# Patient Record
Sex: Female | Born: 1940 | Race: White | Hispanic: No | Smoking: Never smoker
Health system: Southern US, Community
[De-identification: ages and names within clinical notes are randomized; demographics above are authoritative.]

## PROBLEM LIST (undated history)

## (undated) DIAGNOSIS — R112 Nausea with vomiting, unspecified: Secondary | ICD-10-CM

## (undated) DIAGNOSIS — J189 Pneumonia, unspecified organism: Secondary | ICD-10-CM

## (undated) DIAGNOSIS — C50919 Malignant neoplasm of unspecified site of unspecified female breast: Secondary | ICD-10-CM

## (undated) DIAGNOSIS — R32 Unspecified urinary incontinence: Secondary | ICD-10-CM

## (undated) DIAGNOSIS — E039 Hypothyroidism, unspecified: Secondary | ICD-10-CM

## (undated) DIAGNOSIS — J40 Bronchitis, not specified as acute or chronic: Secondary | ICD-10-CM

## (undated) DIAGNOSIS — R040 Epistaxis: Secondary | ICD-10-CM

## (undated) DIAGNOSIS — Z8042 Family history of malignant neoplasm of prostate: Secondary | ICD-10-CM

## (undated) DIAGNOSIS — R0602 Shortness of breath: Secondary | ICD-10-CM

## (undated) DIAGNOSIS — J302 Other seasonal allergic rhinitis: Secondary | ICD-10-CM

## (undated) DIAGNOSIS — Z8051 Family history of malignant neoplasm of kidney: Secondary | ICD-10-CM

## (undated) DIAGNOSIS — K219 Gastro-esophageal reflux disease without esophagitis: Secondary | ICD-10-CM

## (undated) DIAGNOSIS — Z803 Family history of malignant neoplasm of breast: Secondary | ICD-10-CM

## (undated) DIAGNOSIS — Z9889 Other specified postprocedural states: Secondary | ICD-10-CM

## (undated) DIAGNOSIS — E7800 Pure hypercholesterolemia, unspecified: Secondary | ICD-10-CM

## (undated) DIAGNOSIS — E079 Disorder of thyroid, unspecified: Secondary | ICD-10-CM

## (undated) DIAGNOSIS — Z923 Personal history of irradiation: Secondary | ICD-10-CM

## (undated) DIAGNOSIS — Z973 Presence of spectacles and contact lenses: Secondary | ICD-10-CM

## (undated) DIAGNOSIS — Z79810 Long term (current) use of selective estrogen receptor modulators (SERMs): Secondary | ICD-10-CM

## (undated) DIAGNOSIS — I10 Essential (primary) hypertension: Secondary | ICD-10-CM

## (undated) DIAGNOSIS — M7989 Other specified soft tissue disorders: Secondary | ICD-10-CM

## (undated) DIAGNOSIS — M1990 Unspecified osteoarthritis, unspecified site: Secondary | ICD-10-CM

## (undated) HISTORY — DX: Personal history of irradiation: Z92.3

## (undated) HISTORY — DX: Malignant neoplasm of unspecified site of unspecified female breast: C50.919

## (undated) HISTORY — DX: Family history of malignant neoplasm of kidney: Z80.51

## (undated) HISTORY — PX: COLONOSCOPY: SHX174

## (undated) HISTORY — DX: Family history of malignant neoplasm of breast: Z80.3

## (undated) HISTORY — DX: Long term (current) use of selective estrogen receptor modulators (serms): Z79.810

## (undated) HISTORY — PX: OTHER SURGICAL HISTORY: SHX169

## (undated) HISTORY — PX: UPPER GI ENDOSCOPY: SHX6162

## (undated) HISTORY — DX: Family history of malignant neoplasm of prostate: Z80.42

---

## 1975-04-15 HISTORY — PX: ABDOMINAL HYSTERECTOMY: SHX81

## 1995-04-01 HISTORY — PX: NASAL SEPTUM SURGERY: SHX37

## 1996-06-21 HISTORY — PX: CHOLECYSTECTOMY: SHX55

## 2000-09-08 ENCOUNTER — Emergency Department (HOSPITAL_COMMUNITY): Admission: EM | Admit: 2000-09-08 | Discharge: 2000-09-08 | Payer: Self-pay | Admitting: Emergency Medicine

## 2000-09-08 ENCOUNTER — Encounter: Payer: Self-pay | Admitting: Emergency Medicine

## 2001-03-05 ENCOUNTER — Ambulatory Visit (HOSPITAL_COMMUNITY): Admission: RE | Admit: 2001-03-05 | Discharge: 2001-03-05 | Payer: Self-pay | Admitting: Gastroenterology

## 2002-04-11 HISTORY — PX: KNEE ARTHROSCOPY: SUR90

## 2005-03-17 ENCOUNTER — Encounter: Admission: RE | Admit: 2005-03-17 | Discharge: 2005-03-17 | Payer: Self-pay | Admitting: Internal Medicine

## 2005-04-18 ENCOUNTER — Encounter: Admission: RE | Admit: 2005-04-18 | Discharge: 2005-04-18 | Payer: Self-pay | Admitting: Internal Medicine

## 2010-05-10 ENCOUNTER — Other Ambulatory Visit: Payer: Self-pay | Admitting: Gastroenterology

## 2011-04-10 ENCOUNTER — Encounter (INDEPENDENT_AMBULATORY_CARE_PROVIDER_SITE_OTHER): Payer: BLUE CROSS/BLUE SHIELD | Admitting: Cardiology

## 2011-04-10 ENCOUNTER — Other Ambulatory Visit: Payer: Self-pay | Admitting: Cardiology

## 2011-04-10 DIAGNOSIS — M79609 Pain in unspecified limb: Secondary | ICD-10-CM

## 2011-04-10 DIAGNOSIS — R229 Localized swelling, mass and lump, unspecified: Secondary | ICD-10-CM

## 2011-04-10 DIAGNOSIS — M7989 Other specified soft tissue disorders: Secondary | ICD-10-CM

## 2011-07-30 ENCOUNTER — Other Ambulatory Visit: Payer: Self-pay | Admitting: Internal Medicine

## 2011-07-30 DIAGNOSIS — N9489 Other specified conditions associated with female genital organs and menstrual cycle: Secondary | ICD-10-CM

## 2011-07-31 ENCOUNTER — Ambulatory Visit
Admission: RE | Admit: 2011-07-31 | Discharge: 2011-07-31 | Disposition: A | Discharge status: Home / Self Care | Payer: BLUE CROSS/BLUE SHIELD | Source: Ambulatory Visit | Attending: Internal Medicine | Admitting: Internal Medicine

## 2011-07-31 DIAGNOSIS — N9489 Other specified conditions associated with female genital organs and menstrual cycle: Secondary | ICD-10-CM

## 2012-03-18 ENCOUNTER — Encounter (HOSPITAL_COMMUNITY): Payer: Self-pay | Admitting: *Deleted

## 2012-03-18 ENCOUNTER — Emergency Department (HOSPITAL_COMMUNITY)
Admission: EM | Admit: 2012-03-18 | Discharge: 2012-03-18 | Disposition: A | Discharge status: Home / Self Care | Payer: Medicare Other | Attending: Emergency Medicine | Admitting: Emergency Medicine

## 2012-03-18 DIAGNOSIS — R040 Epistaxis: Secondary | ICD-10-CM

## 2012-03-18 DIAGNOSIS — Z79899 Other long term (current) drug therapy: Secondary | ICD-10-CM | POA: Insufficient documentation

## 2012-03-18 DIAGNOSIS — K219 Gastro-esophageal reflux disease without esophagitis: Secondary | ICD-10-CM | POA: Insufficient documentation

## 2012-03-18 DIAGNOSIS — E079 Disorder of thyroid, unspecified: Secondary | ICD-10-CM | POA: Insufficient documentation

## 2012-03-18 DIAGNOSIS — Z794 Long term (current) use of insulin: Secondary | ICD-10-CM | POA: Insufficient documentation

## 2012-03-18 DIAGNOSIS — E7800 Pure hypercholesterolemia, unspecified: Secondary | ICD-10-CM | POA: Insufficient documentation

## 2012-03-18 DIAGNOSIS — J40 Bronchitis, not specified as acute or chronic: Secondary | ICD-10-CM | POA: Insufficient documentation

## 2012-03-18 HISTORY — DX: Gastro-esophageal reflux disease without esophagitis: K21.9

## 2012-03-18 HISTORY — DX: Pure hypercholesterolemia, unspecified: E78.00

## 2012-03-18 HISTORY — DX: Bronchitis, not specified as acute or chronic: J40

## 2012-03-18 HISTORY — DX: Disorder of thyroid, unspecified: E07.9

## 2012-03-18 MED ORDER — PHENYLEPHRINE HCL 0.5 % NA SOLN
1.0000 [drp] | Freq: Once | NASAL | Status: AC
  Administered 2012-03-18: 1 [drp] via NASAL
  Filled 2012-03-18: qty 15

## 2012-03-18 NOTE — ED Notes (Signed)
Pt has nosebleed from right nare. States has been having frequent nosebleeds recently. Pt reports using aspirin 81mg  daily. Family also reports that pt has been coughing and blowing nose frequently. Pt has constant drip of blood from nose.

## 2012-03-18 NOTE — ED Provider Notes (Signed)
History     CSN: [id number]  Arrival date & time 03/18/12  1330   First MD Initiated Contact with Patient 03/18/12 1603      Chief Complaint  Patient presents with  . Epistaxis    HPI  The patient presents with concerns of epistaxis.  The past days she has had multiple events, and today he notes that she had sustained bleeding from her right nares for the past several hours.  She denies other focal complaints.  There's been no relief with pressure.  Past Medical History  Diagnosis Date  . Bronchitis   . Thyroid disease   . Acid reflux disease   . High cholesterol     Past Surgical History  Procedure Date  . Abdominal hysterectomy   . Cholecystectomy   . Upper gi endoscopy   . Colonoscopy     History reviewed. No pertinent family history.  History  Substance Use Topics  . Smoking status: Not on file  . Smokeless tobacco: Not on file  . Alcohol Use: No    OB History    Grav Para Term Preterm Abortions TAB SAB Ect Mult Living                  Review of Systems  All other systems reviewed and are negative.    Allergies  Review of patient's allergies indicates no known allergies.  Home Medications   Current Outpatient Rx  Name  Route  Sig  Dispense  Refill  . ASPIRIN 81 MG PO TABS   Oral   Take 81 mg by mouth daily.         . B COMPLEX PO TABS   Oral   Take 1 tablet by mouth daily.         . BECLOMETHASONE DIPROPIONATE 40 MCG/ACT IN AERS   Inhalation   Inhale 2 puffs into the lungs 2 (two) times daily.         Marland Kitchen DOXYCYCLINE HYCLATE 100 MG PO CAPS   Oral   Take 100 mg by mouth 2 (two) times daily.         Marland Kitchen FLUTICASONE PROPIONATE 50 MCG/ACT NA SUSP   Nasal   Place 2 sprays into the nose daily.         Marland Kitchen GABAPENTIN 100 MG PO CAPS   Oral   Take 100 mg by mouth as needed. Nerve pain         . LEVOTHYROXINE SODIUM 75 MCG PO TABS   Oral   Take 75 mcg by mouth daily.         Marland Kitchen LORATADINE 10 MG PO TABS   Oral   Take 10 mg by  mouth daily.         Marland Kitchen PANTOPRAZOLE SODIUM 40 MG PO TBEC   Oral   Take 40 mg by mouth daily.         Marland Kitchen PREDNISONE (PAK) 10 MG PO TABS   Oral   Take by mouth daily.         Marland Kitchen ROSUVASTATIN CALCIUM 10 MG PO TABS   Oral   Take 10 mg by mouth daily.         Marland Kitchen VALACYCLOVIR HCL 500 MG PO TABS   Oral   Take 500 mg by mouth 2 (two) times daily.         Marland Kitchen VITAMIN D (ERGOCALCIFEROL) 50000 UNITS PO CAPS   Oral   Take 50,000 Units by mouth every 7 (seven) days.  Pt takes on Wed           BP 171/94  Pulse 92  Temp 97.3 F (36.3 C) (Oral)  Resp 18  SpO2 100%  Physical Exam  Nursing note and vitals reviewed. Constitutional: She is oriented to person, place, and time. She appears well-developed and well-nourished. No distress.  HENT:  Head: Normocephalic and atraumatic.  Nose:    Eyes: Conjunctivae normal and EOM are normal.  Cardiovascular: Normal rate and regular rhythm.   Pulmonary/Chest: Effort normal and breath sounds normal. No stridor. No respiratory distress.  Abdominal: She exhibits no distension.  Musculoskeletal: She exhibits no edema.  Neurological: She is alert and oriented to person, place, and time. No cranial nerve deficit.  Skin: Skin is warm and dry.  Psychiatric: She has a normal mood and affect.    ED Course  EPISTAXIS MANAGEMENT Date/Time: 03/18/2012 6:10 PM Performed by: Gerhard Munch Authorized by: Gerhard Munch Consent: Verbal consent obtained. The procedure was performed in an emergent situation. Risks and benefits: risks, benefits and alternatives were discussed Consent given by: patient Patient identity confirmed: verbally with patient Time out: Immediately prior to procedure a "time out" was called to verify the correct patient, procedure, equipment, support staff and site/side marked as required. Preparation: Patient was prepped and draped in the usual sterile fashion. Patient sedated: no Treatment site: right Kiesselbach's  area Repair method: silver nitrate (pseudoephedrine) Post-procedure assessment: bleeding stopped Treatment complexity: simple Patient tolerance: Patient tolerated the procedure well with no immediate complications.   (including critical care time)  Labs Reviewed - No data to display No results found.   No diagnosis found.  5:24 PM After an initial packing w pseudoephedrine, bleeding was persistent.  Silver nitrate slowed the bleeding, but did not stop it.  Additional packing applied  6:10 PM On removal of packing there is no active bleeding.    MDM  This generally well F p/w epistaxis.  Following treatment bleeding stopped.  No additional complaints.  Return precautions and ENT followup discussed.   Gerhard Munch, MD 03/18/12 [phone]

## 2012-06-24 ENCOUNTER — Other Ambulatory Visit: Payer: Self-pay | Admitting: Radiology

## 2012-06-24 DIAGNOSIS — C50919 Malignant neoplasm of unspecified site of unspecified female breast: Secondary | ICD-10-CM

## 2012-06-24 HISTORY — DX: Malignant neoplasm of unspecified site of unspecified female breast: C50.919

## 2012-06-28 ENCOUNTER — Other Ambulatory Visit: Payer: Self-pay | Admitting: Radiology

## 2012-06-28 DIAGNOSIS — D0501 Lobular carcinoma in situ of right breast: Secondary | ICD-10-CM

## 2012-07-01 ENCOUNTER — Telehealth: Payer: Self-pay | Admitting: *Deleted

## 2012-07-01 DIAGNOSIS — C50311 Malignant neoplasm of lower-inner quadrant of right female breast: Secondary | ICD-10-CM

## 2012-07-01 NOTE — Telephone Encounter (Signed)
Confirmed [facility] for 07/07/12 at 1200 .  Instructions and contact information given.

## 2012-07-02 ENCOUNTER — Ambulatory Visit
Admission: RE | Admit: 2012-07-02 | Discharge: 2012-07-02 | Disposition: A | Discharge status: Home / Self Care | Payer: Medicare Other | Source: Ambulatory Visit | Attending: Radiology | Admitting: Radiology

## 2012-07-02 DIAGNOSIS — D0501 Lobular carcinoma in situ of right breast: Secondary | ICD-10-CM

## 2012-07-02 MED ORDER — GADOBENATE DIMEGLUMINE 529 MG/ML IV SOLN
15.0000 mL | Freq: Once | INTRAVENOUS | Status: AC | PRN
  Administered 2012-07-02: 15 mL via INTRAVENOUS

## 2012-07-07 ENCOUNTER — Encounter: Payer: Self-pay | Admitting: *Deleted

## 2012-07-07 ENCOUNTER — Ambulatory Visit
Admission: RE | Admit: 2012-07-07 | Discharge: 2012-07-07 | Disposition: A | Discharge status: Home / Self Care | Payer: Medicare Other | Source: Ambulatory Visit | Attending: Radiation Oncology | Admitting: Radiation Oncology

## 2012-07-07 ENCOUNTER — Ambulatory Visit: Payer: Medicare Other | Attending: General Surgery | Admitting: Physical Therapy

## 2012-07-07 ENCOUNTER — Ambulatory Visit (HOSPITAL_BASED_OUTPATIENT_CLINIC_OR_DEPARTMENT_OTHER): Payer: Medicare Other | Admitting: General Surgery

## 2012-07-07 ENCOUNTER — Encounter: Payer: Self-pay | Admitting: Oncology

## 2012-07-07 ENCOUNTER — Ambulatory Visit: Payer: Medicare Other

## 2012-07-07 ENCOUNTER — Ambulatory Visit (HOSPITAL_BASED_OUTPATIENT_CLINIC_OR_DEPARTMENT_OTHER): Payer: Medicare Other | Admitting: Oncology

## 2012-07-07 ENCOUNTER — Encounter: Payer: Self-pay | Admitting: Radiation Oncology

## 2012-07-07 ENCOUNTER — Other Ambulatory Visit (HOSPITAL_BASED_OUTPATIENT_CLINIC_OR_DEPARTMENT_OTHER): Payer: Medicare Other

## 2012-07-07 VITALS — BP 169/78 | HR 83 | Temp 97.9°F | Resp 20 | Ht 62.0 in | Wt 161.0 lb

## 2012-07-07 DIAGNOSIS — C50919 Malignant neoplasm of unspecified site of unspecified female breast: Secondary | ICD-10-CM | POA: Insufficient documentation

## 2012-07-07 DIAGNOSIS — C50311 Malignant neoplasm of lower-inner quadrant of right female breast: Secondary | ICD-10-CM

## 2012-07-07 DIAGNOSIS — IMO0001 Reserved for inherently not codable concepts without codable children: Secondary | ICD-10-CM | POA: Insufficient documentation

## 2012-07-07 DIAGNOSIS — Z170 Estrogen receptor positive status [ER+]: Secondary | ICD-10-CM

## 2012-07-07 DIAGNOSIS — C50319 Malignant neoplasm of lower-inner quadrant of unspecified female breast: Secondary | ICD-10-CM

## 2012-07-07 DIAGNOSIS — R293 Abnormal posture: Secondary | ICD-10-CM | POA: Insufficient documentation

## 2012-07-07 DIAGNOSIS — C50911 Malignant neoplasm of unspecified site of right female breast: Secondary | ICD-10-CM

## 2012-07-07 LAB — COMPREHENSIVE METABOLIC PANEL (CC13)
ALT: 28 U/L (ref 0–55)
AST: 33 U/L (ref 5–34)
Albumin: 3.8 g/dL (ref 3.5–5.0)
Alkaline Phosphatase: 54 U/L (ref 40–150)
Calcium: 10 mg/dL (ref 8.4–10.4)
Chloride: 105 mEq/L (ref 98–107)
Potassium: 4.3 mEq/L (ref 3.5–5.1)
Sodium: 141 mEq/L (ref 136–145)

## 2012-07-07 LAB — CBC WITH DIFFERENTIAL/PLATELET
BASO%: 1.3 % (ref 0.0–2.0)
EOS%: 2.1 % (ref 0.0–7.0)
HGB: 14.6 g/dL (ref 11.6–15.9)
MCH: 31.5 pg (ref 25.1–34.0)
MCHC: 34.2 g/dL (ref 31.5–36.0)
RBC: 4.65 10*6/uL (ref 3.70–5.45)
RDW: 13.5 % (ref 11.2–14.5)
lymph#: 3 10*3/uL (ref 0.9–3.3)

## 2012-07-07 NOTE — Progress Notes (Signed)
Shelia Obrien [id number] 09-04-1940 72 y.o. 07/07/2012 3:19 PM  CC  [name], MD [address] [place] [address] Dr. Almond Lint Dr. Antony Blackbird  REASON FOR CONSULTATION:  72 year old female with new diagnosis of stage I invasive breast cancer of the right breast in the lower inner quadrant.  STAGE:   Cancer of lower-inner quadrant of female breast   Primary site: Breast (Right)   Staging method: AJCC 7th Edition   Clinical: Stage IA (T1c, N0, cM0)   Summary: Stage IA (T1c, N0, cM0)  REFERRING PHYSICIAN: Dr. Everardo Beals  HISTORY OF PRESENT ILLNESS:  Shelia Obrien is a 72 y.o. female. who is seen as part of the multidisciplinary breast clinic. The patient presented recently with some architectural distortion noted on a screening mammogram. An ultrasound of this area was performed which revealed a 1.2 cm mass in the 4:00 position of the right breast. An MRI was performed which is documented below and also confirmed a solitary lesion which measured 1.6 cm. 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 The tumor was estrogen receptor positive at 93% and progesterone receptor positive at 7%. Ki 67 was low at 12%. There was no HER-2/neu amplification.patient's case was discussed at the multidisciplinary breast conference. Her radiology and pathology were reviewed. She is seen today in collaboration with Dr. Everardo Beals and Dr. Antony Blackbird.   Past Medical History: Past Medical History  Diagnosis Date  . Bronchitis   . Thyroid disease   . Acid reflux disease   . High cholesterol   . Breast cancer     Past Surgical History: Past Surgical History  Procedure Laterality Date  . Abdominal hysterectomy    . Cholecystectomy    . Upper gi endoscopy    . Colonoscopy      Family History: Family History  Problem Relation Age of Onset  . Prostate cancer Father   . Breast cancer Maternal Aunt   . Lung cancer  Maternal Uncle   . Lung cancer Maternal Grandmother   . Kidney cancer Maternal Grandmother     Social History History  Substance Use Topics  . Smoking status: Never Smoker   . Smokeless tobacco: Not on file  . Alcohol Use: No    Allergies: No Known Allergies  Current Medications: Current Outpatient Prescriptions  Medication Sig Dispense Refill  . b complex vitamins tablet Take 1 tablet by mouth daily.      Marland Kitchen estradiol (CLIMARA - DOSED IN MG/24 HR) 0.0375 mg/24hr Place 1 patch onto the skin once a week.      . fluticasone (FLONASE) 50 MCG/ACT nasal spray Place 2 sprays into the nose daily.      Marland Kitchen gabapentin (NEURONTIN) 100 MG capsule Take 100 mg by mouth as needed. Nerve pain      . levothyroxine (SYNTHROID, LEVOTHROID) 75 MCG tablet Take 75 mcg by mouth daily.      . pantoprazole (PROTONIX) 40 MG tablet Take 40 mg by mouth daily.      . rosuvastatin (CRESTOR) 10 MG tablet Take 10 mg by mouth daily.      . valACYclovir (VALTREX) 500 MG tablet Take 1 g by mouth as needed.       Marland Kitchen aspirin 81 MG tablet Take 81 mg by mouth daily.      . beclomethasone (QVAR) 40 MCG/ACT inhaler Inhale 2 puffs into the lungs 2 (two) times daily.      Marland Kitchen  doxycycline (VIBRAMYCIN) 100 MG capsule Take 100 mg by mouth 2 (two) times daily.      Marland Kitchen loratadine (CLARITIN) 10 MG tablet Take 10 mg by mouth daily.      . predniSONE (STERAPRED UNI-PAK) 10 MG tablet Take by mouth daily.      . Vitamin D, Ergocalciferol, (DRISDOL) 50000 UNITS CAPS Take 50,000 Units by mouth every 7 (seven) days. Pt takes on Wed       No current facility-administered medications for this visit.    OB/GYN History:menarche age 37 she is postmenopausal she had been on home on replacement therapy for 20 years on date of visit she was on them we recommended she discontinue both estradiol as well as Premarin.she's had to live births first live birth was at 89  Fertility Discussion:n/a Prior History of Cancer: none  Health  Maintenance:  Colonoscopy yes Bone Density yes  Last PAP smear unknown  ECOG PERFORMANCE STATUS: 0 - Asymptomatic  Genetic Counseling/testing: no  REVIEW OF SYSTEMS: Comprehensive review of systems is scanned separately   PHYSICAL EXAMINATION: Blood pressure 169/78, pulse 83, temperature 97.9 F (36.6 C), temperature source Oral, resp. rate 20, height 5\' 2"  (1.575 m), weight 161 lb (73.029 kg). Patient is well-developed well-nourished female in no acute distress she is accompanied by her son and daughter as well as a friend. HEENT exam EOMI PERRLA sclerae anicteric no conjunctival pallor oral mucosa is moist neck is supple lungs are clear bilaterally to auscultation and percussion cardiovascular is regular rate rhythm abdomen is soft nontender nondistended bowel sounds are present no HSM extremities no edema neuro patient's alert oriented otherwise nonfocal  Breast examination: Left breast no masses or nipple discharge right breast reveals mild bruising in the lower inner quadrant of the breast area some thick mucus noted but no discrete mass is palpated.  STUDIES/RESULTS: Mr Breast Bilateral W Wo Contrast  07/02/2012  *RADIOLOGY REPORT*  Clinical Data: Recently diagnosed invasive lobular carcinoma in the lower inner quadrant of the right breast.  Tiny masses suggested in the outer mid right breast on recent breast tomosynthesis images. No corresponding abnormality seen on ultrasound.  BUN and creatinine were obtained on site at [facility] at [address] Results:  BUN 14 mg/dL,  Creatinine 1.0 mg/dL.  BILATERAL BREAST MRI WITH AND WITHOUT CONTRAST  Technique: Multiplanar, multisequence MR images of both breasts were obtained prior to and following the intravenous administration of 15ml of MultiHance.  Three dimensional images were evaluated at the independent DynaCad workstation.  Comparison:  Recent mammogram, ultrasound and biopsy examinations at [facility].   Findings: Mild to moderate background parenchymal enhancement in both breasts.  1.6 x 1.2 x 0.8 cm rounded, irregularly marginated enhancing mass in the lower inner quadrant of the left breast with an adjacent metallic clip artifact.  This corresponds to the recently biopsied invasive lobular carcinoma.  This has plateau enhancement kinetics.  No additional masses or areas of enhancement suspicious for malignancy in either breast.  No abnormal appearing lymph nodes.  IMPRESSION:  1.  1.6 x 1.2 x 0.8 cm biopsy-proven invasive lobular carcinoma in the lower inner quadrant of the right breast. 2.  Otherwise, unremarkable examination.  RECOMMENDATION: Treatment plan.  THREE-DIMENSIONAL MR IMAGE RENDERING ON INDEPENDENT WORKSTATION:  Three-dimensional MR images were rendered by post-processing of the original MR data on an independent workstation.  The three- dimensional MR images were interpreted, and findings were reported in the accompanying complete MRI report for this study.  BI-RADS CATEGORY 6:  Known biopsy-proven malignancy - appropriate action should be taken.   Original Report Authenticated By: Beckie Salts, M.D.      LABS:    Chemistry   No results found for this basename: NA, K, CL, CO2, BUN, CREATININE, GLU   No results found for this basename: CALCIUM, ALKPHOS, AST, ALT, BILITOT      Lab Results  Component Value Date   WBC 6.3 07/07/2012   HGB 14.6 07/07/2012   HCT 42.7 07/07/2012   MCV 91.9 07/07/2012   PLT 247 07/07/2012   ASSESSMENT    72 year old female with  #1 new diagnosis of invasive lobular carcinoma of the right breast which is ER positive PR positive HER-2/neu negative clinical stage I.patient is seen in the multidisciplinary breast clinic for discussion of treatment options. She was seen by Dr. Everardo Beals who thinks that she is a good candidate for a right lumpectomy with sentinel lymph node biopsy. Patient desires breast conservation synthesis a good option for her.  #2  since patient's tumor is ER positive PR positive she certainly would be a candidate for antiestrogen therapy. However whether or not she needs chemotherapy will depend on Oncotype DX testing. I have recommended this for her. We also discussed the rationale and I gave her significant information for Oncotype testing.  #3 should patient be able to undergo breast conservation she will need radiation therapy and she was seen by the radiation oncologist today as well.     PLAN:    #1 patient will proceed with her lumpectomy and sentinel lymph node biopsy initially.  #2 we will send off Oncotype DX testing on her final pathology.  #3 I will plan on seeing her back after her surgery to      Thank you so much for allowing me to participate in the care of [name]. I will continue to follow up the patient with you and assist in her care.  All questions were answered. The patient knows to call the clinic with any problems, questions or concerns. We can certainly see the patient much sooner if necessary.  I spent 60 minutes counseling the patient face to face. The total time spent in the appointment was 60 minutes.   Drue Second, MD Medical/Oncology [name] [phone] (beeper) [phone] (Office)  07/07/2012, 3:19 PM

## 2012-07-07 NOTE — Progress Notes (Signed)
Radiation Oncology         ([phone]) [phone] ________________________________  Initial outpatient Consultation  Name: Shelia Obrien MRN: [id number]  Date: 07/07/2012  DOB: 10-21-1940  WU:[id number],YNWG A, MD  Almond Lint, MD   REFERRING PHYSICIAN: Almond Lint, MD  DIAGNOSIS: The encounter diagnosis was Cancer of lower-inner quadrant of female breast, right.  HISTORY OF PRESENT ILLNESS::Shelia Obrien is a 72 y.o. female who is seen out courtesy of Dr. Donell Obrien as part of the multidisciplinary breast clinic. The patient presented recently with some architectural distortion noted on a screening mammogram. An ultrasound of this area was performed which revealed a 1.2 cm mass in the 4:00 position of the right breast. An MRI was performed which is documented below and also confirmed a solitary lesion which measured 1.6 cm. 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 The tumor was estrogen receptor positive at 93% and progesterone receptor positive at 7%. Ki 67 was low at 12%. There was no HER-2/neu amplification. With these findings the patient is now seen in consultation.   PREVIOUS RADIATION THERAPY: No  PAST MEDICAL HISTORY:  has a past medical history of Bronchitis; Thyroid disease; Acid reflux disease; High cholesterol; and Breast cancer.    PAST SURGICAL HISTORY: Past Surgical History  Procedure Laterality Date  . Abdominal hysterectomy    . Cholecystectomy    . Upper gi endoscopy    . Colonoscopy      FAMILY HISTORY: family history includes Breast cancer in her maternal aunt; Kidney cancer in her maternal grandmother; Lung cancer in her maternal grandmother and maternal uncle; and Prostate cancer in her father.  SOCIAL HISTORY:  reports that she has never smoked. She does not have any smokeless tobacco history on file. She reports that she does not drink alcohol or use illicit drugs.  ALLERGIES: Review of patient's  allergies indicates no known allergies.  MEDICATIONS:  Current Outpatient Prescriptions  Medication Sig Dispense Refill  . aspirin 81 MG tablet Take 81 mg by mouth daily.      Marland Kitchen b complex vitamins tablet Take 1 tablet by mouth daily.      . beclomethasone (QVAR) 40 MCG/ACT inhaler Inhale 2 puffs into the lungs 2 (two) times daily.      Marland Kitchen doxycycline (VIBRAMYCIN) 100 MG capsule Take 100 mg by mouth 2 (two) times daily.      Marland Kitchen estradiol (CLIMARA - DOSED IN MG/24 HR) 0.0375 mg/24hr Place 1 patch onto the skin once a week.      . fluticasone (FLONASE) 50 MCG/ACT nasal spray Place 2 sprays into the nose daily.      Marland Kitchen gabapentin (NEURONTIN) 100 MG capsule Take 100 mg by mouth as needed. Nerve pain      . levothyroxine (SYNTHROID, LEVOTHROID) 75 MCG tablet Take 75 mcg by mouth daily.      Marland Kitchen loratadine (CLARITIN) 10 MG tablet Take 10 mg by mouth daily.      . pantoprazole (PROTONIX) 40 MG tablet Take 40 mg by mouth daily.      . predniSONE (STERAPRED UNI-PAK) 10 MG tablet Take by mouth daily.      . rosuvastatin (CRESTOR) 10 MG tablet Take 10 mg by mouth daily.      . valACYclovir (VALTREX) 500 MG tablet Take 1 g by mouth as needed.       . Vitamin D, Ergocalciferol, (DRISDOL) 50000 UNITS CAPS Take 50,000 Units by mouth every 7 (seven)  days. Pt takes on Wed       No current facility-administered medications for this encounter.    REVIEW OF SYSTEMS:  A 15 point review of systems is documented in the electronic medical record. This was obtained by the nursing staff. However, I reviewed this with the patient to discuss relevant findings and make appropriate changes.  Prior to diagnosis the patient denied any pain in the breast area nipple discharge or bleeding. She has been unable to palpate a mass in her breast area.   PHYSICAL EXAM: This is a very pleasant 72 year old female in no acute distress. She is accompanied by her son and daughter as well as a good friend on evaluation today. Examination of  the neck and supraclavicular region reveals no evidence of adenopathy. The axillary areas are free of adenopathy. Examination of the lungs reveals them to be clear. The heart has a regular rhythm and rate. The abdomen is soft and nontender with normal bowel sounds. The neurologic exam is nonfocal. Examination of the left breast reveals no mass or nipple discharge. Examination of the right breast area reveals some mild bruising in the lower inner quadrant of the breast area. There is some thickening noted in this area but no discrete mass is palpable.   LABORATORY DATA:  Lab Results  Component Value Date   WBC 6.3 07/07/2012   HGB 14.6 07/07/2012   HCT 42.7 07/07/2012   MCV 91.9 07/07/2012   PLT 247 07/07/2012   No results found for this basename: NA, K, CL, CO2   No results found for this basename: ALT, AST, GGT, ALKPHOS, BILITOT     RADIOGRAPHY: Mr Breast Bilateral W Wo Contrast  07/02/2012  *RADIOLOGY REPORT*  Clinical Data: Recently diagnosed invasive lobular carcinoma in the lower inner quadrant of the right breast.  Tiny masses suggested in the outer mid right breast on recent breast tomosynthesis images. No corresponding abnormality seen on ultrasound.  BUN and creatinine were obtained on site at [facility] at [address] Results:  BUN 14 mg/dL,  Creatinine 1.0 mg/dL.  BILATERAL BREAST MRI WITH AND WITHOUT CONTRAST  Technique: Multiplanar, multisequence MR images of both breasts were obtained prior to and following the intravenous administration of 15ml of MultiHance.  Three dimensional images were evaluated at the independent DynaCad workstation.  Comparison:  Recent mammogram, ultrasound and biopsy examinations at [facility].  Findings: Mild to moderate background parenchymal enhancement in both breasts.  1.6 x 1.2 x 0.8 cm rounded, irregularly marginated enhancing mass in the lower inner quadrant of the left breast with an adjacent metallic clip artifact.  This  corresponds to the recently biopsied invasive lobular carcinoma.  This has plateau enhancement kinetics.  No additional masses or areas of enhancement suspicious for malignancy in either breast.  No abnormal appearing lymph nodes.  IMPRESSION:  1.  1.6 x 1.2 x 0.8 cm biopsy-proven invasive lobular carcinoma in the lower inner quadrant of the right breast. 2.  Otherwise, unremarkable examination.  RECOMMENDATION: Treatment plan.  THREE-DIMENSIONAL MR IMAGE RENDERING ON INDEPENDENT WORKSTATION:  Three-dimensional MR images were rendered by post-processing of the original MR data on an independent workstation.  The three- dimensional MR images were interpreted, and findings were reported in the accompanying complete MRI report for this study.  BI-RADS CATEGORY 6:  Known biopsy-proven malignancy - appropriate action should be taken.   Original Report Authenticated By: Beckie Salts, M.D.       IMPRESSION: Clinical stage I invasive lobular  carcinoma of the right breast. Patient would be an excellent candidate for breast conserving therapy. Depending on pathologic results she will likely benefit from radiation therapy.  PLAN: The patient will proceed with needle localized lumpectomy and sentinel node procedure under the direction of Dr. Donell Obrien. The patient will be seen in the postoperative period to discuss  radiation therapy as part of her overall management.  I spent 40 minutes minutes face to face with the patient and more than 50% of that time was spent in counseling and/or coordination of care.   ------------------------------------------------  -----------------------------------  Billie Lade, PhD, MD

## 2012-07-07 NOTE — Progress Notes (Signed)
Checked in new pt with no financial concerns. °

## 2012-07-07 NOTE — Assessment & Plan Note (Signed)
Pt is a 72 yo F with cT1N0Mx right breast cancer.    She is good candidate for breast conservation.   Will schedule for right needle loc lumpectomy and sentinel lymph node biopsy.    The surgical procedure was described to the patient.  I discussed the incision type and location and that we would need radiology involved on the day of surgery with a wire marker and sentinel node.      The risks and benefits of the procedure were described to the patient and he/she wishes to proceed.    We discussed the risks bleeding, infection, damage to other structures, need for further procedures/surgeries.  We discussed the risk of seroma.  The patient was advised if the area in the breast in cancer, we may need to go back to surgery for additional tissue to obtain negative margins or for a lymph node biopsy. The patient was advised that these are the most common complications, but that others can occur as well.  They were advised against taking aspirin or other anti-inflammatory agents/blood thinners the week before surgery.    30 min spent in counseling with pt.

## 2012-07-07 NOTE — Progress Notes (Signed)
Chief complaint:  Right breast cancer  HISTORY: Pt is a 72 yo F who presents with screening detected architectural distortion.  Her diagnostic mammo and ultrasound demonstrated a 1.2 cm mass at 4 o'clock.  MR demonstrated 1.6 cm mass.  She is unable to palpate mass.  She had a maternal aunt with breast cancer.  She had menarche at age 49.  She is no longer having periods. She had hysterectomy.   She had 2 children, and used hormone contraception for 14 years.  She is up to date on screening.  She had not had breast biopsy in past.  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    Past Medical History  Diagnosis Date  . Bronchitis   . Thyroid disease   . Acid reflux disease   . High cholesterol   . Breast cancer     Past Surgical History  Procedure Laterality Date  . Abdominal hysterectomy    . Cholecystectomy    . Upper gi endoscopy    . Colonoscopy      Current Outpatient Prescriptions  Medication Sig Dispense Refill  . aspirin 81 MG tablet Take 81 mg by mouth daily.      Marland Kitchen b complex vitamins tablet Take 1 tablet by mouth daily.      . beclomethasone (QVAR) 40 MCG/ACT inhaler Inhale 2 puffs into the lungs 2 (two) times daily.      Marland Kitchen doxycycline (VIBRAMYCIN) 100 MG capsule Take 100 mg by mouth 2 (two) times daily.      Marland Kitchen estradiol (CLIMARA - DOSED IN MG/24 HR) 0.0375 mg/24hr Place 1 patch onto the skin once a week.      . fluticasone (FLONASE) 50 MCG/ACT nasal spray Place 2 sprays into the nose daily.      Marland Kitchen gabapentin (NEURONTIN) 100 MG capsule Take 100 mg by mouth as needed. Nerve pain      . levothyroxine (SYNTHROID, LEVOTHROID) 75 MCG tablet Take 75 mcg by mouth daily.      Marland Kitchen loratadine (CLARITIN) 10 MG tablet Take 10 mg by mouth daily.      . pantoprazole (PROTONIX) 40 MG tablet Take 40 mg by mouth daily.      . predniSONE (STERAPRED UNI-PAK) 10 MG tablet Take by mouth daily.      . rosuvastatin (CRESTOR) 10 MG tablet Take 10 mg by mouth daily.       . valACYclovir (VALTREX) 500 MG tablet Take 1 g by mouth as needed.       . Vitamin D, Ergocalciferol, (DRISDOL) 50000 UNITS CAPS Take 50,000 Units by mouth every 7 (seven) days. Pt takes on Wed       No current facility-administered medications for this visit.     No Known Allergies   Family History  Problem Relation Age of Onset  . Prostate cancer Father   . Breast cancer Maternal Aunt   . Lung cancer Maternal Uncle   . Lung cancer Maternal Grandmother   . Kidney cancer Maternal Grandmother      History   Social History  . Marital Status: Widowed    Spouse Name: N/A    Number of Children: N/A  . Years of Education: N/A   Social History Main Topics  . Smoking status: Never Smoker   . Smokeless tobacco: Not on file  . Alcohol Use: No  . Drug Use: No  . Sexually Active: Not on file   Other Topics Concern  . Not on file  Social History Narrative  . No narrative on file     REVIEW OF SYSTEMS - PERTINENT POSITIVES ONLY: 12 point review of systems negative other than HPI and PMH except for back pain, sinus problems, cough, incontinence, easy bruising, hot flashes.    EXAM: Wt Readings from Last 3 Encounters:  07/07/12 161 lb (73.029 kg)   Temp Readings from Last 3 Encounters:  07/07/12 97.9 F (36.6 C) Oral  03/18/12 97.3 F (36.3 C) Oral   BP Readings from Last 3 Encounters:  07/07/12 169/78  03/18/12 171/94   Pulse Readings from Last 3 Encounters:  07/07/12 83  03/18/12 92      Gen:  No acute distress.  Well nourished and well groomed.   Neurological: Alert and oriented to person, place, and time. Coordination normal.  Head: Normocephalic and atraumatic.  Eyes: Conjunctivae are normal. Pupils are equal, round, and reactive to light. No scleral icterus.  Neck: Normal range of motion. Neck supple. No tracheal deviation or thyromegaly present.  Cardiovascular: Normal rate, regular rhythm, normal heart sounds and intact distal pulses.  Exam reveals  no gallop and no friction rub.  No murmur heard. Breast:  Breasts are symmetric bilaterally.  Slightly ptotic. No palpable masses or lymphadenopathy.  No skin dimpling or nipple retraction.   Respiratory: Effort normal.  No respiratory distress. No chest wall tenderness. Breath sounds normal.  No wheezes, rales or rhonchi.  GI: Soft. Bowel sounds are normal. The abdomen is soft and nontender.  There is no rebound and no guarding.  Musculoskeletal: Normal range of motion. Extremities are nontender.  Lymphadenopathy: No cervical, preauricular, postauricular or axillary adenopathy is present Skin: Skin is warm and dry. No rash noted. No diaphoresis. No erythema. No pallor. No clubbing, cyanosis, or edema.   Psychiatric: Normal mood and affect. Behavior is normal. Judgment and thought content normal.    LABORATORY RESULTS: Available labs are reviewed  CBC normal.   RADIOLOGY RESULTS: See E-Chart or I-Site for most recent results.  Images and reports are reviewed.  MR IMPRESSION:  1. 1.6 x 1.2 x 0.8 cm biopsy-proven invasive lobular carcinoma in  the lower inner quadrant of the right breast.  2. Otherwise, unremarkable examination.     ASSESSMENT AND PLAN: Cancer of lower-inner quadrant of female breast Pt is a 73 yo F with cT1N0Mx right breast cancer.    She is good candidate for breast conservation.   Will schedule for right needle loc lumpectomy and sentinel lymph node biopsy.    The surgical procedure was described to the patient.  I discussed the incision type and location and that we would need radiology involved on the day of surgery with a wire marker and sentinel node.      The risks and benefits of the procedure were described to the patient and he/she wishes to proceed.    We discussed the risks bleeding, infection, damage to other structures, need for further procedures/surgeries.  We discussed the risk of seroma.  The patient was advised if the area in the breast in  cancer, we may need to go back to surgery for additional tissue to obtain negative margins or for a lymph node biopsy. The patient was advised that these are the most common complications, but that others can occur as well.  They were advised against taking aspirin or other anti-inflammatory agents/blood thinners the week before surgery.    45 min spent with visit, more than 50% in counseling.  Maudry Diego MD Surgical Oncology, General and Endocrine Surgery [facility]      Visit Diagnoses: 1. Breast cancer, right   2. Cancer of lower-inner quadrant of female breast, right     Primary Care Physician: [name], MD

## 2012-07-07 NOTE — Patient Instructions (Signed)
We discussed your pathology and radiology  Please come off your hormone replacement therapy  I will see you back in 1 month

## 2012-07-08 LAB — CANCER ANTIGEN 27.29: CA 27.29: 45 U/mL — ABNORMAL HIGH (ref 0–39)

## 2012-07-09 ENCOUNTER — Encounter (HOSPITAL_COMMUNITY): Payer: Self-pay | Admitting: Pharmacy Technician

## 2012-07-12 ENCOUNTER — Telehealth: Payer: Self-pay | Admitting: *Deleted

## 2012-07-12 ENCOUNTER — Encounter: Payer: Self-pay | Admitting: *Deleted

## 2012-07-12 NOTE — Telephone Encounter (Signed)
Called pt to r/s appt with Dr. Welton Flakes.  Confirmed new appt date and time for 08/13/12 at 12:30.

## 2012-07-12 NOTE — Telephone Encounter (Signed)
Spoke to pt concerning [facility] from 07/07/12.  Pt denies questions or concern regarding dx or treatment care plan.  Confirmed surgery date and f/u appt with Dr. Welton Flakes on 08/08/12 at 11:00.  Encourage pt to call with needs.  Received verbal understanding.  Contact information given.

## 2012-07-13 ENCOUNTER — Encounter: Payer: Self-pay | Admitting: *Deleted

## 2012-07-13 ENCOUNTER — Telehealth: Payer: Self-pay | Admitting: Oncology

## 2012-07-13 NOTE — Progress Notes (Signed)
[facility] Psychosocial Distress Screening Clinical Social Work  Patient completed distress screening protocol and scored a 1 on the Psychosocial Distress Thermometer which indicates mild distress. Clinical Social Worker met with pt in [facility] to assess for distress and other psychosocial needs.  Pt stated she was doing well and felt confident in her treatment plan.  CSW encouraged pt to call with any questions or concerns.    Tamala Julian, MSW, LCSW Clinical Social Worker [facility] [phone]

## 2012-07-13 NOTE — Telephone Encounter (Signed)
Per Dawn disregard 3/31 pof she has taken care of f/u. Pt scheduled for 5/2 by Dawn.

## 2012-07-16 ENCOUNTER — Encounter (HOSPITAL_COMMUNITY): Payer: Self-pay

## 2012-07-16 ENCOUNTER — Encounter (HOSPITAL_COMMUNITY)
Admission: RE | Admit: 2012-07-16 | Discharge: 2012-07-16 | Disposition: A | Discharge status: Home / Self Care | Payer: Medicare Other | Source: Ambulatory Visit | Attending: General Surgery | Admitting: General Surgery

## 2012-07-16 HISTORY — DX: Other seasonal allergic rhinitis: J30.2

## 2012-07-16 HISTORY — DX: Hypothyroidism, unspecified: E03.9

## 2012-07-16 HISTORY — DX: Epistaxis: R04.0

## 2012-07-16 HISTORY — DX: Unspecified urinary incontinence: R32

## 2012-07-16 LAB — BASIC METABOLIC PANEL
CO2: 24 mEq/L (ref 19–32)
Calcium: 9.2 mg/dL (ref 8.4–10.5)
Glucose, Bld: 87 mg/dL (ref 70–99)
Potassium: 4.8 mEq/L (ref 3.5–5.1)
Sodium: 140 mEq/L (ref 135–145)

## 2012-07-16 LAB — CBC
Hemoglobin: 14 g/dL (ref 12.0–15.0)
MCV: 89.3 fL (ref 78.0–100.0)
Platelets: 194 10*3/uL (ref 150–400)
RBC: 4.49 MIL/uL (ref 3.87–5.11)
WBC: 4.5 10*3/uL (ref 4.0–10.5)

## 2012-07-16 LAB — SURGICAL PCR SCREEN: Staphylococcus aureus: POSITIVE — AB

## 2012-07-16 NOTE — Pre-Procedure Instructions (Signed)
[name]  07/16/2012   Your procedure is scheduled on:  Thurs, April 10 @ 1:45 PM  Report to [facility] at 11:45 AM.  Call this number if you have problems the morning of surgery: [phone]   Remember:   Do not eat food or drink liquids after midnight.   Take these medicines the morning of surgery with A SIP OF WATER: QVAR(Beclomethasone),Gabapentin(Neurontin),Synthroid(Levothyroxine),and Pantoprazole(Protonix)   Do not wear jewelry, make-up or nail polish.  Do not wear lotions, powders, or perfumes. You may wear deodorant.  Do not shave 48 hours prior to surgery. .  Do not bring valuables to the hospital.  Contacts, dentures or bridgework may not be worn into surgery.  Leave suitcase in the car. After surgery it may be brought to your room.  For patients admitted to the hospital, checkout time is 11:00 AM the day of  discharge.   Patients discharged the day of surgery will not be allowed to drive  home.    Special Instructions: Shower using CHG 2 nights before surgery and the night before surgery.  If you shower the day of surgery use CHG.  Use special wash - you have one bottle of CHG for all showers.  You should use approximately 1/3 of the bottle for each shower.   Please read over the following fact sheets that you were given: Pain Booklet, Coughing and Deep Breathing, MRSA Information and Surgical Site Infection Prevention

## 2012-07-16 NOTE — Progress Notes (Signed)
Patient informed Nurse that she had a stress test at [facility] on 09/17/2000. Patient denied having a cardiac cath or sleep study. PCP is Dr. Rodrigo Ran and patient has a upcoming appointment in June 2014. Patient denied having any cardiac issues and stated she had seasonal allergies.

## 2012-07-16 NOTE — Progress Notes (Signed)
Voicemail left instructing patient to get prescription filled and use ointment in nares twice a day for five days. Direct call back number also left on patients voicemail.

## 2012-07-20 NOTE — Progress Notes (Signed)
Patient reports right nasal soreness when using the mupirocin. 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 She took valtrex this am to try and prevent an outbreak and was also concerned if she needed to continue the mupirocin. Contacted IP to make aware and get their expertise on this spoke with laura Stines and was instructed to contact the surgeon. Contacted Dr. Donell Beers who was in surgery and per Synetta Fail Dr. Donell Beers instructed me to tell the patient to stop the mupirocin. Patient made aware to stop the mupirocin.

## 2012-07-21 MED ORDER — CEFAZOLIN SODIUM-DEXTROSE 2-3 GM-% IV SOLR
2.0000 g | INTRAVENOUS | Status: AC
  Administered 2012-07-22: 2 g via INTRAVENOUS
  Filled 2012-07-21: qty 50

## 2012-07-22 ENCOUNTER — Ambulatory Visit (HOSPITAL_COMMUNITY)
Admission: RE | Admit: 2012-07-22 | Discharge: 2012-07-22 | Disposition: A | Discharge status: Home / Self Care | Payer: Medicare Other | Source: Ambulatory Visit | Attending: General Surgery | Admitting: General Surgery

## 2012-07-22 ENCOUNTER — Ambulatory Visit (HOSPITAL_COMMUNITY): Payer: Medicare Other | Admitting: Certified Registered"

## 2012-07-22 ENCOUNTER — Encounter (HOSPITAL_COMMUNITY)
Admission: RE | Disposition: A | Discharge status: Home / Self Care | Payer: Self-pay | Source: Ambulatory Visit | Attending: General Surgery

## 2012-07-22 ENCOUNTER — Encounter (HOSPITAL_COMMUNITY): Payer: Self-pay | Admitting: Certified Registered"

## 2012-07-22 ENCOUNTER — Encounter (HOSPITAL_COMMUNITY): Payer: Self-pay | Admitting: *Deleted

## 2012-07-22 ENCOUNTER — Encounter (HOSPITAL_COMMUNITY)
Admission: RE | Admit: 2012-07-22 | Discharge: 2012-07-22 | Disposition: A | Discharge status: Home / Self Care | Payer: Medicare Other | Source: Ambulatory Visit | Attending: General Surgery | Admitting: General Surgery

## 2012-07-22 DIAGNOSIS — K219 Gastro-esophageal reflux disease without esophagitis: Secondary | ICD-10-CM | POA: Insufficient documentation

## 2012-07-22 DIAGNOSIS — C50919 Malignant neoplasm of unspecified site of unspecified female breast: Secondary | ICD-10-CM | POA: Insufficient documentation

## 2012-07-22 DIAGNOSIS — Z7982 Long term (current) use of aspirin: Secondary | ICD-10-CM | POA: Insufficient documentation

## 2012-07-22 DIAGNOSIS — D0590 Unspecified type of carcinoma in situ of unspecified breast: Secondary | ICD-10-CM

## 2012-07-22 DIAGNOSIS — E7800 Pure hypercholesterolemia, unspecified: Secondary | ICD-10-CM | POA: Insufficient documentation

## 2012-07-22 DIAGNOSIS — C50911 Malignant neoplasm of unspecified site of right female breast: Secondary | ICD-10-CM

## 2012-07-22 DIAGNOSIS — C50319 Malignant neoplasm of lower-inner quadrant of unspecified female breast: Secondary | ICD-10-CM | POA: Insufficient documentation

## 2012-07-22 HISTORY — PX: BREAST LUMPECTOMY WITH NEEDLE LOCALIZATION AND AXILLARY SENTINEL LYMPH NODE BX: SHX5760

## 2012-07-22 SURGERY — BREAST LUMPECTOMY WITH NEEDLE LOCALIZATION AND AXILLARY SENTINEL LYMPH NODE BX
Anesthesia: General | Site: Breast | Laterality: Right | Wound class: Clean

## 2012-07-22 MED ORDER — TECHNETIUM TC 99M SULFUR COLLOID FILTERED
1.0000 | Freq: Once | INTRAVENOUS | Status: AC | PRN
  Administered 2012-07-22: 1 via INTRADERMAL

## 2012-07-22 MED ORDER — FENTANYL CITRATE 0.05 MG/ML IJ SOLN: 50.0000 ug | INTRAMUSCULAR | Status: DC | PRN

## 2012-07-22 MED ORDER — ACETAMINOPHEN 650 MG RE SUPP
650.0000 mg | RECTAL | Status: DC | PRN
  Filled 2012-07-22: qty 1

## 2012-07-22 MED ORDER — LIDOCAINE HCL 1 % IJ SOLN
INTRAMUSCULAR | Status: DC | PRN
  Administered 2012-07-22: 17:00:00 via INTRAMUSCULAR

## 2012-07-22 MED ORDER — 0.9 % SODIUM CHLORIDE (POUR BTL) OPTIME
TOPICAL | Status: DC | PRN
  Administered 2012-07-22: 1000 mL

## 2012-07-22 MED ORDER — METHYLENE BLUE 1 % INJ SOLN
INTRAMUSCULAR | Status: AC
  Filled 2012-07-22: qty 10

## 2012-07-22 MED ORDER — HYDROMORPHONE HCL PF 1 MG/ML IJ SOLN
0.2500 mg | INTRAMUSCULAR | Status: DC | PRN
  Administered 2012-07-22: 0.5 mg via INTRAVENOUS
  Administered 2012-07-22: 0.25 mg via INTRAVENOUS

## 2012-07-22 MED ORDER — LACTATED RINGERS IV SOLN
INTRAVENOUS | Status: DC | PRN
  Administered 2012-07-22 (×2): via INTRAVENOUS

## 2012-07-22 MED ORDER — ACETAMINOPHEN 325 MG PO TABS
650.0000 mg | ORAL_TABLET | ORAL | Status: DC | PRN
  Filled 2012-07-22: qty 2

## 2012-07-22 MED ORDER — BUPIVACAINE-EPINEPHRINE PF 0.25-1:200000 % IJ SOLN
INTRAMUSCULAR | Status: AC
  Filled 2012-07-22: qty 30

## 2012-07-22 MED ORDER — OXYCODONE HCL 5 MG PO TABS: 5.0000 mg | ORAL_TABLET | ORAL | Status: DC | PRN

## 2012-07-22 MED ORDER — FENTANYL CITRATE 0.05 MG/ML IJ SOLN
INTRAMUSCULAR | Status: AC
  Administered 2012-07-22: 100 ug
  Filled 2012-07-22: qty 2

## 2012-07-22 MED ORDER — MIDAZOLAM HCL 2 MG/2ML IJ SOLN: 1.0000 mg | INTRAMUSCULAR | Status: DC | PRN

## 2012-07-22 MED ORDER — SODIUM CHLORIDE 0.9 % IJ SOLN: 3.0000 mL | Freq: Two times a day (BID) | INTRAMUSCULAR | Status: DC

## 2012-07-22 MED ORDER — LACTATED RINGERS IV SOLN
INTRAVENOUS | Status: DC
Start: 2012-07-22 — End: 2012-07-24
  Administered 2012-07-22: 15:00:00 via INTRAVENOUS

## 2012-07-22 MED ORDER — PROMETHAZINE HCL 25 MG/ML IJ SOLN
INTRAMUSCULAR | Status: AC
  Administered 2012-07-22: 6.25 mg
  Filled 2012-07-22: qty 1

## 2012-07-22 MED ORDER — SODIUM CHLORIDE 0.9 % IJ SOLN
INTRAMUSCULAR | Status: DC | PRN
  Administered 2012-07-22: 16:00:00 via INTRAMUSCULAR

## 2012-07-22 MED ORDER — SODIUM CHLORIDE 0.9 % IJ SOLN: 3.0000 mL | INTRAMUSCULAR | Status: DC | PRN

## 2012-07-22 MED ORDER — HYDROMORPHONE HCL PF 1 MG/ML IJ SOLN
INTRAMUSCULAR | Status: AC
  Administered 2012-07-22: 0.25 mg via INTRAVENOUS
  Filled 2012-07-22: qty 1

## 2012-07-22 MED ORDER — SODIUM CHLORIDE 0.9 % IJ SOLN
INTRAMUSCULAR | Status: AC
  Filled 2012-07-22: qty 3

## 2012-07-22 MED ORDER — ONDANSETRON HCL 4 MG/2ML IJ SOLN
4.0000 mg | Freq: Four times a day (QID) | INTRAMUSCULAR | Status: DC | PRN
  Administered 2012-07-22: 4 mg via INTRAVENOUS
  Filled 2012-07-22: qty 2

## 2012-07-22 MED ORDER — ONDANSETRON HCL 4 MG/2ML IJ SOLN
INTRAMUSCULAR | Status: DC | PRN
  Administered 2012-07-22: 4 mg via INTRAVENOUS

## 2012-07-22 MED ORDER — OXYCODONE HCL 5 MG PO TABS
ORAL_TABLET | ORAL | Status: AC
  Administered 2012-07-22: 10 mg via ORAL
  Filled 2012-07-22: qty 2

## 2012-07-22 MED ORDER — MIDAZOLAM HCL 2 MG/2ML IJ SOLN
INTRAMUSCULAR | Status: AC
  Administered 2012-07-22: 2 mg
  Filled 2012-07-22: qty 2

## 2012-07-22 MED ORDER — OXYCODONE-ACETAMINOPHEN 5-325 MG PO TABS: 1.0000 | ORAL_TABLET | ORAL | Status: DC | PRN

## 2012-07-22 MED ORDER — SODIUM CHLORIDE 0.9 % IV SOLN: 250.0000 mL | INTRAVENOUS | Status: DC | PRN

## 2012-07-22 MED ORDER — DEXTROSE 5 % IV SOLN
INTRAVENOUS | Status: DC | PRN
  Administered 2012-07-22: 16:00:00 via INTRAVENOUS

## 2012-07-22 SURGICAL SUPPLY — 54 items
BINDER BREAST LRG (GAUZE/BANDAGES/DRESSINGS) IMPLANT
BINDER BREAST XLRG (GAUZE/BANDAGES/DRESSINGS) ×1 IMPLANT
BLADE SURG 10 STRL SS (BLADE) ×2 IMPLANT
BLADE SURG 15 STRL LF DISP TIS (BLADE) ×1 IMPLANT
BLADE SURG 15 STRL SS (BLADE) ×2
BNDG COHESIVE 4X5 TAN STRL (GAUZE/BANDAGES/DRESSINGS) IMPLANT
CANISTER SUCTION 2500CC (MISCELLANEOUS) ×2 IMPLANT
CHLORAPREP W/TINT 26ML (MISCELLANEOUS) ×2 IMPLANT
CLIP TI LARGE 6 (CLIP) ×2 IMPLANT
CLIP TI MEDIUM 24 (CLIP) ×2 IMPLANT
CLIP TI WIDE RED SMALL 24 (CLIP) ×2 IMPLANT
CLOTH BEACON ORANGE TIMEOUT ST (SAFETY) ×2 IMPLANT
CONT SPEC STER OR (MISCELLANEOUS) ×2 IMPLANT
COVER PROBE W GEL 5X96 (DRAPES) ×2 IMPLANT
COVER SURGICAL LIGHT HANDLE (MISCELLANEOUS) ×2 IMPLANT
DECANTER SPIKE VIAL GLASS SM (MISCELLANEOUS) ×4 IMPLANT
DEVICE DUBIN SPECIMEN MAMMOGRA (MISCELLANEOUS) ×2 IMPLANT
DRAPE UTILITY 15X26 W/TAPE STR (DRAPE) ×4 IMPLANT
DRSG PAD ABDOMINAL 8X10 ST (GAUZE/BANDAGES/DRESSINGS) ×2 IMPLANT
ELECT CAUTERY BLADE 6.4 (BLADE) ×2 IMPLANT
ELECT REM PT RETURN 9FT ADLT (ELECTROSURGICAL) ×2
ELECTRODE REM PT RTRN 9FT ADLT (ELECTROSURGICAL) ×1 IMPLANT
GLOVE BIO SURGEON STRL SZ 6 (GLOVE) ×2 IMPLANT
GLOVE BIOGEL PI IND STRL 6.5 (GLOVE) ×1 IMPLANT
GLOVE BIOGEL PI INDICATOR 6.5 (GLOVE) ×1
GOWN PREVENTION PLUS XXLARGE (GOWN DISPOSABLE) ×2 IMPLANT
GOWN STRL NON-REIN LRG LVL3 (GOWN DISPOSABLE) ×2 IMPLANT
KIT BASIN OR (CUSTOM PROCEDURE TRAY) ×2 IMPLANT
KIT MARKER MARGIN INK (KITS) ×2 IMPLANT
KIT ROOM TURNOVER OR (KITS) ×2 IMPLANT
NDL 18GX1X1/2 (RX/OR ONLY) (NEEDLE) ×1 IMPLANT
NDL HYPO 25GX1X1/2 BEV (NEEDLE) ×2 IMPLANT
NEEDLE 18GX1X1/2 (RX/OR ONLY) (NEEDLE) ×2 IMPLANT
NEEDLE HYPO 25GX1X1/2 BEV (NEEDLE) ×2 IMPLANT
NS IRRIG 1000ML POUR BTL (IV SOLUTION) ×2 IMPLANT
PACK SURGICAL SETUP 50X90 (CUSTOM PROCEDURE TRAY) ×2 IMPLANT
PACK UNIVERSAL I (CUSTOM PROCEDURE TRAY) ×2 IMPLANT
PAD ARMBOARD 7.5X6 YLW CONV (MISCELLANEOUS) ×2 IMPLANT
PENCIL BUTTON HOLSTER BLD 10FT (ELECTRODE) ×2 IMPLANT
SPONGE GAUZE 4X4 12PLY (GAUZE/BANDAGES/DRESSINGS) ×1 IMPLANT
SPONGE LAP 18X18 X RAY DECT (DISPOSABLE) ×2 IMPLANT
STOCKINETTE IMPERVIOUS 9X36 MD (GAUZE/BANDAGES/DRESSINGS) ×2 IMPLANT
STRIP CLOSURE SKIN 1/2X4 (GAUZE/BANDAGES/DRESSINGS) ×1 IMPLANT
SUT MNCRL AB 4-0 PS2 18 (SUTURE) ×3 IMPLANT
SUT SILK 2 0 SH (SUTURE) ×1 IMPLANT
SUT VIC AB 2-0 SH 27 (SUTURE) ×2
SUT VIC AB 2-0 SH 27XBRD (SUTURE) ×1 IMPLANT
SUT VIC AB 3-0 SH 27 (SUTURE) ×4
SUT VIC AB 3-0 SH 27X BRD (SUTURE) ×1 IMPLANT
SYR CONTROL 10ML LL (SYRINGE) ×4 IMPLANT
TOWEL OR 17X24 6PK STRL BLUE (TOWEL DISPOSABLE) ×2 IMPLANT
TOWEL OR 17X26 10 PK STRL BLUE (TOWEL DISPOSABLE) ×2 IMPLANT
TUBE CONNECTING 12X1/4 (SUCTIONS) ×2 IMPLANT
YANKAUER SUCT BULB TIP NO VENT (SUCTIONS) ×2 IMPLANT

## 2012-07-22 NOTE — H&P (View-Only) (Signed)
Chief complaint:  Right breast cancer  HISTORY: Pt is a 72 yo F who presents with screening detected architectural distortion.  Her diagnostic mammo and ultrasound demonstrated a 1.2 cm mass at 4 o'clock.  MR demonstrated 1.6 cm mass.  She is unable to palpate mass.  She had a maternal aunt with breast cancer.  She had menarche at age 12.  She is no longer having periods. She had hysterectomy.   She had 2 children, and used hormone contraception for 14 years.  She is up to date on screening.  She had not had breast biopsy in past.  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    Past Medical History  Diagnosis Date  . Bronchitis   . Thyroid disease   . Acid reflux disease   . High cholesterol   . Breast cancer     Past Surgical History  Procedure Laterality Date  . Abdominal hysterectomy    . Cholecystectomy    . Upper gi endoscopy    . Colonoscopy      Current Outpatient Prescriptions  Medication Sig Dispense Refill  . aspirin 81 MG tablet Take 81 mg by mouth daily.      . b complex vitamins tablet Take 1 tablet by mouth daily.      . beclomethasone (QVAR) 40 MCG/ACT inhaler Inhale 2 puffs into the lungs 2 (two) times daily.      . doxycycline (VIBRAMYCIN) 100 MG capsule Take 100 mg by mouth 2 (two) times daily.      . estradiol (CLIMARA - DOSED IN MG/24 HR) 0.0375 mg/24hr Place 1 patch onto the skin once a week.      . fluticasone (FLONASE) 50 MCG/ACT nasal spray Place 2 sprays into the nose daily.      . gabapentin (NEURONTIN) 100 MG capsule Take 100 mg by mouth as needed. Nerve pain      . levothyroxine (SYNTHROID, LEVOTHROID) 75 MCG tablet Take 75 mcg by mouth daily.      . loratadine (CLARITIN) 10 MG tablet Take 10 mg by mouth daily.      . pantoprazole (PROTONIX) 40 MG tablet Take 40 mg by mouth daily.      . predniSONE (STERAPRED UNI-PAK) 10 MG tablet Take by mouth daily.      . rosuvastatin (CRESTOR) 10 MG tablet Take 10 mg by mouth daily.       . valACYclovir (VALTREX) 500 MG tablet Take 1 g by mouth as needed.       . Vitamin D, Ergocalciferol, (DRISDOL) 50000 UNITS CAPS Take 50,000 Units by mouth every 7 (seven) days. Pt takes on Wed       No current facility-administered medications for this visit.     No Known Allergies   Family History  Problem Relation Age of Onset  . Prostate cancer Father   . Breast cancer Maternal Aunt   . Lung cancer Maternal Uncle   . Lung cancer Maternal Grandmother   . Kidney cancer Maternal Grandmother      History   Social History  . Marital Status: Widowed    Spouse Name: N/A    Number of Children: N/A  . Years of Education: N/A   Social History Main Topics  . Smoking status: Never Smoker   . Smokeless tobacco: Not on file  . Alcohol Use: No  . Drug Use: No  . Sexually Active: Not on file   Other Topics Concern  . Not on file     Social History Narrative  . No narrative on file     REVIEW OF SYSTEMS - PERTINENT POSITIVES ONLY: 12 point review of systems negative other than HPI and PMH except for back pain, sinus problems, cough, incontinence, easy bruising, hot flashes.    EXAM: Wt Readings from Last 3 Encounters:  07/07/12 161 lb (73.029 kg)   Temp Readings from Last 3 Encounters:  07/07/12 97.9 F (36.6 C) Oral  03/18/12 97.3 F (36.3 C) Oral   BP Readings from Last 3 Encounters:  07/07/12 169/78  03/18/12 171/94   Pulse Readings from Last 3 Encounters:  07/07/12 83  03/18/12 92      Gen:  No acute distress.  Well nourished and well groomed.   Neurological: Alert and oriented to person, place, and time. Coordination normal.  Head: Normocephalic and atraumatic.  Eyes: Conjunctivae are normal. Pupils are equal, round, and reactive to light. No scleral icterus.  Neck: Normal range of motion. Neck supple. No tracheal deviation or thyromegaly present.  Cardiovascular: Normal rate, regular rhythm, normal heart sounds and intact distal pulses.  Exam reveals  no gallop and no friction rub.  No murmur heard. Breast:  Breasts are symmetric bilaterally.  Slightly ptotic. No palpable masses or lymphadenopathy.  No skin dimpling or nipple retraction.   Respiratory: Effort normal.  No respiratory distress. No chest wall tenderness. Breath sounds normal.  No wheezes, rales or rhonchi.  GI: Soft. Bowel sounds are normal. The abdomen is soft and nontender.  There is no rebound and no guarding.  Musculoskeletal: Normal range of motion. Extremities are nontender.  Lymphadenopathy: No cervical, preauricular, postauricular or axillary adenopathy is present Skin: Skin is warm and dry. No rash noted. No diaphoresis. No erythema. No pallor. No clubbing, cyanosis, or edema.   Psychiatric: Normal mood and affect. Behavior is normal. Judgment and thought content normal.    LABORATORY RESULTS: Available labs are reviewed  CBC normal.   RADIOLOGY RESULTS: See E-Chart or I-Site for most recent results.  Images and reports are reviewed.  MR IMPRESSION:  1. 1.6 x 1.2 x 0.8 cm biopsy-proven invasive lobular carcinoma in  the lower inner quadrant of the right breast.  2. Otherwise, unremarkable examination.     ASSESSMENT AND PLAN: Cancer of lower-inner quadrant of female breast Pt is a 72 yo F with cT1N0Mx right breast cancer.    She is good candidate for breast conservation.   Will schedule for right needle loc lumpectomy and sentinel lymph node biopsy.    The surgical procedure was described to the patient.  I discussed the incision type and location and that we would need radiology involved on the day of surgery with a wire marker and sentinel node.      The risks and benefits of the procedure were described to the patient and he/she wishes to proceed.    We discussed the risks bleeding, infection, damage to other structures, need for further procedures/surgeries.  We discussed the risk of seroma.  The patient was advised if the area in the breast in  cancer, we may need to go back to surgery for additional tissue to obtain negative margins or for a lymph node biopsy. The patient was advised that these are the most common complications, but that others can occur as well.  They were advised against taking aspirin or other anti-inflammatory agents/blood thinners the week before surgery.    45 min spent with visit, more than 50% in counseling.           Shelia Obrien L Shelia Berland MD Surgical Oncology, General and Endocrine Surgery [facility]      Visit Diagnoses: 1. Breast cancer, right   2. Cancer of lower-inner quadrant of female breast, right     Primary Care Physician: PERINI,MARK A, MD    

## 2012-07-22 NOTE — Interval H&P Note (Signed)
History and Physical Interval Note:  07/22/2012 2:33 PM  Shelia Obrien  has presented today for surgery, with the diagnosis of right breast cancer  The various methods of treatment have been discussed with the patient and family. After consideration of risks, benefits and other options for treatment, the patient has consented to  Procedure(s): BREAST LUMPECTOMY WITH NEEDLE LOCALIZATION AND AXILLARY SENTINEL LYMPH NODE BX (Right) as a surgical intervention .  The patient's history has been reviewed, patient examined, no change in status, stable for surgery.  I have reviewed the patient's chart and labs.  Questions were answered to the patient's satisfaction.     Keeven Matty

## 2012-07-22 NOTE — Anesthesia Postprocedure Evaluation (Signed)
  Anesthesia Post-op Note  Patient: Shelia Obrien  Procedure(s) Performed: Procedure(s): BREAST LUMPECTOMY WITH NEEDLE LOCALIZATION AND AXILLARY SENTINEL LYMPH NODE BX (Right)  Patient Location: PACU  Anesthesia Type:General  Level of Consciousness: awake  Airway and Oxygen Therapy: Patient Spontanous Breathing  Post-op Pain: mild  Post-op Assessment: Post-op Vital signs reviewed  Post-op Vital Signs: Reviewed  Complications: No apparent anesthesia complications

## 2012-07-22 NOTE — Anesthesia Preprocedure Evaluation (Addendum)
Anesthesia Evaluation  Patient identified by MRN, date of birth, ID band Patient awake    Reviewed: Allergy & Precautions, H&P , NPO status , Patient's Chart, lab work & pertinent test results, reviewed documented beta blocker date and time   History of Anesthesia Complications (+) PONV  Airway Mallampati: II TM Distance: >3 FB Neck ROM: Full    Dental  (+) Caps, Teeth Intact and Dental Advisory Given   Pulmonary neg pulmonary ROS,  breath sounds clear to auscultation        Cardiovascular negative cardio ROS  Rhythm:Regular Rate:Normal     Neuro/Psych    GI/Hepatic Neg liver ROS, GERD-  Medicated and Controlled,  Endo/Other  Hypothyroidism   Renal/GU negative Renal ROS     Musculoskeletal   Abdominal   Peds  Hematology negative hematology ROS (+)   Anesthesia Other Findings   Reproductive/Obstetrics                         Anesthesia Physical Anesthesia Plan  ASA: III  Anesthesia Plan: General   Post-op Pain Management:    Induction: Intravenous  Airway Management Planned: Oral ETT  Additional Equipment:   Intra-op Plan:   Post-operative Plan: Extubation in OR  Informed Consent: I have reviewed the patients History and Physical, chart, labs and discussed the procedure including the risks, benefits and alternatives for the proposed anesthesia with the patient or authorized representative who has indicated his/her understanding and acceptance.   Dental advisory given  Plan Discussed with: CRNA, Anesthesiologist and Surgeon  Anesthesia Plan Comments:         Anesthesia Quick Evaluation

## 2012-07-22 NOTE — Transfer of Care (Signed)
Immediate Anesthesia Transfer of Care Note  Patient: Shelia Obrien  Procedure(s) Performed: Procedure(s): BREAST LUMPECTOMY WITH NEEDLE LOCALIZATION AND AXILLARY SENTINEL LYMPH NODE BX (Right)  Patient Location: PACU  Anesthesia Type:General  Level of Consciousness: awake  Airway & Oxygen Therapy: Patient Spontanous Breathing and Patient connected to nasal cannula oxygen  Post-op Assessment: Report given to PACU RN, Post -op Vital signs reviewed and stable and Patient moving all extremities  Post vital signs: Reviewed  Complications: No apparent anesthesia complications

## 2012-07-22 NOTE — Anesthesia Procedure Notes (Signed)
Procedure Name: LMA Insertion Date/Time: 07/22/2012 3:42 PM Performed by: Darcey Nora B Patient Re-evaluated:Patient Re-evaluated prior to inductionOxygen Delivery Method: Circle system utilized Preoxygenation: Pre-oxygenation with 100% oxygen Intubation Type: IV induction Ventilation: Mask ventilation without difficulty LMA: LMA inserted LMA Size: 4.0 Number of attempts: 1 Placement Confirmation: positive ETCO2 and breath sounds checked- equal and bilateral ETT to lip (cm): taped to cheeks; gauze roll b/t teeth. Tube secured with: Tape Dental Injury: Teeth and Oropharynx as per pre-operative assessment

## 2012-07-22 NOTE — Op Note (Signed)
Breast Lumpectomy with Sentinel Node Biopsy Procedure Note,   Indications: This patient presents with history of right breast cancer with clinically negative axillary lymph node exam.  Pre-operative Diagnosis: right breast cancer, cT1N0M0  Post-operative Diagnosis: right breast cancer  Surgeon: Almond Lint   Anesthesia: General endotracheal anesthesia  ASA Class: 2  Procedure Details  The patient was seen in the Holding Room. The risks, benefits, complications, treatment options, and expected outcomes were discussed with the patient. The possibilities of bleeding, infection, the need for additional procedures, failure to diagnose a condition, and creating a complication requiring transfusion or operation were discussed with the patient. The patient concurred with the proposed plan, giving informed consent.  The site of surgery properly noted/marked. The patient was taken to Operating Room # 2, identified, and the procedure verified as Breast Lumpectomy and Sentinal Node Biopsy. A Time Out was held and the above information confirmed.  The right breast was injected with 2 mL methylene blue diluted up to 5 mL with injectable saline.  This was injected in the subareolar position after alcohol prep.  The right arm, breast, and bilateral chest were prepped and draped in standard fashion.   The lumpectomy was performed by creating an transverse incision over the lower inner quadrant of the breast around the previously placed localization guidewire.  Dissection was carried down to the pectoral fascia. Curved mayo scissors were used to dissect around the wire in a spherical shape.  The pectoralis fascia was taken posteriorly.   The edges of the cavity were marked with large clips, with one each medial, lateral, inferior and superior, and two clips posteriorly.   The specimen was inked with the margin marker paint kit.    Specimen radiography confirmed inclusion of the mammographic lesion.  Hemostasis was  achieved with cautery.  The wound was irrigated and closed with 3-0 vicryl in layers and 4-0 monocryl subcuticular suture.    Using a hand-held gamma probe, R axillary sentinel nodes were identified transcutaneously.  An oblique incision was created below the axillary hairline.  Dissection was carried through the clavipectoral fascia.  4 level 2 axillary sentinel nodes were removed.  Counts per second were 2050, 550, 1200, 400. None were blue.  The background count was 15 cps.    The axillary incision was closed with a 3-0 vicryl deep dermal interrupted sutures and a 4-0 monocryl subcuticular closure.     Sterile dressings were applied. At the end of the operation, all sponge, instrument, and needle counts were correct.  Findings: grossly clear surgical margins and no adenopathy  Estimated Blood Loss:  minimal         Specimens: R breast lumpectomy and 4 axillary sentinel nodes, counts described above.            Complications:  None; patient tolerated the procedure well.         Disposition: PACU - hemodynamically stable.         Condition: stable

## 2012-07-22 NOTE — Preoperative (Signed)
Beta Blockers   Reason not to administer Beta Blockers:Not Applicable 

## 2012-07-22 NOTE — Progress Notes (Signed)
Attempted to transfer patient to SS, immediately became nauseated with scant amount of green liquid emesis, pt seen by Dr.Fitzgerald, new orders rec'd for IV Phenergan, pt rec'd 6.25mg  IV, pt tol/well, pt rested for short time, now awake and pt states she is ready to go home, pt satting 98% on RA, no other needs at this time, report given to [facility], transferred to Short stay.

## 2012-07-22 NOTE — Progress Notes (Signed)
Rec'd report from Vania Rea RN, assuming care of patient

## 2012-07-22 NOTE — Interval H&P Note (Signed)
History and Physical Interval Note:  07/22/2012 2:32 PM  Shelia Obrien  has presented today for surgery, with the diagnosis of right breast cancer  The various methods of treatment have been discussed with the patient and family. After consideration of risks, benefits and other options for treatment, the patient has consented to  Procedure(s): BREAST LUMPECTOMY WITH NEEDLE LOCALIZATION AND AXILLARY SENTINEL LYMPH NODE BX (Right) as a surgical intervention .  The patient's history has been reviewed, patient examined, no change in status, stable for surgery.  I have reviewed the patient's chart and labs.  Questions were answered to the patient's satisfaction.     Omkar Stratmann

## 2012-07-23 ENCOUNTER — Telehealth (INDEPENDENT_AMBULATORY_CARE_PROVIDER_SITE_OTHER): Payer: Self-pay | Admitting: General Surgery

## 2012-07-23 ENCOUNTER — Encounter (HOSPITAL_COMMUNITY): Payer: Self-pay | Admitting: General Surgery

## 2012-07-23 NOTE — Telephone Encounter (Signed)
Spoke with patient she is aware of appt 08/06/12

## 2012-07-27 ENCOUNTER — Telehealth (INDEPENDENT_AMBULATORY_CARE_PROVIDER_SITE_OTHER): Payer: Self-pay | Admitting: General Surgery

## 2012-07-27 NOTE — Telephone Encounter (Signed)
Discussed pathology with patient

## 2012-07-29 ENCOUNTER — Encounter: Payer: Self-pay | Admitting: *Deleted

## 2012-07-29 NOTE — Progress Notes (Signed)
Per Alvis Lemmings - ok to order Oncotype Dx test.  Ordered Oncotype Dx test w/ [facility]. Faxed request to [facility] and called Mandy to verify that she received it.  Faxed PAC to [employer].

## 2012-08-03 ENCOUNTER — Ambulatory Visit: Payer: Medicare Other | Admitting: Oncology

## 2012-08-05 ENCOUNTER — Encounter: Payer: Self-pay | Admitting: *Deleted

## 2012-08-05 NOTE — Progress Notes (Signed)
Re-faxed PAC to Angela Casper at [facility]. 

## 2012-08-06 ENCOUNTER — Encounter: Payer: Self-pay | Admitting: *Deleted

## 2012-08-06 ENCOUNTER — Encounter (INDEPENDENT_AMBULATORY_CARE_PROVIDER_SITE_OTHER): Payer: Medicare Other | Admitting: General Surgery

## 2012-08-06 NOTE — Progress Notes (Signed)
Received Oncotype Dx results of 29.  Gave copy to MD.  Rochele Pages copy to [facility] for scan.

## 2012-08-09 ENCOUNTER — Ambulatory Visit (INDEPENDENT_AMBULATORY_CARE_PROVIDER_SITE_OTHER): Payer: Medicare Other | Admitting: General Surgery

## 2012-08-09 VITALS — BP 142/70 | HR 72 | Resp 18 | Ht 63.0 in | Wt 155.8 lb

## 2012-08-09 DIAGNOSIS — C50311 Malignant neoplasm of lower-inner quadrant of right female breast: Secondary | ICD-10-CM

## 2012-08-09 DIAGNOSIS — C50319 Malignant neoplasm of lower-inner quadrant of unspecified female breast: Secondary | ICD-10-CM

## 2012-08-09 NOTE — Progress Notes (Signed)
HISTORY: Pt is 2 weeks post op from BCT for right breast cancer.  She is off narcotics.  She is having minimal soreness.  She denies difficulty with mobility.  She has an area of soreness on right upper arm.      EXAM: General:  Alert and oriented Incision:  Healing well.  No evidence of cellulitis or clinically significant seroma/hematoma.   PATHOLOGY: INVASIVE LOBULAR CARCINOMA, GRADE II/III, SPANNING 1.7 CM. - LOBULAR CARCINOMA IN SITU. - PERINEURAL INVASION IS IDENTIFIED. - THE SURGICAL RESECTION MARGINS ARE NEGATIVE FOR CARCINOMA. - SEE ONCOLOGY TABLE BELOW. 2. Lymph node, sentinel, biopsy, #1 right axilla - THERE IS NO EVIDENCE OF CARCINOMA IN 1 OF 1 LYMPH NODE (0/1). 3. Lymph node, sentinel, biopsy, #2 right axilla - THERE IS NO EVIDENCE OF CARCINOMA IN 1 OF 1 LYMPH NODE (0/1). 4. Lymph node, sentinel, biopsy, #3, right axilla - THERE IS NO EVIDENCE OF CARCINOMA IN 1 OF 1 LYMPH NODE (0/1). 5. Lymph node, sentinel, biopsy, #4, right axilla - THERE IS NO EVIDENCE OF CARCINOMA IN 1 OF 1 LYMPH NODE (0/1).   ASSESSMENT AND PLAN:   Cancer of lower-inner quadrant of female breast Pt doing well post op without evidence of surgical complications.  Follow up in 3 months.  Pt set up to see Dr. Khan.  Has 1.7 cm tumor, but recurrence score of 29.  Will see if she is to receive chemo or not.        Raif Chachere L Dylin Breeden, MD Surgical Oncology, General & Endocrine Surgery [facility]  PERINI,MARK A, MD No ref. provider found    

## 2012-08-09 NOTE — Patient Instructions (Signed)
Cleared from restrictions.  Follow up in 3 months.

## 2012-08-09 NOTE — Assessment & Plan Note (Signed)
Pt doing well post op without evidence of surgical complications.  Follow up in 3 months.  Pt set up to see Dr. Welton Flakes.  Has 1.7 cm tumor, but recurrence score of 29.  Will see if she is to receive chemo or not.

## 2012-08-10 ENCOUNTER — Encounter: Payer: Self-pay | Admitting: *Deleted

## 2012-08-10 NOTE — Progress Notes (Signed)
Mailed after appt letter to pt. 

## 2012-08-13 ENCOUNTER — Telehealth: Payer: Self-pay | Admitting: *Deleted

## 2012-08-13 ENCOUNTER — Encounter: Payer: Self-pay | Admitting: Oncology

## 2012-08-13 ENCOUNTER — Ambulatory Visit (HOSPITAL_BASED_OUTPATIENT_CLINIC_OR_DEPARTMENT_OTHER): Payer: Medicare Other | Admitting: Oncology

## 2012-08-13 VITALS — BP 168/106 | HR 88 | Temp 97.4°F | Resp 20 | Ht 63.0 in | Wt 157.8 lb

## 2012-08-13 DIAGNOSIS — Z170 Estrogen receptor positive status [ER+]: Secondary | ICD-10-CM

## 2012-08-13 DIAGNOSIS — C50319 Malignant neoplasm of lower-inner quadrant of unspecified female breast: Secondary | ICD-10-CM

## 2012-08-13 DIAGNOSIS — C50311 Malignant neoplasm of lower-inner quadrant of right female breast: Secondary | ICD-10-CM

## 2012-08-13 MED ORDER — ONDANSETRON HCL 8 MG PO TABS: 8.0000 mg | ORAL_TABLET | Freq: Two times a day (BID) | ORAL | Status: DC

## 2012-08-13 MED ORDER — LORAZEPAM 0.5 MG PO TABS: 0.5000 mg | ORAL_TABLET | Freq: Four times a day (QID) | ORAL | Status: DC | PRN

## 2012-08-13 MED ORDER — LIDOCAINE-PRILOCAINE 2.5-2.5 % EX CREA: TOPICAL_CREAM | CUTANEOUS | Status: DC | PRN

## 2012-08-13 MED ORDER — PROCHLORPERAZINE MALEATE 10 MG PO TABS: 10.0000 mg | ORAL_TABLET | Freq: Four times a day (QID) | ORAL | Status: DC | PRN

## 2012-08-13 MED ORDER — DEXAMETHASONE 4 MG PO TABS: 8.0000 mg | ORAL_TABLET | Freq: Two times a day (BID) | ORAL | Status: DC

## 2012-08-13 MED ORDER — PROCHLORPERAZINE 25 MG RE SUPP: 25.0000 mg | Freq: Two times a day (BID) | RECTAL | Status: DC | PRN

## 2012-08-13 NOTE — Patient Instructions (Addendum)
We discussed your final pathology an Oncotype DX results.  We discussed adjuvant chemotherapy consisting of Taxotere and Cytoxan to be given every 3 weeks for a total of 4 cycles.  We discussed side effects of both drugs.  We discussed Neulasta injection day after chemotherapy.  We discussed Port-A-Cath placement and chemotherapy teaching class.  Plan is to begin chemotherapy on 08/27/2012. A

## 2012-08-13 NOTE — Telephone Encounter (Signed)
appts made and printed...td 

## 2012-08-16 ENCOUNTER — Other Ambulatory Visit (INDEPENDENT_AMBULATORY_CARE_PROVIDER_SITE_OTHER): Payer: Self-pay | Admitting: General Surgery

## 2012-08-17 ENCOUNTER — Other Ambulatory Visit: Payer: Medicare Other

## 2012-08-17 ENCOUNTER — Encounter: Payer: Self-pay | Admitting: *Deleted

## 2012-08-17 NOTE — Progress Notes (Signed)
RECEIVED A FAX FROM [facility] CONCERNING A PRIOR AUTHORIZATION FOR ZOFRAN. THIS REQUEST WAS PLACED IN THE MANAGED CARE BIN. 

## 2012-08-18 ENCOUNTER — Encounter: Payer: Self-pay | Admitting: Oncology

## 2012-08-18 NOTE — Progress Notes (Signed)
[employer], [id number], approved tier exception and pa for ondansetron 8mg  30 tabs from 08/18/12-08/18/13.

## 2012-08-19 ENCOUNTER — Encounter (HOSPITAL_BASED_OUTPATIENT_CLINIC_OR_DEPARTMENT_OTHER): Payer: Self-pay | Admitting: *Deleted

## 2012-08-19 ENCOUNTER — Encounter (INDEPENDENT_AMBULATORY_CARE_PROVIDER_SITE_OTHER): Payer: Self-pay

## 2012-08-19 NOTE — Progress Notes (Signed)
No more labs needed- 

## 2012-08-22 NOTE — Progress Notes (Signed)
OFFICE PROGRESS NOTE  CC  Shelia Kayser, MD [address] [place] [address] Dr. Almond Lint  Dr. 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   STAGE:  Cancer of lower-inner quadrant of female breast  Primary site: Breast (Right)  Staging method: AJCC 7th Edition  Clinical: Stage IA (T1c, N0, cM0)  Summary: Stage IA (T1c, N0, cM0)   PRIOR THERAPY: #1 patient originally presented at the [facility] with a mammogram that showed architectural distortion. Ultrasound showed a 1.2 cm mass in the 4:00 position of the right breast. MRI confirmed a solitary lesion measuring 1.6 cm up. 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  #2 patient is status post lumpectomy with sentinel lymph node biopsy the final pathology revealed a 1.7 cm invasive lobular carcinoma. Tumor was ER positive PR positive HER-2/neu negative with a Ki-67 of 12% 4 sentinel nodes were negative for metastatic disease.  #3 patient had Oncotype DX testing performed her recurrence score was 29 giving her a 19% risk of distant recurrence with adjuvant hormonal therapy only. Patient and I and her family discussed her Oncotype DX testing this puts her in the intermediate risk category. We discussed side effects of chemotherapy types of chemotherapy. I would recommend Taxotere and Cytoxan every 3 weeks for 4 cycles. We can certainly begin this as soon as the Port-A-Cath is placed.  CURRENT THERAPY:adjuvant chemotherapy consisting of Taxotere Cytoxan q. 21 days to begin 08/30/2012  INTERVAL HISTORY: Shelia Obrien 72 y.o. female returns for followup visit today to discuss her final pathology.  Postoperatively she is doing well without any complaints except for tenderness. She denies any nausea vomiting fevers chills night sweats headaches shortness of breath chest  pains or palpitations she has no myalgias. No peripheral paresthesias. Remainder of the 10 point review of systems is negative  MEDICAL HISTORY: Past Medical History  Diagnosis Date  . Bronchitis   . Thyroid disease   . Acid reflux disease   . High cholesterol   . Breast cancer   . Seasonal allergies   . Hypothyroidism   . Leaking of urine     Dribbles if coughs. Pt wears pad  . Epistaxis     went to [facility] ER  . Shortness of breath   . PONV (postoperative nausea and vomiting)   . Wears glasses   . Arthritis     ALLERGIES:  has No Known Allergies.  MEDICATIONS:  Current Outpatient Prescriptions  Medication Sig Dispense Refill  . b complex vitamins tablet Take 1 tablet by mouth daily.      Marland Kitchen gabapentin (NEURONTIN) 100 MG capsule Take 100-300 mg by mouth daily as needed (for pain). Nerve pain      . levothyroxine (SYNTHROID, LEVOTHROID) 75 MCG tablet Take 75 mcg by mouth daily.      Marland Kitchen OVER THE COUNTER MEDICATION Take 5,000 Units by mouth once a week.      . pantoprazole (PROTONIX) 40 MG tablet Take 40 mg by mouth daily.      . rosuvastatin (CRESTOR) 10 MG tablet Take 10 mg by mouth daily.      . valACYclovir (VALTREX) 500 MG tablet Take 500 mg by mouth 3 (three) times daily as needed (for blisters).       . beclomethasone (QVAR) 40 MCG/ACT inhaler Inhale 2 puffs into the lungs  2 (two) times daily as needed (for shortness of breath).       . cetirizine (ZYRTEC) 10 MG tablet Take 10 mg by mouth daily.      Marland Kitchen dexamethasone (DECADRON) 4 MG tablet Take 2 tablets (8 mg total) by mouth 2 (two) times daily with a meal. Take two times a day the day before Taxotere. Then take two times a day starting the day after chemo for 3 days.  30 tablet  1  . fluticasone (FLONASE) 50 MCG/ACT nasal spray Place 2 sprays into the nose daily.      Marland Kitchen lidocaine-prilocaine (EMLA) cream Apply topically as needed.  30 g  7  . LORazepam (ATIVAN) 0.5 MG tablet Take 1 tablet (0.5 mg total) by mouth every 6  (six) hours as needed (Nausea or vomiting).  30 tablet  0  . ondansetron (ZOFRAN) 8 MG tablet Take 1 tablet (8 mg total) by mouth 2 (two) times daily. Take two times a day starting the day after chemo for 3 days. Then take two times a day as needed for nausea or vomiting.  30 tablet  1  . oxyCODONE-acetaminophen (ROXICET) 5-325 MG per tablet Take 1-2 tablets by mouth every 4 (four) hours as needed for pain.  30 tablet  0  . prochlorperazine (COMPAZINE) 10 MG tablet Take 1 tablet (10 mg total) by mouth every 6 (six) hours as needed (Nausea or vomiting).  30 tablet  1  . prochlorperazine (COMPAZINE) 25 MG suppository Place 1 suppository (25 mg total) rectally every 12 (twelve) hours as needed for nausea.  12 suppository  3  . Vitamin Obrien, Ergocalciferol, (DRISDOL) 50000 UNITS CAPS Take 50,000 Units by mouth every 7 (seven) days. Pt takes on Wed       No current facility-administered medications for this visit.    SURGICAL HISTORY:  Past Surgical History  Procedure Laterality Date  . Abdominal hysterectomy    . Cholecystectomy    . Upper gi endoscopy    . Colonoscopy    . Knee arthroscopy Left   . Nasal septum surgery    . Breast lumpectomy with needle localization and axillary sentinel lymph node bx Right 07/22/2012    Procedure: BREAST LUMPECTOMY WITH NEEDLE LOCALIZATION AND AXILLARY SENTINEL LYMPH NODE BX;  Surgeon: Almond Lint, MD;  Location: [facility] OR;  Service: General;  Laterality: Right;    REVIEW OF SYSTEMS:  Pertinent items are noted in HPI.   HEALTH MAINTENANCE:   PHYSICAL EXAMINATION: Blood pressure 168/106, pulse 88, temperature 97.4 F (36.3 C), temperature source Oral, resp. rate 20, height 5\' 3"  (1.6 m), weight 157 lb 12.8 oz (71.578 kg). Body mass index is 27.96 kg/(m^2). ECOG PERFORMANCE STATUS: 0 - Asymptomatic   well-developed older his female in no acute distress HEENT exam EOMI PERRLA sclerae anicteric no conjunctival pallor oral mucosa is moist neck is supple lungs are  clear to auscultation cardiovascular is regular rate rhythm abdomen is soft nontender nondistended bowel sounds are present no HSM extremities no edema neuro patient's alert oriented otherwise nonfocal right lumpectomy site looks well-healed no skin changes no erythema no nodularity.   LABORATORY DATA: Lab Results  Component Value Date   WBC 4.5 07/16/2012   HGB 14.0 07/16/2012   HCT 40.1 07/16/2012   MCV 89.3 07/16/2012   PLT 194 07/16/2012      Chemistry      Component Value Date/Time   NA 140 07/16/2012 1040   NA 141 07/07/2012 1204   K 4.8  07/16/2012 1040   K 4.3 07/07/2012 1204   CL 108 07/16/2012 1040   CL 105 07/07/2012 1204   CO2 24 07/16/2012 1040   CO2 26 07/07/2012 1204   BUN 13 07/16/2012 1040   BUN 20.7 07/07/2012 1204   CREATININE 1.05 07/16/2012 1040   CREATININE 1.0 07/07/2012 1204      Component Value Date/Time   CALCIUM 9.2 07/16/2012 1040   CALCIUM 10.0 07/07/2012 1204   ALKPHOS 54 07/07/2012 1204   AST 33 07/07/2012 1204   ALT 28 07/07/2012 1204   BILITOT 0.74 07/07/2012 1204     ADDITIONAL INFORMATION: 1. A sample (block 1A) was sent to [facility] for Oncotype testing. The patient's recurrence score is 29. Those patients who had a recurrence score of 29 had an average rate of distant recurrence of 19%. (JBK:caf 08/06/12) Pecola Leisure MD Pathologist, Electronic Signature ( Signed 08/06/2012) 1. CHROMOGENIC IN-SITU HYBRIDIZATION Results: HER-2/NEU BY CISH - NO AMPLIFICATION OF HER-2 DETECTED. RESULT RATIO OF HER2: CEP 17 SIGNALS 0.94 AVERAGE HER2 COPY NUMBER PER CELL 2.20 REFERENCE RANGE NEGATIVE HER2/Chr17 Ratio <2.0 and Average HER2 copy number <4.0 EQUIVOCAL HER2/Chr17 Ratio <2.0 and Average HER2 copy number 4.0 and <6.0 POSITIVE HER2/Chr17 Ratio >=2.0 and/or Average HER2 copy number >=6.0I Shelia Picket MD Pathologist, Electronic Signature ( Signed 07/30/2012) FINAL DIAGNOSIS Diagnosis 1. Breast, lumpectomy, right 1 of 4 FINAL for Shelia Obrien, Shelia Obrien  [id number]) Diagnosis(continued) - INVASIVE LOBULAR CARCINOMA, GRADE II/III, SPANNING 1.7 CM. - LOBULAR CARCINOMA IN SITU. - PERINEURAL INVASION IS IDENTIFIED. - THE SURGICAL RESECTION MARGINS ARE NEGATIVE FOR CARCINOMA. - SEE ONCOLOGY TABLE BELOW. 2. Lymph node, sentinel, biopsy, #1 right axilla - THERE IS NO EVIDENCE OF CARCINOMA IN 1 OF 1 LYMPH NODE (0/1). 3. Lymph node, sentinel, biopsy, #2 right axilla - THERE IS NO EVIDENCE OF CARCINOMA IN 1 OF 1 LYMPH NODE (0/1). 4. Lymph node, sentinel, biopsy, #3, right axilla - THERE IS NO EVIDENCE OF CARCINOMA IN 1 OF 1 LYMPH NODE (0/1). 5. Lymph node, sentinel, biopsy, #4, right axilla - THERE IS NO EVIDENCE OF CARCINOMA IN 1 OF 1 LYMPH NODE (0/1). Microscopic Comment 1. BREAST, INVASIVE TUMOR, WITH LYMPH NODE SAMPLING Specimen, including laterality: Right breast. Procedure: Lumpectomy. Grade: II. Tubule formation: 3. Nuclear pleomorphism: 2. Mitotic: 1. Tumor size (gross measurement): 1.7 cm. Margins: Invasive, distance to closest margin: 0.5 cm to the anterior and superior margins (gross measurements). Lymphovascular invasion: Not identified. Ductal carcinoma in situ: Not identified. Tumor focality: Unifocal. Treatment effect: N/A. Extent of tumor: Confined to breast parenchyma. Lymph nodes: # examined: 4. Lymph nodes with metastasis: 0. Breast prognostic profile: Case [id number]. Estrogen receptor: 93%, strong staining intensity. Progesterone receptor: 7%, strong staining intensity. HER-2/neu: No amplification was detected. The ratio was 1.04. HER-2/neu by CISH will be repeated on the current case and the results reported separately. Ki-67: 12%. Non-neoplastic breast: Healing biopsy site. TNM: pT1c, pN0. Comments: Immunohistochemical stains performed on parts 2-5 fail to highlight the presence of cytokeratin positive tumor cells. (JBK:eps 07/27/12) Shelia Obrien  RADIOGRAPHIC STUDIES:  No results found.  ASSESSMENT:  72 year old female with  #1 new diagnosis of invasive lobular carcinoma measuring 1.7 cm node negative ER positive PR positive HER-2/neu negative with Ki-67 low. Patient had Oncotype DX testing performed which showed an intermediate risk category recurrence score. We discussed chemotherapy adjuvantly for 4 cycles. We discussed Taxotere and Cytoxan every 21 days for a total of 4 cycles. Side effects of treatment were discussed very clearly.  #2 she will  need chemotherapy teaching class and a Port-A-Cath placed.   PLAN:   #1 proceed with Port-A-Cath placement.  #2 chemotherapy teaching class.  #3 she will return on 5/19 for cycle 1 of her treatment.   All questions were answered. The patient knows to call the clinic with any problems, questions or concerns. We can certainly see the patient much sooner if necessary.  I spent 25 minutes counseling the patient face to face. The total time spent in the appointment was 30 minutes.    Drue Second, MD Medical/Oncology [facility] [phone] (beeper) [phone] (Office)

## 2012-08-24 ENCOUNTER — Encounter (HOSPITAL_BASED_OUTPATIENT_CLINIC_OR_DEPARTMENT_OTHER): Payer: Self-pay | Admitting: *Deleted

## 2012-08-25 ENCOUNTER — Encounter (HOSPITAL_BASED_OUTPATIENT_CLINIC_OR_DEPARTMENT_OTHER)
Admission: RE | Disposition: A | Discharge status: Home / Self Care | Payer: Self-pay | Source: Ambulatory Visit | Attending: General Surgery

## 2012-08-25 ENCOUNTER — Encounter (HOSPITAL_BASED_OUTPATIENT_CLINIC_OR_DEPARTMENT_OTHER): Payer: Self-pay | Admitting: *Deleted

## 2012-08-25 ENCOUNTER — Ambulatory Visit (HOSPITAL_COMMUNITY): Payer: Medicare Other

## 2012-08-25 ENCOUNTER — Ambulatory Visit (HOSPITAL_BASED_OUTPATIENT_CLINIC_OR_DEPARTMENT_OTHER)
Admission: RE | Admit: 2012-08-25 | Discharge: 2012-08-25 | Disposition: A | Discharge status: Home / Self Care | Payer: Medicare Other | Source: Ambulatory Visit | Attending: General Surgery | Admitting: General Surgery

## 2012-08-25 ENCOUNTER — Encounter (HOSPITAL_BASED_OUTPATIENT_CLINIC_OR_DEPARTMENT_OTHER): Payer: Self-pay | Admitting: Anesthesiology

## 2012-08-25 ENCOUNTER — Ambulatory Visit (HOSPITAL_BASED_OUTPATIENT_CLINIC_OR_DEPARTMENT_OTHER): Payer: Medicare Other | Admitting: Anesthesiology

## 2012-08-25 DIAGNOSIS — K219 Gastro-esophageal reflux disease without esophagitis: Secondary | ICD-10-CM | POA: Insufficient documentation

## 2012-08-25 DIAGNOSIS — E039 Hypothyroidism, unspecified: Secondary | ICD-10-CM | POA: Insufficient documentation

## 2012-08-25 DIAGNOSIS — C50919 Malignant neoplasm of unspecified site of unspecified female breast: Secondary | ICD-10-CM

## 2012-08-25 DIAGNOSIS — J4489 Other specified chronic obstructive pulmonary disease: Secondary | ICD-10-CM | POA: Insufficient documentation

## 2012-08-25 DIAGNOSIS — J449 Chronic obstructive pulmonary disease, unspecified: Secondary | ICD-10-CM | POA: Insufficient documentation

## 2012-08-25 HISTORY — DX: Shortness of breath: R06.02

## 2012-08-25 HISTORY — DX: Unspecified osteoarthritis, unspecified site: M19.90

## 2012-08-25 HISTORY — DX: Other specified postprocedural states: Z98.890

## 2012-08-25 HISTORY — DX: Other specified postprocedural states: R11.2

## 2012-08-25 HISTORY — DX: Presence of spectacles and contact lenses: Z97.3

## 2012-08-25 HISTORY — PX: PORTACATH PLACEMENT: SHX2246

## 2012-08-25 SURGERY — INSERTION, TUNNELED CENTRAL VENOUS DEVICE, WITH PORT
Anesthesia: General | Laterality: Left | Wound class: Clean

## 2012-08-25 MED ORDER — OXYCODONE-ACETAMINOPHEN 5-325 MG PO TABS: 1.0000 | ORAL_TABLET | ORAL | Status: DC | PRN

## 2012-08-25 MED ORDER — MIDAZOLAM HCL 2 MG/2ML IJ SOLN: 1.0000 mg | INTRAMUSCULAR | Status: DC | PRN

## 2012-08-25 MED ORDER — FENTANYL CITRATE 0.05 MG/ML IJ SOLN: 25.0000 ug | INTRAMUSCULAR | Status: DC | PRN

## 2012-08-25 MED ORDER — BUPIVACAINE-EPINEPHRINE 0.5% -1:200000 IJ SOLN
INTRAMUSCULAR | Status: DC | PRN
  Administered 2012-08-25: 10 mL

## 2012-08-25 MED ORDER — HEPARIN SOD (PORK) LOCK FLUSH 100 UNIT/ML IV SOLN
INTRAVENOUS | Status: DC | PRN
  Administered 2012-08-25: 500 [IU] via INTRAVENOUS

## 2012-08-25 MED ORDER — CEFAZOLIN SODIUM-DEXTROSE 2-3 GM-% IV SOLR
2.0000 g | INTRAVENOUS | Status: AC
  Administered 2012-08-25: 2 g via INTRAVENOUS

## 2012-08-25 MED ORDER — PROPOFOL 10 MG/ML IV BOLUS
INTRAVENOUS | Status: DC | PRN
  Administered 2012-08-25: 200 mg via INTRAVENOUS

## 2012-08-25 MED ORDER — ONDANSETRON HCL 4 MG/2ML IJ SOLN
INTRAMUSCULAR | Status: DC | PRN
  Administered 2012-08-25: 4 mg via INTRAVENOUS

## 2012-08-25 MED ORDER — OXYCODONE HCL 5 MG PO TABS
5.0000 mg | ORAL_TABLET | Freq: Once | ORAL | Status: AC | PRN
  Administered 2012-08-25: 5 mg via ORAL

## 2012-08-25 MED ORDER — FENTANYL CITRATE 0.05 MG/ML IJ SOLN
INTRAMUSCULAR | Status: DC | PRN
  Administered 2012-08-25: 50 ug via INTRAVENOUS

## 2012-08-25 MED ORDER — FENTANYL CITRATE 0.05 MG/ML IJ SOLN: 50.0000 ug | INTRAMUSCULAR | Status: DC | PRN

## 2012-08-25 MED ORDER — LACTATED RINGERS IV SOLN
INTRAVENOUS | Status: DC
  Administered 2012-08-25: 09:00:00 via INTRAVENOUS

## 2012-08-25 MED ORDER — MIDAZOLAM HCL 5 MG/5ML IJ SOLN
INTRAMUSCULAR | Status: DC | PRN
  Administered 2012-08-25: 1 mg via INTRAVENOUS

## 2012-08-25 MED ORDER — OXYCODONE HCL 5 MG/5ML PO SOLN: 5.0000 mg | Freq: Once | ORAL | Status: AC | PRN

## 2012-08-25 MED ORDER — LIDOCAINE HCL (CARDIAC) 20 MG/ML IV SOLN
INTRAVENOUS | Status: DC | PRN
  Administered 2012-08-25: 75 mg via INTRAVENOUS

## 2012-08-25 MED ORDER — EPHEDRINE SULFATE 50 MG/ML IJ SOLN
INTRAMUSCULAR | Status: DC | PRN
  Administered 2012-08-25: 10 mg via INTRAVENOUS

## 2012-08-25 MED ORDER — HEPARIN (PORCINE) IN NACL 2-0.9 UNIT/ML-% IJ SOLN
INTRAMUSCULAR | Status: DC | PRN
  Administered 2012-08-25: 2 mL via INTRAVENOUS

## 2012-08-25 SURGICAL SUPPLY — 41 items
ADH SKN CLS APL DERMABOND .7 (GAUZE/BANDAGES/DRESSINGS) ×1
BAG DECANTER FOR FLEXI CONT (MISCELLANEOUS) ×2 IMPLANT
BLADE HEX COATED 2.75 (ELECTRODE) ×2 IMPLANT
BLADE SURG 11 STRL SS (BLADE) ×2 IMPLANT
BLADE SURG 15 STRL LF DISP TIS (BLADE) ×1 IMPLANT
BLADE SURG 15 STRL SS (BLADE) ×2
CHLORAPREP W/TINT 26ML (MISCELLANEOUS) ×2 IMPLANT
CLOTH BEACON ORANGE TIMEOUT ST (SAFETY) ×2 IMPLANT
COVER MAYO STAND STRL (DRAPES) ×2 IMPLANT
COVER TABLE BACK 60X90 (DRAPES) ×2 IMPLANT
DECANTER SPIKE VIAL GLASS SM (MISCELLANEOUS) IMPLANT
DERMABOND ADVANCED (GAUZE/BANDAGES/DRESSINGS) ×1
DERMABOND ADVANCED .7 DNX12 (GAUZE/BANDAGES/DRESSINGS) ×1 IMPLANT
DRAPE C-ARM 42X72 X-RAY (DRAPES) ×2 IMPLANT
DRAPE LAPAROTOMY TRNSV 102X78 (DRAPE) ×2 IMPLANT
DRAPE UTILITY XL STRL (DRAPES) ×2 IMPLANT
DRSG TEGADERM 2-3/8X2-3/4 SM (GAUZE/BANDAGES/DRESSINGS) ×2 IMPLANT
DRSG TEGADERM 4X4.75 (GAUZE/BANDAGES/DRESSINGS) ×3 IMPLANT
ELECT REM PT RETURN 9FT ADLT (ELECTROSURGICAL) ×2
ELECTRODE REM PT RTRN 9FT ADLT (ELECTROSURGICAL) ×1 IMPLANT
GLOVE BIO SURGEON STRL SZ 6 (GLOVE) ×2 IMPLANT
GLOVE BIOGEL PI IND STRL 6.5 (GLOVE) ×1 IMPLANT
GLOVE BIOGEL PI INDICATOR 6.5 (GLOVE) ×1
GLOVE ECLIPSE 6.5 STRL STRAW (GLOVE) ×2 IMPLANT
GOWN PREVENTION PLUS XLARGE (GOWN DISPOSABLE) ×2 IMPLANT
GOWN PREVENTION PLUS XXLARGE (GOWN DISPOSABLE) ×2 IMPLANT
KIT PORT POWER 8FR ISP CVUE (Catheter) ×2 IMPLANT
NEEDLE HYPO 25X1 1.5 SAFETY (NEEDLE) ×2 IMPLANT
PACK BASIN DAY SURGERY FS (CUSTOM PROCEDURE TRAY) ×2 IMPLANT
PENCIL BUTTON HOLSTER BLD 10FT (ELECTRODE) ×2 IMPLANT
SLEEVE SCD COMPRESS KNEE MED (MISCELLANEOUS) ×2 IMPLANT
SPONGE GAUZE 4X4 12PLY (GAUZE/BANDAGES/DRESSINGS) ×1 IMPLANT
SUT MNCRL AB 4-0 PS2 18 (SUTURE) ×2 IMPLANT
SUT PROLENE 2 0 SH DA (SUTURE) ×4 IMPLANT
SUT VIC AB 3-0 SH 27 (SUTURE) ×2
SUT VIC AB 3-0 SH 27X BRD (SUTURE) ×1 IMPLANT
SUT VICRYL 3-0 CR8 SH (SUTURE) IMPLANT
SYR 5ML LUER SLIP (SYRINGE) ×2 IMPLANT
SYR CONTROL 10ML LL (SYRINGE) ×2 IMPLANT
TOWEL OR 17X24 6PK STRL BLUE (TOWEL DISPOSABLE) ×2 IMPLANT
TOWEL OR NON WOVEN STRL DISP B (DISPOSABLE) ×2 IMPLANT

## 2012-08-25 NOTE — Anesthesia Procedure Notes (Signed)
Procedure Name: LMA Insertion Date/Time: 08/25/2012 9:45 AM Performed by: Zenia Resides D Pre-anesthesia Checklist: Patient identified, Emergency Drugs available, Suction available and Patient being monitored Patient Re-evaluated:Patient Re-evaluated prior to inductionOxygen Delivery Method: Circle System Utilized Preoxygenation: Pre-oxygenation with 100% oxygen Intubation Type: IV induction Ventilation: Mask ventilation without difficulty LMA: LMA inserted LMA Size: 3.0 Number of attempts: 1 Airway Equipment and Method: bite block Placement Confirmation: positive ETCO2 Tube secured with: Tape Dental Injury: Teeth and Oropharynx as per pre-operative assessment

## 2012-08-25 NOTE — Interval H&P Note (Signed)
History and Physical Interval Note:  08/25/2012 9:26 AM  Shelia Obrien  has presented today for surgery, with the diagnosis of breast cancer  The various methods of treatment have been discussed with the patient and family. After consideration of risks, benefits and other options for treatment, the patient has consented to  Procedure(s): INSERTION PORT-A-CATH (N/A) as a surgical intervention .  The patient's history has been reviewed, patient examined, no change in status, stable for surgery.  I have reviewed the patient's chart and labs.  Questions were answered to the patient's satisfaction.     [name]

## 2012-08-25 NOTE — Anesthesia Preprocedure Evaluation (Signed)
Anesthesia Evaluation  Patient identified by MRN, date of birth, ID band Patient awake    Reviewed: Allergy & Precautions, H&P , NPO status , Patient's Chart, lab work & pertinent test results, reviewed documented beta blocker date and time   History of Anesthesia Complications (+) PONV  Airway Mallampati: II TM Distance: >3 FB Neck ROM: Full    Dental  (+) Caps, Teeth Intact and Dental Advisory Given   Pulmonary neg pulmonary ROS, shortness of breath, COPD breath sounds clear to auscultation        Cardiovascular negative cardio ROS  Rhythm:Regular Rate:Normal     Neuro/Psych    GI/Hepatic Neg liver ROS, GERD-  Medicated and Controlled,  Endo/Other  Hypothyroidism   Renal/GU negative Renal ROS     Musculoskeletal   Abdominal   Peds  Hematology negative hematology ROS (+)   Anesthesia Other Findings   Reproductive/Obstetrics                           Anesthesia Physical Anesthesia Plan  ASA: II  Anesthesia Plan: General   Post-op Pain Management:    Induction: Intravenous  Airway Management Planned: LMA  Additional Equipment:   Intra-op Plan:   Post-operative Plan: Extubation in OR  Informed Consent: I have reviewed the patients History and Physical, chart, labs and discussed the procedure including the risks, benefits and alternatives for the proposed anesthesia with the patient or authorized representative who has indicated his/her understanding and acceptance.     Plan Discussed with: CRNA and Surgeon  Anesthesia Plan Comments:         Anesthesia Quick Evaluation

## 2012-08-25 NOTE — H&P (View-Only) (Signed)
HISTORY: Pt is 2 weeks post op from [facility] for right breast cancer.  She is off narcotics.  She is having minimal soreness.  She denies difficulty with mobility.  She has an area of soreness on right upper arm.      EXAM: General:  Alert and oriented Incision:  Healing well.  No evidence of cellulitis or clinically significant seroma/hematoma.   PATHOLOGY: INVASIVE LOBULAR CARCINOMA, GRADE II/III, SPANNING 1.7 CM. - LOBULAR CARCINOMA IN SITU. - PERINEURAL INVASION IS IDENTIFIED. - THE SURGICAL RESECTION MARGINS ARE NEGATIVE FOR CARCINOMA. - SEE ONCOLOGY TABLE BELOW. 2. Lymph node, sentinel, biopsy, #1 right axilla - THERE IS NO EVIDENCE OF CARCINOMA IN 1 OF 1 LYMPH NODE (0/1). 3. Lymph node, sentinel, biopsy, #2 right axilla - THERE IS NO EVIDENCE OF CARCINOMA IN 1 OF 1 LYMPH NODE (0/1). 4. Lymph node, sentinel, biopsy, #3, right axilla - THERE IS NO EVIDENCE OF CARCINOMA IN 1 OF 1 LYMPH NODE (0/1). 5. Lymph node, sentinel, biopsy, #4, right axilla - THERE IS NO EVIDENCE OF CARCINOMA IN 1 OF 1 LYMPH NODE (0/1).   ASSESSMENT AND PLAN:   Cancer of lower-inner quadrant of female breast Pt doing well post op without evidence of surgical complications.  Follow up in 3 months.  Pt set up to see Dr. Welton Flakes.  Has 1.7 cm tumor, but recurrence score of 29.  Will see if she is to receive chemo or not.        Maudry Diego, MD Surgical Oncology, General & Endocrine Surgery [facility]  [name], MD No ref. provider found

## 2012-08-25 NOTE — Op Note (Signed)
PREOPERATIVE DIAGNOSIS:  Right breast cancer     POSTOPERATIVE DIAGNOSIS:  Same     PROCEDURE: left subclavian ultrasound-guided port placement, Bard   Power Port, MRI safe, 8-French.      SURGEON:  Almond Lint, MD      ANESTHESIA:  General   FINDINGS:  Good venous return, easy flush, and tip of the catheter and   SVC 19 cm.      SPECIMEN:  None.      ESTIMATED BLOOD LOSS:  Minimal.      COMPLICATIONS:  None known.      PROCEDURE:  Pt was identified in the holding area and taken to   the operating room, where patient was placed supine on the operating room   table.  MAC anesthesia was induced.  Patient's arms were tucked and the upper   chest and neck were prepped and draped in sterile fashion.  Time-out was   performed according to the surgical safety check list.  When all was   correct, we continued.   Local anesthetic was administered over this   area at the angle of the clavicle.  The vein was accessed with 1 pass of the needle. There was good venous return and the wire passed easily with no ectopy.   Fluoroscopy was used to confirm that the wire was in the vena cava.      The patient was placed back level and the area for the pocket was anethetized   with local anesthetic.  A 3-cm transverse incision was made with a #15   blade.  Cautery was used to divide the subcutaneous tissues down to the   pectoralis muscle.  An [employer] retractor was used to elevate the skin   while a pocket was created on top of the pectoralis fascia.  The port   was placed into the pocket to confirm that it was of adequate size.  The   catheter was preattached to the port.  The port was then secured to the   pectoralis fascia with four 2-0 Prolene sutures.  These were clamped and   not tied down yet.    The catheter was tunneled through to the wire exit   site.  The catheter was placed along the wire to determine what length it should be to be in the SVC.  The catheter was cut at 19 cm.  The  tunneler sheath and dilator were passed over the wire and the dilator and wire were removed.  The catheter was advanced through the tunneler sheath and the tunneler sheath was pulled away.  Care was taken to keep the catheter in the tunneler sheath as this occurred. This was advanced and the tunneler sheath was removed.  There was good venous   return and easy flush of the catheter.  The Prolene sutures were tied   down to the pectoral fascia.  The skin was reapproximated using 3-0   Vicryl interrupted deep dermal sutures.    Fluoroscopy was used to re-confirm good position of the catheter.  The skin   was then closed using 4-0 Monocryl in a subcuticular fashion.  The port was flushed with concentrated heparin flush as well.  The wounds were then cleaned, dried, and dressed with Dermabond.  The patient was awakened from anesthesia and taken to the PACU in stable condition.  Needle, sponge, and instrument counts were correct.               Almond Lint, MD

## 2012-08-25 NOTE — Anesthesia Postprocedure Evaluation (Signed)
  Anesthesia Post-op Note  Patient: Shelia Obrien  Procedure(s) Performed: Procedure(s): INSERTION PORT-A-CATH (Left)  Patient Location: PACU  Anesthesia Type:General  Level of Consciousness: awake  Airway and Oxygen Therapy: Patient Spontanous Breathing  Post-op Pain: mild  Post-op Assessment: Post-op Vital signs reviewed, Patient's Cardiovascular Status Stable, Respiratory Function Stable, Patent Airway, No signs of Nausea or vomiting and Pain level controlled  Post-op Vital Signs: stable  Complications: No apparent anesthesia complications

## 2012-08-25 NOTE — Transfer of Care (Signed)
Immediate Anesthesia Transfer of Care Note  Patient: Shelia Obrien  Procedure(s) Performed: Procedure(s): INSERTION PORT-A-CATH (N/A)  Patient Location: PACU  Anesthesia Type:General  Level of Consciousness: awake, alert  and oriented  Airway & Oxygen Therapy: Patient Spontanous Breathing and Patient connected to face mask oxygen  Post-op Assessment: Report given to PACU RN and Post -op Vital signs reviewed and stable  Post vital signs: Reviewed and stable  Complications: No apparent anesthesia complications

## 2012-08-26 ENCOUNTER — Encounter (HOSPITAL_BASED_OUTPATIENT_CLINIC_OR_DEPARTMENT_OTHER): Payer: Self-pay | Admitting: General Surgery

## 2012-08-26 LAB — POCT HEMOGLOBIN-HEMACUE: Hemoglobin: 12.9 g/dL (ref 12.0–15.0)

## 2012-08-27 ENCOUNTER — Ambulatory Visit (HOSPITAL_BASED_OUTPATIENT_CLINIC_OR_DEPARTMENT_OTHER): Payer: Medicare Other | Admitting: Oncology

## 2012-08-27 ENCOUNTER — Other Ambulatory Visit (HOSPITAL_BASED_OUTPATIENT_CLINIC_OR_DEPARTMENT_OTHER): Payer: Medicare Other | Admitting: Lab

## 2012-08-27 ENCOUNTER — Ambulatory Visit (HOSPITAL_BASED_OUTPATIENT_CLINIC_OR_DEPARTMENT_OTHER): Payer: Medicare Other

## 2012-08-27 ENCOUNTER — Encounter: Payer: Self-pay | Admitting: Oncology

## 2012-08-27 VITALS — BP 144/84 | HR 76 | Temp 98.5°F | Resp 20 | Ht 63.0 in | Wt 156.3 lb

## 2012-08-27 VITALS — BP 145/69 | HR 88 | Temp 97.0°F | Resp 16

## 2012-08-27 DIAGNOSIS — C50311 Malignant neoplasm of lower-inner quadrant of right female breast: Secondary | ICD-10-CM

## 2012-08-27 DIAGNOSIS — C50319 Malignant neoplasm of lower-inner quadrant of unspecified female breast: Secondary | ICD-10-CM

## 2012-08-27 DIAGNOSIS — L659 Nonscarring hair loss, unspecified: Secondary | ICD-10-CM

## 2012-08-27 DIAGNOSIS — Z170 Estrogen receptor positive status [ER+]: Secondary | ICD-10-CM

## 2012-08-27 DIAGNOSIS — Z5111 Encounter for antineoplastic chemotherapy: Secondary | ICD-10-CM

## 2012-08-27 LAB — COMPREHENSIVE METABOLIC PANEL (CC13)
AST: 24 U/L (ref 5–34)
Albumin: 3.9 g/dL (ref 3.5–5.0)
Alkaline Phosphatase: 47 U/L (ref 40–150)
BUN: 15.4 mg/dL (ref 7.0–26.0)
Calcium: 9.4 mg/dL (ref 8.4–10.4)
Chloride: 107 mEq/L (ref 98–107)
Potassium: 3.9 mEq/L (ref 3.5–5.1)
Sodium: 141 mEq/L (ref 136–145)
Total Protein: 7 g/dL (ref 6.4–8.3)

## 2012-08-27 LAB — CBC WITH DIFFERENTIAL/PLATELET
BASO%: 0.1 % (ref 0.0–2.0)
Basophils Absolute: 0 10*3/uL (ref 0.0–0.1)
HCT: 41.6 % (ref 34.8–46.6)
HGB: 14.3 g/dL (ref 11.6–15.9)
MCHC: 34.4 g/dL (ref 31.5–36.0)
MONO#: 1.4 10*3/uL — ABNORMAL HIGH (ref 0.1–0.9)
NEUT%: 79.2 % — ABNORMAL HIGH (ref 38.4–76.8)
RDW: 13.5 % (ref 11.2–14.5)
WBC: 18.3 10*3/uL — ABNORMAL HIGH (ref 3.9–10.3)
lymph#: 2.4 10*3/uL (ref 0.9–3.3)

## 2012-08-27 MED ORDER — SODIUM CHLORIDE 0.9 % IJ SOLN
10.0000 mL | INTRAMUSCULAR | Status: DC | PRN
  Administered 2012-08-27: 10 mL
  Filled 2012-08-27: qty 10

## 2012-08-27 MED ORDER — SODIUM CHLORIDE 0.9 % IV SOLN
600.0000 mg/m2 | Freq: Once | INTRAVENOUS | Status: AC
  Administered 2012-08-27: 1060 mg via INTRAVENOUS
  Filled 2012-08-27: qty 53

## 2012-08-27 MED ORDER — UNABLE TO FIND: 1.0000 | Status: DC | PRN

## 2012-08-27 MED ORDER — HEPARIN SOD (PORK) LOCK FLUSH 100 UNIT/ML IV SOLN
500.0000 [IU] | Freq: Once | INTRAVENOUS | Status: AC | PRN
  Administered 2012-08-27: 500 [IU]
  Filled 2012-08-27: qty 5

## 2012-08-27 MED ORDER — LORAZEPAM 2 MG/ML IJ SOLN
0.5000 mg | Freq: Once | INTRAMUSCULAR | Status: AC
  Administered 2012-08-27: 0.5 mg via INTRAVENOUS

## 2012-08-27 MED ORDER — SODIUM CHLORIDE 0.9 % IV SOLN
Freq: Once | INTRAVENOUS | Status: AC
  Administered 2012-08-27: 10:00:00 via INTRAVENOUS

## 2012-08-27 MED ORDER — SODIUM CHLORIDE 0.9 % IV SOLN
75.0000 mg/m2 | Freq: Once | INTRAVENOUS | Status: AC
  Administered 2012-08-27: 130 mg via INTRAVENOUS
  Filled 2012-08-27: qty 13

## 2012-08-27 MED ORDER — PROCHLORPERAZINE 25 MG RE SUPP: 25.0000 mg | Freq: Two times a day (BID) | RECTAL | Status: DC | PRN

## 2012-08-27 MED ORDER — ONDANSETRON 16 MG/50ML IVPB (CHCC)
16.0000 mg | Freq: Once | INTRAVENOUS | Status: AC
  Administered 2012-08-27: 16 mg via INTRAVENOUS

## 2012-08-27 MED ORDER — DEXAMETHASONE SODIUM PHOSPHATE 20 MG/5ML IJ SOLN
20.0000 mg | Freq: Once | INTRAMUSCULAR | Status: AC
  Administered 2012-08-27: 20 mg via INTRAVENOUS

## 2012-08-27 NOTE — Patient Instructions (Addendum)
#  1 proceed with Taxotere and Cytoxan today. I have added Ativan to help you relax.  #2 continue to take her antiemetics as recommended.  #3 he will return on Saturday for your Neulasta injection.  #4 I will plan on seeing you back in one week's time for office visit and blood work.

## 2012-08-27 NOTE — Progress Notes (Signed)
OFFICE PROGRESS NOTE  CC  Shelia Kayser, MD [address] [address] Dr. Almond Lint  Dr. 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   STAGE:  Cancer of lower-inner quadrant of female breast  Primary site: Breast (Right)  Staging method: AJCC 7th Edition  Clinical: Stage IA (T1c, N0, cM0)  Summary: Stage IA (T1c, N0, cM0)   PRIOR THERAPY: #1 patient originally presented at the [facility] with a mammogram that showed architectural distortion. Ultrasound showed a 1.2 cm mass in the 4:00 position of the right breast. MRI confirmed a solitary lesion measuring 1.6 cm up. 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  #2 patient is status post lumpectomy with sentinel lymph node biopsy the final pathology revealed a 1.7 cm invasive lobular carcinoma. Tumor was ER positive PR positive HER-2/neu negative with a Ki-67 of 12% 4 sentinel nodes were negative for metastatic disease.  #3 patient had Oncotype DX testing performed her recurrence score was 29 giving her a 19% risk of distant recurrence with adjuvant hormonal therapy only. Patient and I and her family discussed her Oncotype DX testing this puts her in the intermediate risk category. We discussed side effects of chemotherapy types of chemotherapy. I would recommend Taxotere and Cytoxan every 3 weeks for 4 cycles. We can certainly begin this as soon as the Port-A-Cath is placed.  CURRENT THERAPY:adjuvant chemotherapy consisting of cycle 1 Taxotere Cytoxan q. 21 days  08/30/2012  INTERVAL HISTORY: Shelia Obrien 72 y.o. female returns for followup visit today. Overall she's doing well. She has taken her dexamethasone as scheduled. She is denying any nausea or vomiting. She has no fevers chills or night sweats. Her Port-A-Cath site looks good. She has no aches  pains no bleeding problems. Remainder of the 10 point review of systems is negative.  MEDICAL HISTORY: Past Medical History  Diagnosis Date  . Bronchitis   . Thyroid disease   . Acid reflux disease   . High cholesterol   . Breast cancer   . Seasonal allergies   . Hypothyroidism   . Leaking of urine     Dribbles if coughs. Pt wears pad  . Epistaxis     went to [facility] ER  . Shortness of breath   . PONV (postoperative nausea and vomiting)   . Wears glasses   . Arthritis   . CHF (congestive heart failure)     ALLERGIES:  has No Known Allergies.  MEDICATIONS:  Current Outpatient Prescriptions  Medication Sig Dispense Refill  . b complex vitamins tablet Take 1 tablet by mouth daily.      Marland Kitchen dexamethasone (DECADRON) 4 MG tablet Take 2 tablets (8 mg total) by mouth 2 (two) times daily with a meal. Take two times a day the day before Taxotere. Then take two times a day starting the day after chemo for 3 days.  30 tablet  1  . gabapentin (NEURONTIN) 100 MG capsule Take 100-300 mg by mouth daily as needed (for pain). Nerve pain      . levothyroxine (SYNTHROID, LEVOTHROID) 75 MCG tablet Take 75 mcg by mouth daily.      Marland Kitchen lidocaine-prilocaine (EMLA) cream Apply topically as needed.  30 g  7  . pantoprazole (PROTONIX) 40 MG tablet Take 40 mg by mouth daily.      . rosuvastatin (  CRESTOR) 10 MG tablet Take 10 mg by mouth daily.      . Vitamin D, Ergocalciferol, (DRISDOL) 50000 UNITS CAPS Take 50,000 Units by mouth every 7 (seven) days. Pt takes on Wed      . beclomethasone (QVAR) 40 MCG/ACT inhaler Inhale 2 puffs into the lungs 2 (two) times daily as needed (for shortness of breath).       . fluticasone (FLONASE) 50 MCG/ACT nasal spray Place 2 sprays into the nose daily.      Marland Kitchen LORazepam (ATIVAN) 0.5 MG tablet Take 1 tablet (0.5 mg total) by mouth every 6 (six) hours as needed (Nausea or vomiting).  30 tablet  0  . ondansetron (ZOFRAN) 8 MG tablet Take 1 tablet (8 mg total) by mouth 2  (two) times daily. Take two times a day starting the day after chemo for 3 days. Then take two times a day as needed for nausea or vomiting.  30 tablet  1  . OVER THE COUNTER MEDICATION Take 5,000 Units by mouth once a week.      Marland Kitchen oxyCODONE-acetaminophen (ROXICET) 5-325 MG per tablet Take 1-2 tablets by mouth every 4 (four) hours as needed for pain.  30 tablet  0  . prochlorperazine (COMPAZINE) 10 MG tablet Take 1 tablet (10 mg total) by mouth every 6 (six) hours as needed (Nausea or vomiting).  30 tablet  1  . prochlorperazine (COMPAZINE) 25 MG suppository Place 1 suppository (25 mg total) rectally every 12 (twelve) hours as needed for nausea.  12 suppository  3  . valACYclovir (VALTREX) 500 MG tablet Take 500 mg by mouth 3 (three) times daily as needed (for blisters).        No current facility-administered medications for this visit.    SURGICAL HISTORY:  Past Surgical History  Procedure Laterality Date  . Abdominal hysterectomy    . Cholecystectomy    . Upper gi endoscopy    . Colonoscopy    . Knee arthroscopy Left   . Nasal septum surgery    . Breast lumpectomy with needle localization and axillary sentinel lymph node bx Right 07/22/2012    Procedure: BREAST LUMPECTOMY WITH NEEDLE LOCALIZATION AND AXILLARY SENTINEL LYMPH NODE BX;  Surgeon: Almond Lint, MD;  Location: [facility] OR;  Service: General;  Laterality: Right;  . Portacath placement Left 08/25/2012    Procedure: INSERTION PORT-A-CATH;  Surgeon: Almond Lint, MD;  Location: [facility];  Service: General;  Laterality: Left;    REVIEW OF SYSTEMS:  Pertinent items are noted in HPI.   HEALTH MAINTENANCE:   PHYSICAL EXAMINATION: Blood pressure 144/84, pulse 76, temperature 98.5 F (36.9 C), temperature source Oral, resp. rate 20, height 5\' 3"  (1.6 m), weight 156 lb 4.8 oz (70.897 kg). Body mass index is 27.69 kg/(m^2). ECOG PERFORMANCE STATUS: 0 - Asymptomatic   well-developed older his female in no acute  distress HEENT exam EOMI PERRLA sclerae anicteric no conjunctival pallor oral mucosa is moist neck is supple lungs are clear to auscultation cardiovascular is regular rate rhythm abdomen is soft nontender nondistended bowel sounds are present no HSM extremities no edema neuro patient's alert oriented otherwise nonfocal right lumpectomy site looks well-healed no skin changes no erythema no nodularity.   LABORATORY DATA: Lab Results  Component Value Date   WBC 18.3* 08/27/2012   HGB 14.3 08/27/2012   HCT 41.6 08/27/2012   MCV 90.4 08/27/2012   PLT 200 08/27/2012      Chemistry      Component  Value Date/Time   NA 140 07/16/2012 1040   NA 141 07/07/2012 1204   K 4.8 07/16/2012 1040   K 4.3 07/07/2012 1204   CL 108 07/16/2012 1040   CL 105 07/07/2012 1204   CO2 24 07/16/2012 1040   CO2 26 07/07/2012 1204   BUN 13 07/16/2012 1040   BUN 20.7 07/07/2012 1204   CREATININE 1.05 07/16/2012 1040   CREATININE 1.0 07/07/2012 1204      Component Value Date/Time   CALCIUM 9.2 07/16/2012 1040   CALCIUM 10.0 07/07/2012 1204   ALKPHOS 54 07/07/2012 1204   AST 33 07/07/2012 1204   ALT 28 07/07/2012 1204   BILITOT 0.74 07/07/2012 1204     ADDITIONAL INFORMATION: 1. A sample (block 1A) was sent to [facility] for Oncotype testing. The patient's recurrence score is 29. Those patients who had a recurrence score of 29 had an average rate of distant recurrence of 19%. (JBK:caf 08/06/12) Pecola Leisure MD Pathologist, Electronic Signature ( Signed 08/06/2012) 1. CHROMOGENIC IN-SITU HYBRIDIZATION Results: HER-2/NEU BY CISH - NO AMPLIFICATION OF HER-2 DETECTED. RESULT RATIO OF HER2: CEP 17 SIGNALS 0.94 AVERAGE HER2 COPY NUMBER PER CELL 2.20 REFERENCE RANGE NEGATIVE HER2/Chr17 Ratio <2.0 and Average HER2 copy number <4.0 EQUIVOCAL HER2/Chr17 Ratio <2.0 and Average HER2 copy number 4.0 and <6.0 POSITIVE HER2/Chr17 Ratio >=2.0 and/or Average HER2 copy number >=6.0I Jimmy Picket MD Pathologist, Electronic Signature (  Signed 07/30/2012) FINAL DIAGNOSIS Diagnosis 1. Breast, lumpectomy, right 1 of 4 FINAL for CHARMANE, PROTZMAN D [phone]) Diagnosis(continued) - INVASIVE LOBULAR CARCINOMA, GRADE II/III, SPANNING 1.7 CM. - LOBULAR CARCINOMA IN SITU. - PERINEURAL INVASION IS IDENTIFIED. - THE SURGICAL RESECTION MARGINS ARE NEGATIVE FOR CARCINOMA. - SEE ONCOLOGY TABLE BELOW. 2. Lymph node, sentinel, biopsy, #1 right axilla - THERE IS NO EVIDENCE OF CARCINOMA IN 1 OF 1 LYMPH NODE (0/1). 3. Lymph node, sentinel, biopsy, #2 right axilla - THERE IS NO EVIDENCE OF CARCINOMA IN 1 OF 1 LYMPH NODE (0/1). 4. Lymph node, sentinel, biopsy, #3, right axilla - THERE IS NO EVIDENCE OF CARCINOMA IN 1 OF 1 LYMPH NODE (0/1). 5. Lymph node, sentinel, biopsy, #4, right axilla - THERE IS NO EVIDENCE OF CARCINOMA IN 1 OF 1 LYMPH NODE (0/1). Microscopic Comment 1. BREAST, INVASIVE TUMOR, WITH LYMPH NODE SAMPLING Specimen, including laterality: Right breast. Procedure: Lumpectomy. Grade: II. Tubule formation: 3. Nuclear pleomorphism: 2. Mitotic: 1. Tumor size (gross measurement): 1.7 cm. Margins: Invasive, distance to closest margin: 0.5 cm to the anterior and superior margins (gross measurements). Lymphovascular invasion: Not identified. Ductal carcinoma in situ: Not identified. Tumor focality: Unifocal. Treatment effect: N/A. Extent of tumor: Confined to breast parenchyma. Lymph nodes: # examined: 4. Lymph nodes with metastasis: 0. Breast prognostic profile: Case [id number]. Estrogen receptor: 93%, strong staining intensity. Progesterone receptor: 7%, strong staining intensity. HER-2/neu: No amplification was detected. The ratio was 1.04. HER-2/neu by CISH will be repeated on the current case and the results reported separately. Ki-67: 12%. Non-neoplastic breast: Healing biopsy site. TNM: pT1c, pN0. Comments: Immunohistochemical stains performed on parts 2-5 fail to highlight the presence of  cytokeratin positive tumor cells. (JBK:eps 07/27/12) JOSHUA  RADIOGRAPHIC STUDIES:  No results found.  ASSESSMENT: 72 year old female with  #1 new diagnosis of invasive lobular carcinoma measuring 1.7 cm node negative ER positive PR positive HER-2/neu negative with Ki-67 low. Patient had Oncotype DX testing performed which showed an intermediate risk category recurrence score. We discussed chemotherapy adjuvantly for 4 cycles. We discussed Taxotere and Cytoxan every 21 days  for a total of 4 cycles. Side effects of treatment were discussed very clearly.  #2 today patient will proceed with cycle 1 of her chemotherapy. She is receiving Taxotere and Cytoxan. She understands the side effects and benefits for it.  #3 she was given a prescription for a cranial prosthesis. She has all of her antiemetics. She was taught how to take these.   PLAN:   #1 proceed with cycle 1 day 1 of Taxotere and Cytoxan. She will return tomorrow for Neulasta injection.  #2 I will see her back in one week's time for labs and followup.  All questions were answered. The patient knows to call the clinic with any problems, questions or concerns. We can certainly see the patient much sooner if necessary.  I spent 25 minutes counseling the patient face to face. The total time spent in the appointment was 30 minutes.    Drue Second, MD Medical/Oncology [facility] [phone] (beeper) [phone] (Office)

## 2012-08-27 NOTE — Patient Instructions (Addendum)
[facility] Discharge Instructions for Patients Receiving Chemotherapy  Today you received the following chemotherapy agents:  Taxotere and Cytoxan  To help prevent nausea and vomiting after your treatment, we encourage you to take your nausea medication as ordered per MD.    If you develop nausea and vomiting that is not controlled by your nausea medication, call the clinic. If it is after clinic hours your family physician or the after hours number for the clinic or go to the Emergency Department.   BELOW ARE SYMPTOMS THAT SHOULD BE REPORTED IMMEDIATELY:  *FEVER GREATER THAN 100.5 F  *CHILLS WITH OR WITHOUT FEVER  NAUSEA AND VOMITING THAT IS NOT CONTROLLED WITH YOUR NAUSEA MEDICATION  *UNUSUAL SHORTNESS OF BREATH  *UNUSUAL BRUISING OR BLEEDING  TENDERNESS IN MOUTH AND THROAT WITH OR WITHOUT PRESENCE OF ULCERS  *URINARY PROBLEMS  *BOWEL PROBLEMS  UNUSUAL RASH Items with * indicate a potential emergency and should be followed up as soon as possible.   Feel free to call the clinic you have any questions or concerns. The clinic phone number is [phone].

## 2012-08-28 ENCOUNTER — Ambulatory Visit (HOSPITAL_BASED_OUTPATIENT_CLINIC_OR_DEPARTMENT_OTHER): Payer: Medicare Other

## 2012-08-28 VITALS — BP 144/80 | HR 82 | Temp 97.3°F | Resp 20

## 2012-08-28 DIAGNOSIS — Z5189 Encounter for other specified aftercare: Secondary | ICD-10-CM

## 2012-08-28 DIAGNOSIS — C50319 Malignant neoplasm of lower-inner quadrant of unspecified female breast: Secondary | ICD-10-CM

## 2012-08-28 MED ORDER — PEGFILGRASTIM INJECTION 6 MG/0.6ML
6.0000 mg | Freq: Once | SUBCUTANEOUS | Status: AC
  Administered 2012-08-28: 6 mg via SUBCUTANEOUS

## 2012-08-30 ENCOUNTER — Ambulatory Visit (INDEPENDENT_AMBULATORY_CARE_PROVIDER_SITE_OTHER): Payer: Medicare Other | Admitting: General Surgery

## 2012-08-30 ENCOUNTER — Encounter (INDEPENDENT_AMBULATORY_CARE_PROVIDER_SITE_OTHER): Payer: Self-pay | Admitting: General Surgery

## 2012-08-30 VITALS — BP 132/86 | HR 92 | Temp 97.6°F | Resp 18 | Ht 63.0 in | Wt 155.4 lb

## 2012-08-30 DIAGNOSIS — C50319 Malignant neoplasm of lower-inner quadrant of unspecified female breast: Secondary | ICD-10-CM

## 2012-08-30 DIAGNOSIS — C50311 Malignant neoplasm of lower-inner quadrant of right female breast: Secondary | ICD-10-CM

## 2012-08-30 NOTE — Patient Instructions (Signed)
Follow up in 3 months for recheck.  We may be able to schedule port removal then, depending on oncology.

## 2012-08-30 NOTE — Progress Notes (Signed)
HISTORY: Patient is a 72 year old female who is status post a left-sided subclavian Port-A-Cath placement for right breast cancer. She had node-negative disease do have a higher recurrence score on her oncotype.  She is doing her well from surgical standpoint. She is no longer taking any pain medicine other than Tylenol. She is however having significant constipation. She is taking stool softener, but has not taken any laxatives.  She got her first dose of chemotherapy and tolerated that reasonably well.    EXAM: General:  Alert and oriented Incision:  Healing well, no cellulitis   PATHOLOGY:  n/a   ASSESSMENT AND PLAN:   Cancer of lower-inner quadrant of female breast Pt to finish out chemo  I will follow up in 3 months.        Maudry Diego, MD Surgical Oncology, General & Endocrine Surgery [facility]  [name], MD [name], MD

## 2012-08-30 NOTE — Assessment & Plan Note (Signed)
Pt to finish out chemo  I will follow up in 3 months.

## 2012-09-01 ENCOUNTER — Encounter (HOSPITAL_COMMUNITY): Payer: Self-pay | Admitting: Emergency Medicine

## 2012-09-01 ENCOUNTER — Emergency Department (HOSPITAL_COMMUNITY): Payer: Medicare Other

## 2012-09-01 ENCOUNTER — Inpatient Hospital Stay (HOSPITAL_COMMUNITY)
Admission: EM | Admit: 2012-09-01 | Discharge: 2012-09-04 | DRG: 392 | Disposition: A | Discharge status: Home / Self Care | Payer: Medicare Other | Attending: Internal Medicine | Admitting: Internal Medicine

## 2012-09-01 DIAGNOSIS — E039 Hypothyroidism, unspecified: Secondary | ICD-10-CM | POA: Diagnosis present

## 2012-09-01 DIAGNOSIS — D6959 Other secondary thrombocytopenia: Secondary | ICD-10-CM | POA: Diagnosis present

## 2012-09-01 DIAGNOSIS — K5289 Other specified noninfective gastroenteritis and colitis: Principal | ICD-10-CM | POA: Diagnosis present

## 2012-09-01 DIAGNOSIS — D709 Neutropenia, unspecified: Secondary | ICD-10-CM

## 2012-09-01 DIAGNOSIS — R7989 Other specified abnormal findings of blood chemistry: Secondary | ICD-10-CM

## 2012-09-01 DIAGNOSIS — T451X5A Adverse effect of antineoplastic and immunosuppressive drugs, initial encounter: Secondary | ICD-10-CM | POA: Diagnosis present

## 2012-09-01 DIAGNOSIS — D696 Thrombocytopenia, unspecified: Secondary | ICD-10-CM | POA: Diagnosis present

## 2012-09-01 DIAGNOSIS — R5081 Fever presenting with conditions classified elsewhere: Secondary | ICD-10-CM | POA: Diagnosis present

## 2012-09-01 DIAGNOSIS — R778 Other specified abnormalities of plasma proteins: Secondary | ICD-10-CM

## 2012-09-01 DIAGNOSIS — R748 Abnormal levels of other serum enzymes: Secondary | ICD-10-CM | POA: Diagnosis present

## 2012-09-01 DIAGNOSIS — C50319 Malignant neoplasm of lower-inner quadrant of unspecified female breast: Secondary | ICD-10-CM

## 2012-09-01 DIAGNOSIS — C50311 Malignant neoplasm of lower-inner quadrant of right female breast: Secondary | ICD-10-CM | POA: Diagnosis present

## 2012-09-01 DIAGNOSIS — R55 Syncope and collapse: Secondary | ICD-10-CM

## 2012-09-01 DIAGNOSIS — K529 Noninfective gastroenteritis and colitis, unspecified: Secondary | ICD-10-CM

## 2012-09-01 DIAGNOSIS — E785 Hyperlipidemia, unspecified: Secondary | ICD-10-CM | POA: Diagnosis present

## 2012-09-01 DIAGNOSIS — I951 Orthostatic hypotension: Secondary | ICD-10-CM | POA: Diagnosis present

## 2012-09-01 NOTE — ED Notes (Signed)
Pt is a cancer pt and had her chemo treatment on Friday and an injection treatment on Saturday  Pt started becoming weak on Monday and has had diarrhea since Tuesday and again today  Pt had two  syncopal episodes at home this evening  Family called her dr and was told to bring her in for evaluation for dehydration  Pt denies fever or vomiting

## 2012-09-01 NOTE — ED Provider Notes (Signed)
History     CSN: [id number]  Arrival date & time 09/01/12  2234   First MD Initiated Contact with Patient 09/01/12 2320      Chief Complaint  Patient presents with  . Weakness  . Loss of Consciousness    (Consider location/radiation/quality/duration/timing/severity/associated sxs/prior treatment) HPI Comments: Patient comes to the ER for evaluation of generalized weakness. Patient reports that symptoms began 2 or 3 days ago. She has had diarrhea for the last 2 days and some abdominal cramping and pain. Tonight the weakness got much worse. Family reports that she passed out twice. She has had a low-grade fever at home. There is no chest pain or shortness of breath. Family thinks he might be dehydrated because of the diarrhea. She also has not been eating or drinking for the last couple of days. There has not been any vomiting.  Patient is a 72 y.o. female presenting with weakness and syncope.  Weakness Associated symptoms include abdominal pain. Pertinent negatives include no chest pain and no shortness of breath.  Loss of Consciousness Associated symptoms: fever and weakness   Associated symptoms: no chest pain and no shortness of breath     Past Medical History  Diagnosis Date  . Bronchitis   . Thyroid disease   . Acid reflux disease   . High cholesterol   . Breast cancer   . Seasonal allergies   . Hypothyroidism   . Leaking of urine     Dribbles if coughs. Pt wears pad  . Epistaxis     went to [facility] ER  . Shortness of breath   . PONV (postoperative nausea and vomiting)   . Wears glasses   . Arthritis     Past Surgical History  Procedure Laterality Date  . Abdominal hysterectomy    . Cholecystectomy    . Upper gi endoscopy    . Colonoscopy    . Knee arthroscopy Left   . Nasal septum surgery    . Breast lumpectomy with needle localization and axillary sentinel lymph node bx Right 07/22/2012    Procedure: BREAST LUMPECTOMY WITH NEEDLE LOCALIZATION AND  AXILLARY SENTINEL LYMPH NODE BX;  Surgeon: Almond Lint, MD;  Location: [facility] OR;  Service: General;  Laterality: Right;  . Portacath placement Left 08/25/2012    Procedure: INSERTION PORT-A-CATH;  Surgeon: Almond Lint, MD;  Location: [facility];  Service: General;  Laterality: Left;    Family History  Problem Relation Age of Onset  . Prostate cancer Father   . Breast cancer Maternal Aunt   . Lung cancer Maternal Uncle   . Lung cancer Maternal Grandmother   . Kidney cancer Maternal Grandmother     History  Substance Use Topics  . Smoking status: Never Smoker   . Smokeless tobacco: Not on file  . Alcohol Use: No    OB History   Grav Para Term Preterm Abortions TAB SAB Ect Mult Living                  Review of Systems  Constitutional: Positive for fever.  Respiratory: Negative for shortness of breath.   Cardiovascular: Positive for syncope. Negative for chest pain.  Gastrointestinal: Positive for abdominal pain and diarrhea.  Neurological: Positive for syncope and weakness.  All other systems reviewed and are negative.    Allergies  Review of patient's allergies indicates no known allergies.  Home Medications   Current Outpatient Rx  Name  Route  Sig  Dispense  Refill  .  b complex vitamins tablet   Oral   Take 1 tablet by mouth daily.         Marland Kitchen dexamethasone (DECADRON) 4 MG tablet   Oral   Take 2 tablets (8 mg total) by mouth 2 (two) times daily with a meal. Take two times a day the day before Taxotere. Then take two times a day starting the day after chemo for 3 days.   30 tablet   1   . gabapentin (NEURONTIN) 100 MG capsule   Oral   Take 100-300 mg by mouth daily as needed (for pain). Nerve pain         . levothyroxine (SYNTHROID, LEVOTHROID) 75 MCG tablet   Oral   Take 75 mcg by mouth daily.         Marland Kitchen lidocaine-prilocaine (EMLA) cream   Topical   Apply topically as needed.   30 g   7   . LORazepam (ATIVAN) 0.5 MG tablet   Oral    Take 1 tablet (0.5 mg total) by mouth every 6 (six) hours as needed (Nausea or vomiting).   30 tablet   0   . ondansetron (ZOFRAN) 8 MG tablet   Oral   Take 1 tablet (8 mg total) by mouth 2 (two) times daily. Take two times a day starting the day after chemo for 3 days. Then take two times a day as needed for nausea or vomiting.   30 tablet   1   . pantoprazole (PROTONIX) 40 MG tablet   Oral   Take 40 mg by mouth daily.         . rosuvastatin (CRESTOR) 10 MG tablet   Oral   Take 10 mg by mouth daily.         . Vitamin D, Ergocalciferol, (DRISDOL) 50000 UNITS CAPS   Oral   Take 50,000 Units by mouth every 7 (seven) days. Pt takes on Wed           BP 108/69  Pulse 111  Temp(Src) 99.9 F (37.7 C) (Oral)  Resp 14  SpO2 97%  Physical Exam  Constitutional: She is oriented to person, place, and time. She appears well-developed and well-nourished. No distress.  HENT:  Head: Normocephalic and atraumatic.  Right Ear: Hearing normal.  Left Ear: Hearing normal.  Nose: Nose normal.  Mouth/Throat: Oropharynx is clear and moist and mucous membranes are normal.  Eyes: Conjunctivae and EOM are normal. Pupils are equal, round, and reactive to light.  Neck: Normal range of motion. Neck supple.  Cardiovascular: Regular rhythm, S1 normal and S2 normal.  Exam reveals no gallop and no friction rub.   No murmur heard. Pulmonary/Chest: Effort normal and breath sounds normal. No respiratory distress. She exhibits no tenderness.  Abdominal: Soft. Normal appearance and bowel sounds are normal. There is no hepatosplenomegaly. There is generalized tenderness. There is no rebound, no guarding, no tenderness at McBurney's point and negative Murphy's sign. No hernia.  Musculoskeletal: Normal range of motion.  Neurological: She is alert and oriented to person, place, and time. She has normal strength. No cranial nerve deficit or sensory deficit. Coordination normal. GCS eye subscore is 4. GCS  verbal subscore is 5. GCS motor subscore is 6.  Skin: Skin is warm, dry and intact. No rash noted. No cyanosis.  Psychiatric: She has a normal mood and affect. Her speech is normal and behavior is normal. Thought content normal.    ED Course  Procedures (including critical care time)  EKG:  Date: 09/01/2012  Rate: 111  Rhythm: sinus tachycardia  QRS Axis: normal  Intervals: normal  ST/T Wave abnormalities: normal  Conduction Disutrbances:none  Narrative Interpretation:   Old EKG Reviewed: unchanged    Labs Reviewed  CBC WITH DIFFERENTIAL - Abnormal; Notable for the following:    WBC 2.7 (*)    Platelets 98 (*)    Neutrophils Relative % 8 (*)    Lymphocytes Relative 78 (*)    Monocytes Relative 13 (*)    Neutro Abs 0.2 (*)    All other components within normal limits  COMPREHENSIVE METABOLIC PANEL - Abnormal; Notable for the following:    Sodium 134 (*)    Glucose, Bld 117 (*)    Total Protein 5.9 (*)    Albumin 2.9 (*)    Total Bilirubin 1.3 (*)    GFR calc non Af Amer 72 (*)    GFR calc Af Amer 83 (*)    All other components within normal limits  TROPONIN I - Abnormal; Notable for the following:    Troponin I 0.77 (*)    All other components within normal limits  URINALYSIS, ROUTINE W REFLEX MICROSCOPIC - Abnormal; Notable for the following:    Color, Urine AMBER (*)    All other components within normal limits  CULTURE, BLOOD (ROUTINE X 2)  CULTURE, BLOOD (ROUTINE X 2)  CLOSTRIDIUM DIFFICILE BY PCR  LIPASE, BLOOD  D-DIMER, QUANTITATIVE  TROPONIN I   Dg Chest 2 View  09/02/2012   *RADIOLOGY REPORT*  Clinical Data: Fever.  Breast cancer.  CHEST - 2 VIEW  Comparison: 08/25/2012.  Findings: The Port-A-Cath is stable.  The cardiac silhouette, mediastinal and hilar contours are unchanged.  The lungs are clear. No pleural effusion. Surgical changes involving the right breast and is are noted.  The bony thorax is intact.  IMPRESSION: No acute cardiopulmonary findings.    Original Report Authenticated By: Rudie Meyer, M.D.   Ct Abdomen Pelvis W Contrast  09/02/2012   *RADIOLOGY REPORT*  Clinical Data: Abdominal pain and diarrhea.  CT ABDOMEN AND PELVIS WITH CONTRAST  Technique:  Multidetector CT imaging of the abdomen and pelvis was performed following the standard protocol during bolus administration of intravenous contrast.  Contrast: 50mL OMNIPAQUE IOHEXOL 300 MG/ML  SOLN, OMNIPAQUE IOHEXOL 300 MG/ML  SOLN  Comparison: 04/18/2005.  Findings: The lung bases are clear.  No pleural effusion.  No pulmonary nodules.  The solid abdominal organs are unremarkable.  The gallbladder is surgically absent.  No common bile duct dilatation.  A moderate sized duodenal diverticulum is noted.  A small hiatal hernia is noted.  The stomach, duodenum and small bowel are grossly normal.  There is moderate inflammation of the colon suspicious for C difficile colitis.  Other inflammatory or infectious colitis is possible.  The aorta is normal.  The branch vessels are patent.  No mesenteric or retroperitoneal mass or adenopathy.  Small lymph nodes are noted.  The portal and splenic veins are patent.  The renal veins are patent.  The uterus is surgically absent.  No pelvic mass or adenopathy.  No free pelvic fluid collections.  The bladder is normal.  The bony structures are unremarkable.  IMPRESSION:  1.  Diffuse colitis most likely C difficile colitis. 2.  No findings for metastatic disease involving the abdomen/pelvis.   Original Report Authenticated By: Rudie Meyer, M.D.     Diagnosis: 1. Diffuse colitis, rule out C. difficile 2. Elevated troponin, rule out non-ST elevation MI  MDM  Patient presents to the ER for evaluation of generalized weakness with syncope. Patient has had poor by mouth intake which was felt to be secondary to chemotherapy. She has also been experiencing diffuse abdominal pain and diarrhea. Tonight she became so weak that she passed out twice. She did not  have chest pain or shortness of breath. Workup was initiated for dehydration as well as cardiac etiology of the syncope. Her EKG did not show any acute changes. She did, however, have an elevated troponin. This was discussed with Dr. Lurline Idol, on-call for cardiology. She felt was likely demand secondary to the other concomitant illnesses. She did not recommend heparinization or any other acute interventions at this time. Patient administered aspirin. I did add on a d-dimer. I do not think the patient has any significant evidence of PE at this time, but the elevated troponin could be secondary to PE. I did not want to perform a CT angiography tonight because she is already been given IV dye for the abdominal CT. Patient to be hydrated and can CT angiography in the morning if d-dimer is elevated.  Because patient is experiencing abdominal pain, CAT scan was ordered. This showed diffuse colitis which could be consistent with C. difficile colitis. Awaiting stool culture.  Patient is to be admitted by the hospitalist for further evaluation and treatment of the diffuse colitis as well as monitoring and serial cardiac enzymes.        Gilda Crease, MD 09/02/12 [phone]

## 2012-09-02 ENCOUNTER — Encounter (HOSPITAL_COMMUNITY): Payer: Self-pay

## 2012-09-02 ENCOUNTER — Emergency Department (HOSPITAL_COMMUNITY): Payer: Medicare Other

## 2012-09-02 DIAGNOSIS — Z5189 Encounter for other specified aftercare: Secondary | ICD-10-CM

## 2012-09-02 DIAGNOSIS — C50919 Malignant neoplasm of unspecified site of unspecified female breast: Secondary | ICD-10-CM

## 2012-09-02 DIAGNOSIS — R778 Other specified abnormalities of plasma proteins: Secondary | ICD-10-CM | POA: Diagnosis present

## 2012-09-02 DIAGNOSIS — D709 Neutropenia, unspecified: Secondary | ICD-10-CM | POA: Diagnosis present

## 2012-09-02 DIAGNOSIS — K529 Noninfective gastroenteritis and colitis, unspecified: Secondary | ICD-10-CM | POA: Diagnosis present

## 2012-09-02 DIAGNOSIS — E039 Hypothyroidism, unspecified: Secondary | ICD-10-CM | POA: Diagnosis present

## 2012-09-02 DIAGNOSIS — C50319 Malignant neoplasm of lower-inner quadrant of unspecified female breast: Secondary | ICD-10-CM

## 2012-09-02 DIAGNOSIS — D696 Thrombocytopenia, unspecified: Secondary | ICD-10-CM | POA: Diagnosis present

## 2012-09-02 DIAGNOSIS — R7989 Other specified abnormal findings of blood chemistry: Secondary | ICD-10-CM

## 2012-09-02 DIAGNOSIS — R5081 Fever presenting with conditions classified elsewhere: Secondary | ICD-10-CM

## 2012-09-02 DIAGNOSIS — R55 Syncope and collapse: Secondary | ICD-10-CM | POA: Diagnosis present

## 2012-09-02 DIAGNOSIS — E785 Hyperlipidemia, unspecified: Secondary | ICD-10-CM | POA: Diagnosis present

## 2012-09-02 DIAGNOSIS — K5289 Other specified noninfective gastroenteritis and colitis: Principal | ICD-10-CM

## 2012-09-02 DIAGNOSIS — D6959 Other secondary thrombocytopenia: Secondary | ICD-10-CM

## 2012-09-02 LAB — GLUCOSE, CAPILLARY: Glucose-Capillary: 115 mg/dL — ABNORMAL HIGH (ref 70–99)

## 2012-09-02 LAB — CBC WITH DIFFERENTIAL/PLATELET
Basophils Absolute: 0 10*3/uL (ref 0.0–0.1)
Basophils Relative: 0 % (ref 0–1)
Eosinophils Relative: 1 % (ref 0–5)
Hemoglobin: 14.1 g/dL (ref 12.0–15.0)
Lymphs Abs: 2.1 10*3/uL (ref 0.7–4.0)
Lymphs Abs: 2.3 10*3/uL (ref 0.7–4.0)
MCH: 30.8 pg (ref 26.0–34.0)
MCH: 30.2 pg (ref 26.0–34.0)
MCV: 86.7 fL (ref 78.0–100.0)
MCV: 86.7 fL (ref 78.0–100.0)
Monocytes Absolute: 0.4 10*3/uL (ref 0.1–1.0)
Monocytes Absolute: 0.5 10*3/uL (ref 0.1–1.0)
Monocytes Relative: 13 % — ABNORMAL HIGH (ref 3–12)
Platelets: 87 10*3/uL — ABNORMAL LOW (ref 150–400)
RBC: 4.58 MIL/uL (ref 3.87–5.11)
RDW: 12.9 % (ref 11.5–15.5)
WBC: 2.7 10*3/uL — ABNORMAL LOW (ref 4.0–10.5)
WBC: 3 10*3/uL — ABNORMAL LOW (ref 4.0–10.5)

## 2012-09-02 LAB — COMPREHENSIVE METABOLIC PANEL
ALT: 20 U/L (ref 0–35)
AST: 22 U/L (ref 0–37)
Albumin: 2.9 g/dL — ABNORMAL LOW (ref 3.5–5.2)
Alkaline Phosphatase: 56 U/L (ref 39–117)
Alkaline Phosphatase: 51 U/L (ref 39–117)
BUN: 13 mg/dL (ref 6–23)
CO2: 21 mEq/L (ref 19–32)
CO2: 21 mEq/L (ref 19–32)
Calcium: 8.4 mg/dL (ref 8.4–10.5)
Chloride: 100 mEq/L (ref 96–112)
Creatinine, Ser: 0.8 mg/dL (ref 0.50–1.10)
GFR calc Af Amer: 81 mL/min — ABNORMAL LOW (ref >90)
GFR calc non Af Amer: 72 mL/min — ABNORMAL LOW (ref >90)
GFR calc non Af Amer: 70 mL/min — ABNORMAL LOW (ref >90)
Glucose, Bld: 132 mg/dL — ABNORMAL HIGH (ref 70–99)
Potassium: 3.6 mEq/L (ref 3.5–5.1)
Sodium: 132 mEq/L — ABNORMAL LOW (ref 135–145)
Total Bilirubin: 1.3 mg/dL — ABNORMAL HIGH (ref 0.3–1.2)

## 2012-09-02 LAB — URINALYSIS, ROUTINE W REFLEX MICROSCOPIC
Bilirubin Urine: NEGATIVE
Glucose, UA: NEGATIVE mg/dL
Hgb urine dipstick: NEGATIVE
Ketones, ur: NEGATIVE mg/dL
Leukocytes, UA: NEGATIVE
Protein, ur: NEGATIVE mg/dL
pH: 6 (ref 5.0–8.0)

## 2012-09-02 LAB — LIPASE, BLOOD: Lipase: 41 U/L (ref 11–59)

## 2012-09-02 LAB — PRO B NATRIURETIC PEPTIDE: Pro B Natriuretic peptide (BNP): 1049 pg/mL — ABNORMAL HIGH (ref 0–125)

## 2012-09-02 LAB — TROPONIN I
Troponin I: 0.73 ng/mL (ref <0.30)
Troponin I: 0.93 ng/mL (ref <0.30)
Troponin I: 0.82 ng/mL (ref <0.30)

## 2012-09-02 MED ORDER — DEXAMETHASONE 4 MG PO TABS
8.0000 mg | ORAL_TABLET | Freq: Two times a day (BID) | ORAL | Status: DC
  Filled 2012-09-02 (×3): qty 2

## 2012-09-02 MED ORDER — SODIUM CHLORIDE 0.9 % IJ SOLN
10.0000 mL | INTRAMUSCULAR | Status: DC | PRN
  Administered 2012-09-02 – 2012-09-04 (×2): 10 mL

## 2012-09-02 MED ORDER — ASPIRIN 81 MG PO CHEW
324.0000 mg | CHEWABLE_TABLET | Freq: Once | ORAL | Status: AC
  Administered 2012-09-02: 324 mg via ORAL
  Filled 2012-09-02: qty 4

## 2012-09-02 MED ORDER — ONDANSETRON HCL 4 MG/2ML IJ SOLN
4.0000 mg | Freq: Four times a day (QID) | INTRAMUSCULAR | Status: DC | PRN
  Administered 2012-09-04 (×2): 4 mg via INTRAVENOUS
  Filled 2012-09-02 (×2): qty 2

## 2012-09-02 MED ORDER — VANCOMYCIN HCL IN DEXTROSE 750-5 MG/150ML-% IV SOLN
750.0000 mg | Freq: Two times a day (BID) | INTRAVENOUS | Status: DC
  Administered 2012-09-02 – 2012-09-04 (×5): 750 mg via INTRAVENOUS
  Filled 2012-09-02 (×6): qty 150

## 2012-09-02 MED ORDER — LORATADINE 10 MG PO TABS
10.0000 mg | ORAL_TABLET | Freq: Every morning | ORAL | Status: DC
  Administered 2012-09-02 – 2012-09-04 (×3): 10 mg via ORAL
  Filled 2012-09-02 (×3): qty 1

## 2012-09-02 MED ORDER — IOHEXOL 300 MG/ML  SOLN
50.0000 mL | Freq: Once | INTRAMUSCULAR | Status: AC | PRN
  Administered 2012-09-02: 50 mL via ORAL

## 2012-09-02 MED ORDER — IOHEXOL 300 MG/ML  SOLN
100.0000 mL | Freq: Once | INTRAMUSCULAR | Status: AC | PRN
  Administered 2012-09-02: 100 mL via INTRAVENOUS

## 2012-09-02 MED ORDER — ACETAMINOPHEN 650 MG RE SUPP: 650.0000 mg | Freq: Four times a day (QID) | RECTAL | Status: DC | PRN

## 2012-09-02 MED ORDER — VITAMIN D3 25 MCG (1000 UNIT) PO TABS: 5000.0000 [IU] | ORAL_TABLET | ORAL | Status: DC

## 2012-09-02 MED ORDER — PANTOPRAZOLE SODIUM 40 MG PO TBEC
40.0000 mg | DELAYED_RELEASE_TABLET | Freq: Every morning | ORAL | Status: DC
  Administered 2012-09-02 – 2012-09-04 (×3): 40 mg via ORAL
  Filled 2012-09-02 (×3): qty 1

## 2012-09-02 MED ORDER — ONDANSETRON HCL 4 MG PO TABS: 8.0000 mg | ORAL_TABLET | Freq: Two times a day (BID) | ORAL | Status: DC | PRN

## 2012-09-02 MED ORDER — LORAZEPAM 0.5 MG PO TABS: 0.5000 mg | ORAL_TABLET | Freq: Four times a day (QID) | ORAL | Status: DC | PRN

## 2012-09-02 MED ORDER — SODIUM CHLORIDE 0.9 % IJ SOLN
10.0000 mL | Freq: Two times a day (BID) | INTRAMUSCULAR | Status: DC
  Administered 2012-09-03: 10 mL

## 2012-09-02 MED ORDER — SODIUM CHLORIDE 0.9 % IJ SOLN
3.0000 mL | Freq: Two times a day (BID) | INTRAMUSCULAR | Status: DC
  Administered 2012-09-03: 3 mL via INTRAVENOUS

## 2012-09-02 MED ORDER — METRONIDAZOLE 500 MG PO TABS
500.0000 mg | ORAL_TABLET | Freq: Three times a day (TID) | ORAL | Status: DC
  Administered 2012-09-02 – 2012-09-04 (×6): 500 mg via ORAL
  Filled 2012-09-02 (×9): qty 1

## 2012-09-02 MED ORDER — METRONIDAZOLE IN NACL 5-0.79 MG/ML-% IV SOLN
500.0000 mg | Freq: Three times a day (TID) | INTRAVENOUS | Status: DC
  Administered 2012-09-02: 500 mg via INTRAVENOUS
  Filled 2012-09-02 (×3): qty 100

## 2012-09-02 MED ORDER — ONDANSETRON HCL 4 MG PO TABS
4.0000 mg | ORAL_TABLET | Freq: Four times a day (QID) | ORAL | Status: DC | PRN
  Administered 2012-09-03: 4 mg via ORAL
  Filled 2012-09-02: qty 1

## 2012-09-02 MED ORDER — LEVOTHYROXINE SODIUM 75 MCG PO TABS
75.0000 ug | ORAL_TABLET | Freq: Every morning | ORAL | Status: DC
  Administered 2012-09-02 – 2012-09-04 (×3): 75 ug via ORAL
  Filled 2012-09-02 (×4): qty 1

## 2012-09-02 MED ORDER — GABAPENTIN 100 MG PO CAPS
100.0000 mg | ORAL_CAPSULE | Freq: Every day | ORAL | Status: DC | PRN
  Administered 2012-09-02: 100 mg via ORAL
  Filled 2012-09-02: qty 3

## 2012-09-02 MED ORDER — MORPHINE SULFATE 2 MG/ML IJ SOLN
1.0000 mg | INTRAMUSCULAR | Status: DC | PRN
  Administered 2012-09-02 – 2012-09-03 (×2): 1 mg via INTRAVENOUS
  Filled 2012-09-02 (×2): qty 1

## 2012-09-02 MED ORDER — B COMPLEX-C PO TABS
1.0000 | ORAL_TABLET | Freq: Every day | ORAL | Status: DC
  Administered 2012-09-02 – 2012-09-04 (×3): 1 via ORAL
  Filled 2012-09-02 (×3): qty 1

## 2012-09-02 MED ORDER — DEXTROSE 5 % IV SOLN
2.0000 g | Freq: Three times a day (TID) | INTRAVENOUS | Status: DC
  Administered 2012-09-02 – 2012-09-04 (×7): 2 g via INTRAVENOUS
  Filled 2012-09-02 (×9): qty 2

## 2012-09-02 MED ORDER — ACETAMINOPHEN 325 MG PO TABS: 650.0000 mg | ORAL_TABLET | Freq: Four times a day (QID) | ORAL | Status: DC | PRN

## 2012-09-02 MED ORDER — B COMPLEX PO TABS: 1.0000 | ORAL_TABLET | Freq: Every morning | ORAL | Status: DC

## 2012-09-02 MED ORDER — ATORVASTATIN CALCIUM 20 MG PO TABS
20.0000 mg | ORAL_TABLET | Freq: Every day | ORAL | Status: DC
  Administered 2012-09-02 – 2012-09-03 (×2): 20 mg via ORAL
  Filled 2012-09-02 (×3): qty 1

## 2012-09-02 MED ORDER — HYDROCODONE-ACETAMINOPHEN 5-325 MG PO TABS
1.0000 | ORAL_TABLET | Freq: Four times a day (QID) | ORAL | Status: DC | PRN
  Administered 2012-09-02 – 2012-09-03 (×2): 1 via ORAL
  Filled 2012-09-02 (×2): qty 1

## 2012-09-02 MED ORDER — SODIUM CHLORIDE 0.9 % IV SOLN
INTRAVENOUS | Status: AC
  Administered 2012-09-02: 05:00:00 via INTRAVENOUS

## 2012-09-02 NOTE — Progress Notes (Signed)
[facility] PROGRESS NOTE  Shelia Obrien WJX:[id number] DOB: 1940/07/04 DOA: 09/01/2012 PCP: [name], MD Primary Oncologist: Dr. Drue Second Primary Surgeon: Dr. Almond Lint   Brief narrative 72 year old female patient with history of breast cancer, recently placed right Port-A-Cath (was on antibiotics at that time), recently started chemotherapy, hypothyroidism, dyslipidemia, returned home after office visit with the surgeon on Monday. She was a morning, patient woke up feeling generally weak and dizzy but without any other complaints. She stayed in bed all day with only minimal ambulating to the bathroom and kitchen. That night she took MiraLAX for constipation and had 4 diarrheal stools. She denies nausea, vomiting, abdominal pain, fever, chills, ear ache, tinnitus, chest pain, dyspnea, cough. Next day she continued to feel weak and dizzy-only when she stood up. She then passed out x2. No history of long distance travel. grade fevers but no cough. No reports of blood in stool. NO GU symptoms.  In ED, BP was 108/69, HR 111 and Tmax 99.9 F. Her CXR was negative for acute cardiopulmonary findings. CT abdomen was significant for possible diffuse colitis, C.diff colitis. Her CBC was significant for WBC count of 2.7 and platelet count of 98. Troponin was elevated at 0.77 and cardiology was consulted for input on management. Hospitalist admission was requested.  Assessment/Plan: 1. Syncope:? Orthostatic hypotension from dehydration versus vasovagal. Less likely due to ACS. Check orthostatic blood pressures, followup 2-D echo and carotid Dopplers. Patient feels much better. She has ambulated to the bathroom without any dizziness or lightheadedness. 2. Diffuse colitis: Unclear etiology. C. difficile PCR negative. Continue empiric IV vancomycin, cefepime and Flagyl secondary to febrile neutropenia. 3. Leukopenia/neutropenia and thrombocytopenia: Secondary to recent chemotherapy. No bleeding.  Status post Neulasta on 5/17-no Neupogen at this time. Follow daily CBCs. Oncology input appreciated. 4. Elevated troponin: Cardiology input appreciated-indicate that this is less likely to be ACS but rule out chemotherapy related cardiotoxicity. Followup 2-D echo. 5. Breast cancer: Management per oncology. 6. Hypothyroidism 7. Dyslipidemia  Code Status: Full Family Communication: Discussed with patient's son Ree Kida at bedside. Disposition Plan: Remains inpatient.   Consultants:  Cardiology  Oncology  Procedures:  None  Antibiotics:  IV cefepime 5/21 >  IV vancomycin 5/21 >   HPI/Subjective: Feels much better. Denies chest pain, dyspnea or abdominal pain. No further diarrhea. No dizziness or lightheadedness on ambulating.  Objective: Filed Vitals:   09/02/12 0104 09/02/12 0440 09/02/12 0525 09/02/12 0530  BP: 127/67 120/69 121/54   Pulse:  97 101   Temp: 99.5 F (37.5 C)  98.4 F (36.9 C)   TempSrc: Oral  Oral   Resp: 13 20 16    Height:    5\' 3"  (1.6 m)  Weight:    68.312 kg (150 lb 9.6 oz)  SpO2: 100% 100%     No intake or output data in the 24 hours ending 09/02/12 0739 Filed Weights   09/02/12 0530  Weight: 68.312 kg (150 lb 9.6 oz)    Exam:   General exam: Comfortable.  Respiratory system: Clear. No increased work of breathing.  Cardiovascular system: S1 & S2 heard, RRR. No JVD, murmurs, gallops, clicks or pedal edema. Telemetry: Sinus rhythm.  Gastrointestinal system: Abdomen is nondistended, soft and mildly tender in the lower quadrants but without rigidity, guarding or rebound. Normal bowel sounds heard.  Central nervous system: Alert and oriented. No focal neurological deficits.  Extremities: Symmetric 5 x 5 power.   Data Reviewed: Basic Metabolic Panel:  Recent Labs Lab 08/27/12 0813 09/02/12  0145  NA 141 134*  K 3.9 3.6  CL 107 100  CO2 25 21  GLUCOSE 103* 117*  BUN 15.4 15  CREATININE 0.8 0.80  CALCIUM 9.4 8.5   Liver Function  Tests:  Recent Labs Lab 08/27/12 0813 09/02/12 0145  AST 24 22  ALT 25 20  ALKPHOS 47 56  BILITOT 0.66 1.3*  PROT 7.0 5.9*  ALBUMIN 3.9 2.9*    Recent Labs Lab 09/02/12 0145  LIPASE 41   No results found for this basename: AMMONIA,  in the last 168 hours CBC:  Recent Labs Lab 08/27/12 0813 09/02/12 0145 09/02/12 0650  WBC 18.3* 2.7* 3.0*  NEUTROABS 14.5* 0.2* PENDING  HGB 14.3 14.1 13.0  HCT 41.6 39.7 37.3  MCV 90.4 86.7 86.7  PLT 200 98* 87*   Cardiac Enzymes:  Recent Labs Lab 09/02/12 0145 09/02/12 0430  TROPONINI 0.77* 0.73*   BNP (last 3 results) No results found for this basename: PROBNP,  in the last 8760 hours CBG:  Recent Labs Lab 09/02/12 0728  GLUCAP 115*    No results found for this or any previous visit (from the past 240 hour(s)).   Studies: Dg Chest 2 View  09/02/2012   *RADIOLOGY REPORT*  Clinical Data: Fever.  Breast cancer.  CHEST - 2 VIEW  Comparison: 08/25/2012.  Findings: The Port-A-Cath is stable.  The cardiac silhouette, mediastinal and hilar contours are unchanged.  The lungs are clear. No pleural effusion. Surgical changes involving the right breast and is are noted.  The bony thorax is intact.  IMPRESSION: No acute cardiopulmonary findings.   Original Report Authenticated By: Rudie Meyer, M.D.   Ct Abdomen Pelvis W Contrast  09/02/2012   *RADIOLOGY REPORT*  Clinical Data: Abdominal pain and diarrhea.  CT ABDOMEN AND PELVIS WITH CONTRAST  Technique:  Multidetector CT imaging of the abdomen and pelvis was performed following the standard protocol during bolus administration of intravenous contrast.  Contrast: 50mL OMNIPAQUE IOHEXOL 300 MG/ML  SOLN, OMNIPAQUE IOHEXOL 300 MG/ML  SOLN  Comparison: 04/18/2005.  Findings: The lung bases are clear.  No pleural effusion.  No pulmonary nodules.  The solid abdominal organs are unremarkable.  The gallbladder is surgically absent.  No common bile duct dilatation.  A moderate sized duodenal  diverticulum is noted.  A small hiatal hernia is noted.  The stomach, duodenum and small bowel are grossly normal.  There is moderate inflammation of the colon suspicious for C difficile colitis.  Other inflammatory or infectious colitis is possible.  The aorta is normal.  The branch vessels are patent.  No mesenteric or retroperitoneal mass or adenopathy.  Small lymph nodes are noted.  The portal and splenic veins are patent.  The renal veins are patent.  The uterus is surgically absent.  No pelvic mass or adenopathy.  No free pelvic fluid collections.  The bladder is normal.  The bony structures are unremarkable.  IMPRESSION:  1.  Diffuse colitis most likely C difficile colitis. 2.  No findings for metastatic disease involving the abdomen/pelvis.   Original Report Authenticated By: Rudie Meyer, M.D.     Additional labs:   Scheduled Meds: . atorvastatin  20 mg Oral q1800  . B-complex with vitamin C  1 tablet Oral Daily  . ceFEPime (MAXIPIME) IV  2 g Intravenous Q8H  . [START ON 09/08/2012] cholecalciferol  5,000 Units Oral Weekly  . dexamethasone  8 mg Oral BID WC  . levothyroxine  75 mcg Oral q morning -  10a  . loratadine  10 mg Oral q morning - 10a  . metronidazole  500 mg Intravenous Q8H  . pantoprazole  40 mg Oral q morning - 10a  . sodium chloride  10-40 mL Intracatheter Q12H  . sodium chloride  3 mL Intravenous Q12H  . vancomycin  750 mg Intravenous Q12H   Continuous Infusions: . sodium chloride 75 mL/hr at 09/02/12 0455    Principal Problem:   Syncope Active Problems:   Cancer of lower-inner quadrant of female breast   Elevated troponin   Thrombocytopenia   Neutropenia, febrile   Hypothyroidism   Dyslipidemia    Time spent: 45 minutes.    [facility]  [facility] Pager [phone].   If 8PM-8AM, please contact night-coverage at [email or web address], password [email or web address] 09/02/2012, 7:39 AM  LOS: 1 day

## 2012-09-02 NOTE — Evaluation (Signed)
Physical Therapy One Time Evaluation Patient Details Name: Shelia Obrien MRN: [id number] DOB: [birth year] Today's Date: 09/02/2012 Time: 1478-2956 PT Time Calculation (min): 12 min  PT Assessment / Plan / Recommendation Clinical Impression  Pt is a 72 year old female admitted for syncope, leukopenia/neutropenia and thrombocytopenia.  Pt recently started chemotherapy for breast cancer.  Discussed increased possibility for weakness with treatments and may need to use assistive device for safety at home if this occurs. RN aware of therapy eval and did not receive call from telemetry monitors during ambulation (pulse ox not reading).  HR was 112 bpm upon checking monitor after leaving room.  Pt appears close to baseline in regards to mobility and states she does not need PT at this time. Pt agreeable to ambulate with family or staff during hospitalization.      PT Assessment  Patent does not need any further PT services    Follow Up Recommendations  No PT follow up    Does the patient have the potential to tolerate intense rehabilitation      Barriers to Discharge        Equipment Recommendations  None recommended by PT    Recommendations for Other Services     Frequency      Precautions / Restrictions Precautions Precautions: Fall   Pertinent Vitals/Pain No dizziness reported during evaluation.      Mobility  Bed Mobility Bed Mobility: Supine to Sit;Sit to Supine Supine to Sit: 7: Independent Sit to Supine: 7: Independent Transfers Transfers: Sit to Stand;Stand to Sit Sit to Stand: 6: Modified independent (Device/Increase time);From bed Stand to Sit: 6: Modified independent (Device/Increase time);To bed Ambulation/Gait Ambulation/Gait Assistance: 5: Supervision;4: Min guard Ambulation Distance (Feet): 280 Feet Assistive device: Other (Comment) Ambulation/Gait Assistance Details: pt grazes along hand rail for support (states uses furniture or wall at home), pt reports gait  is close to baseline, reports feeling a little weaker since she hasn't ambulated in hallway since admission Gait Pattern: Step-through pattern Gait velocity: decreased    Exercises     PT Diagnosis:    PT Problem List:   PT Treatment Interventions:     PT Goals    Visit Information  Last PT Received On: 09/02/12 Assistance Needed: +1    Subjective Data  Subjective: Now when they ask if you've fallen I have to say yes.  (syncope at home)   Prior Functioning  Home Living Lives With: Alone Available Help at Discharge: Family;Other (Comment) (states daughter or other family will stay with her upon d/c) Type of Home: House Home Access: Level entry Home Layout: One level Home Adaptive Equipment: Walker - rolling;Straight cane Prior Function Level of Independence: Independent Comments: however states she holds onto wall or furniture at home Communication Communication: No difficulties    Cognition  Cognition Arousal/Alertness: Awake/alert Behavior During Therapy: WFL for tasks assessed/performed Overall Cognitive Status: Within Functional Limits for tasks assessed    Extremity/Trunk Assessment Right Upper Extremity Assessment RUE ROM/Strength/Tone: [facility] for tasks assessed Left Upper Extremity Assessment LUE ROM/Strength/Tone: WFL for tasks assessed Right Lower Extremity Assessment RLE ROM/Strength/Tone: [facility] for tasks assessed Left Lower Extremity Assessment LLE ROM/Strength/Tone: [facility] for tasks assessed   Balance    End of Session PT - End of Session Activity Tolerance: Patient tolerated treatment well Patient left: in bed Nurse Communication: Mobility status  GP     Nicollette Wilhelmi,KATHrine E 09/02/2012, 2:54 PM Zenovia Jarred, PT, DPT 09/02/2012 Pager: [phone]

## 2012-09-02 NOTE — Progress Notes (Signed)
ANTIBIOTIC CONSULT NOTE - INITIAL  Pharmacy Consult for Vancomycin and Cefepime Indication: Febrile neutropenia  No Known Allergies  Patient Measurements:   Wt=70.5 kg  Vital Signs: Temp: 99.5 F (37.5 C) (05/22 0104) Temp src: Oral (05/22 0104) BP: 127/67 mmHg (05/22 0104) Pulse Rate: 111 (05/21 2330) Intake/Output from previous day:   Intake/Output from this shift:    Labs:  Recent Labs  09/02/12 0145  WBC 2.7*  HGB 14.1  PLT 98*  CREATININE 0.80   The CrCl is unknown because both a height and weight (above a minimum accepted value) are required for this calculation. No results found for this basename: VANCOTROUGH, VANCOPEAK, VANCORANDOM, GENTTROUGH, GENTPEAK, GENTRANDOM, TOBRATROUGH, TOBRAPEAK, TOBRARND, AMIKACINPEAK, AMIKACINTROU, AMIKACIN,  in the last 72 hours   Microbiology: No results found for this or any previous visit (from the past 720 hour(s)).  Medical History: Past Medical History  Diagnosis Date  . Bronchitis   . Thyroid disease   . Acid reflux disease   . High cholesterol   . Breast cancer   . Seasonal allergies   . Hypothyroidism   . Leaking of urine     Dribbles if coughs. Pt wears pad  . Epistaxis     went to [facility] ER  . Shortness of breath   . PONV (postoperative nausea and vomiting)   . Wears glasses   . Arthritis     Medications:  Scheduled:  . aspirin  324 mg Oral Once  . vancomycin  750 mg Intravenous Q12H   Infusions:  . ceFEPime (MAXIPIME) IV    . metronidazole     Assessment: 72 yo with hx of invasive stage I right breast Ca, s/p syncopal episode x2.  Last chemo 5/19.  Cefepime and Vancomycin for febrile neutropenia.  Goal of Therapy:  Vancomycin trough level 15-20 mcg/ml  Plan:   Cefepime 2Gm IV q8h.  Vancomycin 750mg  IV q12h. CrCl~60 (N)  F/u Scr/levels/cultures as needed  Lorenza Evangelist 09/02/2012,4:21 AM

## 2012-09-02 NOTE — Progress Notes (Signed)
INPATIENT  PROGRESS NOTE  CC  Shelia Kayser, MD [address] [address] Dr. Almond Lint  Dr. 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   STAGE:  Cancer of lower-inner quadrant of female breast  Primary site: Breast (Right)  Staging method: AJCC 7th Edition  Clinical: Stage IA (T1c, N0, cM0)  Summary: Stage IA (T1c, N0, cM0)   PRIOR THERAPY:  #1 patient originally presented at the [facility] with a mammogram that showed architectural distortion. Ultrasound showed a 1.2 cm mass in the 4:00 position of the right breast. MRI confirmed a solitary lesion measuring 1.6 cm up. 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  #2 patient is status post lumpectomy with sentinel lymph node biopsy the final pathology revealed a 1.7 cm invasive lobular carcinoma. Tumor was ER positive PR positive HER-2/neu negative with a Ki-67 of 12% 4 sentinel nodes were negative for metastatic disease.  #3 patient had Oncotype DX testing performed her recurrence score was 29 giving her a 19% risk of distant recurrence with adjuvant hormonal therapy only. Oncotype DX testing was discussed with her. Patient is  in the intermediate risk category.  Taxotere and Cytoxan every 3 weeks for 4 cycles were recommended.  Port-A-Cath was placed on 08/27/2012.  CURRENT THERAPY: adjuvant chemotherapy consisting of cycle 1 Taxotere Cytoxan q. 21 days  08/30/2012. Neulasta on Day 2.   next scheduled chemo (C2 D1) for 09/17/2012 with neulasta on D2, 10/18/12   Patient was last seen in office by Dr. Welton Flakes on 08/27/12 prior to C1D1 with Taxotere and Cytoxan. She was clinically stable.    HOSPITAL COURSE: She was admitted on 5/21 with diarrhea, abdominal pain, complicated with syncopal episode and possible seizure activity. Labs demonstrated elevated  troponins requiring Cardiology evaluation. It was felt that this elevation was secondary to demand ischemia due to tachicardia and possible infection. No heparin was initiated for no other EKG signs were noted.Of note, 2 D echo was ordered.   CT of the abdomen and pelvis revealed extensive colitis. C diff cultures and blood cultures obtained. CXR showed NAD. She was placed in Vanco , Cefepime and Flagyl per pharmacy.  Neutropenia was noted in CBC. Her admitting WBC 2.7 with ANC of 0.2 (in the setting of recent chemo. Patient received Neulasta on 5/17), and platelet count of 98k, now 87 k  (200k on 5/16 without acute bleed). H/H were normal at 14.1/39.7 respectively.  We were kindly informed of the patient's admission.    MEDICAL HISTORY: Past Medical History  Diagnosis Date  . Bronchitis   . Thyroid disease   . Acid reflux disease   . High cholesterol   . Breast cancer   . Seasonal allergies   . Hypothyroidism   . Leaking of urine     Dribbles if coughs. Pt wears pad  . Epistaxis     went to [facility] ER  . Shortness of breath   . PONV (postoperative nausea and vomiting)   . Wears glasses   . Arthritis     ALLERGIES:  has No Known Allergies.  MEDICATIONS:   . atorvastatin  20 mg Oral q1800  . B-complex with vitamin C  1 tablet Oral Daily  . ceFEPime (MAXIPIME) IV  2 g Intravenous Q8H  . [START ON 09/08/2012] cholecalciferol  5,000 Units Oral Weekly  .  dexamethasone  8 mg Oral BID WC  . levothyroxine  75 mcg Oral q morning - 10a  . loratadine  10 mg Oral q morning - 10a  . metronidazole  500 mg Intravenous Q8H  . pantoprazole  40 mg Oral q morning - 10a  . sodium chloride  10-40 mL Intracatheter Q12H  . sodium chloride  3 mL Intravenous Q12H  . vancomycin  750 mg Intravenous Q12H   acetaminophen, acetaminophen, gabapentin, HYDROcodone-acetaminophen, LORazepam, morphine injection, ondansetron (ZOFRAN) IV, ondansetron, ondansetron, sodium chloride      SURGICAL HISTORY:   Past Surgical History  Procedure Laterality Date  . Abdominal hysterectomy    . Cholecystectomy    . Upper gi endoscopy    . Colonoscopy    . Knee arthroscopy Left   . Nasal septum surgery    . Breast lumpectomy with needle localization and axillary sentinel lymph node bx Right 07/22/2012    Procedure: BREAST LUMPECTOMY WITH NEEDLE LOCALIZATION AND AXILLARY SENTINEL LYMPH NODE BX;  Surgeon: Almond Lint, MD;  Location: [facility] OR;  Service: General;  Laterality: Right;  . Portacath placement Left 08/25/2012    Procedure: INSERTION PORT-A-CATH;  Surgeon: Almond Lint, MD;  Location: [facility];  Service: General;  Laterality: Left;    REVIEW OF SYSTEMS:  Pertinent items are noted in HPI.     PHYSICAL EXAMINATION: Blood pressure 121/54, pulse 101, temperature 98.4 F (36.9 C), temperature source Oral, resp. rate 16, height 5\' 3"  (1.6 m), weight 150 lb 9.6 oz (68.312 kg), SpO2 100.00%. Body mass index is 26.68 kg/(m^2).     72 yo wf well-developed older his female in no acute distress HEENT exam EOMI PERRLA sclerae anicteric no conjunctival pallor oral mucosa without thrush.  lungs are clear to auscultation cardiovascular is regular rate rhythm  abdomen is soft nontender nondistended bowel sounds are present no HSM  extremities no edema  neuro patient's alert oriented otherwise nonfocal Breast  right lumpectomy site looks well-healed no skin changes no erythema no nodularity.   LABORATORY DATA:  Recent Labs Lab 08/27/12 0813 09/02/12 0145 09/02/12 0650  NA 141 134* 132*  K 3.9 3.6 3.4*  CL 107 100 99  CO2 25 21 21   GLUCOSE 103* 117* 132*  BUN 15.4 15 13   CREATININE 0.8 0.80 0.82  CALCIUM 9.4 8.5 8.4  MG  --   --  1.7  AST 24 22 25   ALT 25 20 20   ALKPHOS 47 56 51  BILITOT 0.66 1.3* 1.3*     Recent Labs Lab 08/27/12 0813 09/02/12 0145 09/02/12 0650  WBC 18.3* 2.7* 3.0*  HGB 14.3 14.1 13.0  HCT 41.6 39.7 37.3  PLT 200 98* 87*  MCV 90.4 86.7 86.7   MCH 31.1 30.8 30.2  MCHC 34.4 35.5 34.9  RDW 13.5 12.7 12.9  LYMPHSABS 2.4 2.1 2.3  MONOABS 1.4* 0.4 0.5  EOSABS 0.0 0.0 0.1  BASOSABS 0.0 0.0 0.0    ADDITIONAL INFORMATION: 1. A sample (block 1A) was sent to [facility] for Oncotype testing. The patient's recurrence score is 29. Those patients who had a recurrence score of 29 had an average rate of distant recurrence of 19%. (JBK:caf 08/06/12) Pecola Leisure MD Pathologist, Electronic Signature ( Signed 08/06/2012) 1. CHROMOGENIC IN-SITU HYBRIDIZATION Results: HER-2/NEU BY CISH - NO AMPLIFICATION OF HER-2 DETECTED. RESULT RATIO OF HER2: CEP 17 SIGNALS 0.94 AVERAGE HER2 COPY NUMBER PER CELL 2.20 REFERENCE RANGE NEGATIVE HER2/Chr17 Ratio <2.0 and Average HER2 copy number <4.0 EQUIVOCAL HER2/Chr17  Ratio <2.0 and Average HER2 copy number 4.0 and <6.0 POSITIVE HER2/Chr17 Ratio >=2.0 and/or Average HER2 copy number >=6.0I Jimmy Picket MD Pathologist, Electronic Signature ( Signed 07/30/2012) FINAL DIAGNOSIS Diagnosis 1. Breast, lumpectomy, right 1 of 4 FINAL for KEMBER, BOCH D [phone]) Diagnosis(continued) - INVASIVE LOBULAR CARCINOMA, GRADE II/III, SPANNING 1.7 CM. - LOBULAR CARCINOMA IN SITU. - PERINEURAL INVASION IS IDENTIFIED. - THE SURGICAL RESECTION MARGINS ARE NEGATIVE FOR CARCINOMA. - SEE ONCOLOGY TABLE BELOW. 2. Lymph node, sentinel, biopsy, #1 right axilla - THERE IS NO EVIDENCE OF CARCINOMA IN 1 OF 1 LYMPH NODE (0/1). 3. Lymph node, sentinel, biopsy, #2 right axilla - THERE IS NO EVIDENCE OF CARCINOMA IN 1 OF 1 LYMPH NODE (0/1). 4. Lymph node, sentinel, biopsy, #3, right axilla - THERE IS NO EVIDENCE OF CARCINOMA IN 1 OF 1 LYMPH NODE (0/1). 5. Lymph node, sentinel, biopsy, #4, right axilla - THERE IS NO EVIDENCE OF CARCINOMA IN 1 OF 1 LYMPH NODE (0/1). Microscopic Comment 1. BREAST, INVASIVE TUMOR, WITH LYMPH NODE SAMPLING Specimen, including laterality: Right breast. Procedure: Lumpectomy. Grade:  II. Tubule formation: 3. Nuclear pleomorphism: 2. Mitotic: 1. Tumor size (gross measurement): 1.7 cm. Margins: Invasive, distance to closest margin: 0.5 cm to the anterior and superior margins (gross measurements). Lymphovascular invasion: Not identified. Ductal carcinoma in situ: Not identified. Tumor focality: Unifocal. Treatment effect: N/A. Extent of tumor: Confined to breast parenchyma. Lymph nodes: # examined: 4. Lymph nodes with metastasis: 0. Breast prognostic profile: Case [id number]. Estrogen receptor: 93%, strong staining intensity. Progesterone receptor: 7%, strong staining intensity. HER-2/neu: No amplification was detected. The ratio was 1.04. HER-2/neu by CISH will be repeated on the current case and the results reported separately. Ki-67: 12%. Non-neoplastic breast: Healing biopsy site. TNM: pT1c, pN0. Comments: Immunohistochemical stains performed on parts 2-5 fail to highlight the presence of cytokeratin positive tumor cells. (JBK:eps 07/27/12)     ASSESSMENT/PLAN: 72 year old female with  #1 new diagnosis of invasive lobular carcinoma measuring 1.7 cm node negative ER positive PR positive HER-2/neu negative with Ki-67 low. Patient had Oncotype DX testing performed which showed an intermediate risk category recurrence score. S/p C1 D1 chemo with Taxotere and Cytoxan on 08/27/2012  With Neulasta D2.  Taxotere and Cytoxan to be given  every 21 days for a total of 4 cycles, last planned for 10/30/12.     #2: Neutropenia, multifactorial, s/p chemo on 5/16, infection. Patient received Neulasta on 5/17. No Neupogen at this time. Will closely monitor. Neutropenic precautions recommended. Patient on IV Vanco and Cefepime.   #3 trombocytopenia: multifactorial, no acute bleed. Likely related to recent chemo, dilution, polypharmacy and infection. Will monitor. No transfusion for now.   #4 Diarrhea, no further episodes since transferring to the unit.  r/o c diff colitis.  Cultures pending, continue flagyl.   #5 Elevated Troponinns, as per Cards, appreciate involvement. 2 D echo pending.   #6 Full Code  All other medical issues as per admitting team. Appreciate all the care provided to this nice patient. Dr. Welton Flakes to write addendum to this note. Thank you for informing us of the patient's admission.    Marlowe Kays, PAC/ Dr. Cherene Altes  ATTENDING'S ATTESTATION:  I personally reviewed patient's chart, examined patient myself, formulated the treatment plan as above.  Drue Second, MD Medical/Oncology [facility] [phone] (beeper) [phone] (Office)  09/03/2012, 4:41 PM

## 2012-09-02 NOTE — Care Management Note (Addendum)
    Page 1 of 1   09/04/2012     4:48:37 PM   CARE MANAGEMENT NOTE 09/04/2012  Patient:  Shelia Obrien, Shelia Obrien   Account Number:  [id number]  Date Initiated:  09/02/2012  Documentation initiated by:  Lanier Clam  Subjective/Objective Assessment:   ADMITTED W/SYNCOPE,NEUTROPENIC FEVER.UJ:WJXBJY CA.     Action/Plan:   FROM HOME.HAS PCP,PHARMACY.   Anticipated DC Date:  09/04/2012   Anticipated DC Plan:  HOME/SELF CARE      DC Planning Services  CM consult      Choice offered to / List presented to:             Status of service:  Completed, signed off Medicare Important Message given?   (If response is "NO", the following Medicare IM given date fields will be blank) Date Medicare IM given:   Date Additional Medicare IM given:    Discharge Disposition:  HOME/SELF CARE  Per UR Regulation:  Reviewed for med. necessity/level of care/duration of stay  If discussed at Long Length of Stay Meetings, dates discussed:    Comments:  09/04/12 Cleopatra Sardo RN,BSN NCM WEEKEND [phone] NO D/C NEEDS OR ORDERS.  09/02/12 Dyan Labarbera RN,BSN NCM [phone]

## 2012-09-02 NOTE — Progress Notes (Signed)
  Echocardiogram 2D Echocardiogram has been performed.  Shelia Obrien 09/02/2012, 2:21 PM

## 2012-09-02 NOTE — H&P (Signed)
[facility] History and Physical  Shelia Obrien:[id number] DOB: 1940/09/29 DOA: 09/01/2012  Referring physician: ER physician PCP: [name], MD   Chief Complaint: syncope  HPI:  72 year old female with history of invasive stage I right breast cancer (under Dr. Milta Deiters care) and recent chemotherapy 08/30/2012, hypothyroidism, dyslipidemia who presented to [facility] ED with 2 episodes of syncope started on the day  Of admission. Patient reported no associated chest pain, shortness of breath or palpitations. She did report having loose stools/diarrhe however she was taking stool softener at home. Patient reported associated low grade fevers but no cough. No reports of blood in stool. NO GU symptoms. In ED, BP was 108/69, HR 111 and Tmax 99.9 F. Her CXR was negative for acute cardiopulmonary findings. CT abdomen was significant for possible diffuse colitis, C.diff colitis. Her CBC was significant for WBC count of 2.7 and platelet count of 98. Troponin was elevated at 0.77 and cardiology was consulted for input on management. The recommendation was to start aspirin and cycle cardiac enzymes.  Assessment and Plan:  Principal Problem:   Syncope - possible ACS, NSTEMI; demand ischemia - appreciate cardiology following - cycle cardiac enzymes, check BNP and 2 D ECHO - per cardio heparin not required at this time Active Problems:   Elevated troponin - per cardio may use aspirin but heparin not needed at this time - cardio to follow in ma   Neutropenia, febrile - started empiric vanco and cefepime; added flagyl for possible C.diff; will taper antibiotics as blood work available to guide therapy - follow up blood culture results - follow up C diff results   Cancer of lower-inner quadrant of female breast - under Dr. Milta Deiters care, added as consultant   Thrombocytopenia - secondary to sequela of chemotherapy - use SCD for DVT prophylaxis - may use aspirin ( due to elevated troponin  level)   Hypothyroidism - continue levothyroxine   Dyslipidemia - continue rosuvastatin  Manson Passey [facility] [phone]  Review of Systems:  Constitutional: positive for fever, no chills and malaise/fatigue. Negative for diaphoresis.  HENT: Negative for hearing loss, ear pain, nosebleeds, congestion, sore throat, neck pain, tinnitus and ear discharge.   Eyes: Negative for blurred vision, double vision, photophobia, pain, discharge and redness.  Respiratory: Negative for cough, hemoptysis, sputum production, shortness of breath, wheezing and stridor.   Cardiovascular: Negative for chest pain, palpitations, orthopnea, claudication and leg swelling.  Gastrointestinal: positive for diarrhea, nausea Genitourinary: Negative for dysuria, urgency, frequency, hematuria and flank pain.  Musculoskeletal: Negative for myalgias, back pain, joint pain and falls.  Skin: Negative for itching and rash.  Neurological: positive for loss of consciousness and weakness. No tremors, no seizures. Endo/Heme/Allergies: Negative for environmental allergies and polydipsia. Does not bruise/bleed easily.  Psychiatric/Behavioral: Negative for suicidal ideas. The patient is not nervous/anxious.      Past Medical History  Diagnosis Date  . Bronchitis   . Thyroid disease   . Acid reflux disease   . High cholesterol   . Breast cancer   . Seasonal allergies   . Hypothyroidism   . Leaking of urine     Dribbles if coughs. Pt wears pad  . Epistaxis     went to [facility] ER  . Shortness of breath   . PONV (postoperative nausea and vomiting)   . Wears glasses   . Arthritis    Past Surgical History  Procedure Laterality Date  . Abdominal hysterectomy    . Cholecystectomy    .  Upper gi endoscopy    . Colonoscopy    . Knee arthroscopy Left   . Nasal septum surgery    . Breast lumpectomy with needle localization and axillary sentinel lymph node bx Right 07/22/2012    Procedure: BREAST LUMPECTOMY WITH NEEDLE  LOCALIZATION AND AXILLARY SENTINEL LYMPH NODE BX;  Surgeon: Almond Lint, MD;  Location: [facility] OR;  Service: General;  Laterality: Right;  . Portacath placement Left 08/25/2012    Procedure: INSERTION PORT-A-CATH;  Surgeon: Almond Lint, MD;  Location: [facility];  Service: General;  Laterality: Left;   Social History:  reports that she has never smoked. She does not have any smokeless tobacco history on file. She reports that she does not drink alcohol or use illicit drugs.  No Known Allergies  Family History:  Family History  Problem Relation Age of Onset  . Prostate cancer Father   . Breast cancer Maternal Aunt   . Lung cancer Maternal Uncle   . Lung cancer Maternal Grandmother   . Kidney cancer Maternal Grandmother      Prior to Admission medications   Medication Sig Start Date End Date Taking? Authorizing Provider  b complex vitamins tablet Take 1 tablet by mouth every morning.    Yes Historical Provider, MD  cholecalciferol (VITAMIN D) 1000 UNITS tablet Take 5,000 Units by mouth once a week. On Wednesdays   Yes Historical Provider, MD  dexamethasone (DECADRON) 4 MG tablet Take 2 tablets (8 mg total) by mouth 2 (two) times daily with a meal. Take two times a day the day before Taxotere. Then take two times a day starting the day after chemo for 3 days. 08/13/12  Yes Victorino December, MD  gabapentin (NEURONTIN) 100 MG capsule Take 100-300 mg by mouth daily as needed (for pain). Nerve pain   Yes Historical Provider, MD  HYDROcodone-acetaminophen (NORCO/VICODIN) 5-325 MG per tablet Take 1 tablet by mouth every 6 (six) hours as needed for pain.   Yes Historical Provider, MD  levothyroxine (SYNTHROID, LEVOTHROID) 75 MCG tablet Take 75 mcg by mouth every morning.    Yes Historical Provider, MD  loperamide (IMODIUM) 2 MG capsule Take 2 mg by mouth 4 (four) times daily as needed for diarrhea or loose stools.   Yes Historical Provider, MD  loratadine (CLARITIN) 10 MG tablet Take 10 mg  by mouth every morning.   Yes Historical Provider, MD  LORazepam (ATIVAN) 0.5 MG tablet Take 1 tablet (0.5 mg total) by mouth every 6 (six) hours as needed (Nausea or vomiting). 08/13/12  Yes Victorino December, MD  ondansetron (ZOFRAN) 8 MG tablet Take 8 mg by mouth every 12 (twelve) hours as needed for nausea. Take two times a day starting the day after chemo for 3 days. Then take two times a day as needed for nausea or vomiting. 08/13/12  Yes Victorino December, MD  pantoprazole (PROTONIX) 40 MG tablet Take 40 mg by mouth every morning.    Yes Historical Provider, MD  rosuvastatin (CRESTOR) 10 MG tablet Take 10 mg by mouth every evening.    Yes Historical Provider, MD   Physical Exam: Filed Vitals:   09/01/12 2330 09/02/12 0104  BP: 108/69 127/67  Pulse: 111   Temp: 99.9 F (37.7 C) 99.5 F (37.5 C)  TempSrc: Oral Oral  Resp: 14 13  SpO2: 97% 100%    Physical Exam  Constitutional: Appears well-developed and well-nourished. No distress.  HENT: Normocephalic. External right and left ear normal. Oropharynx is clear  and moist.  Eyes: Conjunctivae and EOM are normal. PERRLA, no scleral icterus.  Neck: Normal ROM. Neck supple. No JVD. No tracheal deviation. No thyromegaly.  CVS: RRR, S1/S2 +, no murmurs, no gallops, no carotid bruit.  Pulmonary: Effort and breath sounds normal, no stridor, rhonchi, wheezes, rales.  Abdominal: Soft. BS +,  no distension, tenderness, rebound or guarding.  Musculoskeletal: Normal range of motion. No edema and no tenderness.  Lymphadenopathy: No lymphadenopathy noted, cervical, inguinal. Neuro: Alert. Normal reflexes, muscle tone coordination. No cranial nerve deficit. Skin: Skin is warm and dry. No rash noted. Not diaphoretic. No erythema. No pallor.  Psychiatric: Normal mood and affect. Behavior, judgment, thought content normal.   Labs on Admission:  Basic Metabolic Panel:  Recent Labs Lab 08/27/12 0813 09/02/12 0145  NA 141 134*  K 3.9 3.6  CL 107 100   CO2 25 21  GLUCOSE 103* 117*  BUN 15.4 15  CREATININE 0.8 0.80  CALCIUM 9.4 8.5   Liver Function Tests:  Recent Labs Lab 08/27/12 0813 09/02/12 0145  AST 24 22  ALT 25 20  ALKPHOS 47 56  BILITOT 0.66 1.3*  PROT 7.0 5.9*  ALBUMIN 3.9 2.9*    Recent Labs Lab 09/02/12 0145  LIPASE 41   No results found for this basename: AMMONIA,  in the last 168 hours CBC:  Recent Labs Lab 08/27/12 0813 09/02/12 0145  WBC 18.3* 2.7*  NEUTROABS 14.5* 0.2*  HGB 14.3 14.1  HCT 41.6 39.7  MCV 90.4 86.7  PLT 200 98*   Cardiac Enzymes:  Recent Labs Lab 09/02/12 0145  TROPONINI 0.77*   BNP: No components found with this basename: POCBNP,  CBG: No results found for this basename: GLUCAP,  in the last 168 hours  Radiological Exams on Admission: Dg Chest 2 View 09/02/2012   * IMPRESSION: No acute cardiopulmonary findings.   Original Report Authenticated By: Rudie Meyer, M.D.   Ct Abdomen Pelvis W Contrast 09/02/2012   * IMPRESSION:  1.  Diffuse colitis most likely C difficile colitis. 2.  No findings for metastatic disease involving the abdomen/pelvis.   Original Report Authenticated By: Rudie Meyer, M.D.    EKG: Normal sinus rhythm, no ST/T wave changes  Code Status: Full Family Communication: Pt at bedside Disposition Plan: Admit for further evaluation  Manson Passey, MD  [facility] Pager [phone]  If 7PM-7AM, please contact night-coverage [email or web address] Password [email or web address] 09/02/2012, 4:00 AM

## 2012-09-02 NOTE — Progress Notes (Signed)
Pt negative for CDIFF; removed for enteric contact; family and patient notified; will continue to monitor patient

## 2012-09-02 NOTE — Consult Note (Signed)
CARDIOLOGY CONSULT NOTE   Shelia Obrien MRN: [id number] DOB/AGE: [birth year] 73 y.o. Admit date: 09/01/2012  Referring Physician:  Dr. Blinda Obrien Primary Cardiologist: unassigned   Reason for consultation:  Elevated troponin  HPI:  Pt is a 72 yo woman with breast cancer, recently started on chemotherapy, and HLD who presented with syncope, diarrhea, abd pain; found to elevated troponin, so cardiology was consulted.  CT abd shows extensive colitis, concerning for c diff.  Family witnessed her syncope today and felt that she had some sz like activity.  Pt denies any CP or SOB.  SHe reports that she had a port placed about a week ago and had some discomfort related to that.  She denies any LE edema, PND, orthopnea.  No other cardiac complaints.    Review of systems: A review of 10 organ systems was done and is negative except as stated above in HPI  Past Medical History  Diagnosis Date  . Bronchitis   . Thyroid disease   . Acid reflux disease   . High cholesterol   . Breast cancer   . Seasonal allergies   . Hypothyroidism   . Leaking of urine     Dribbles if coughs. Pt wears pad  . Epistaxis     went to [facility] ER  . Shortness of breath   . PONV (postoperative nausea and vomiting)   . Wears glasses   . Arthritis    Past Surgical History  Procedure Laterality Date  . Abdominal hysterectomy    . Cholecystectomy    . Upper gi endoscopy    . Colonoscopy    . Knee arthroscopy Left   . Nasal septum surgery    . Breast lumpectomy with needle localization and axillary sentinel lymph node bx Right 07/22/2012    Procedure: BREAST LUMPECTOMY WITH NEEDLE LOCALIZATION AND AXILLARY SENTINEL LYMPH NODE BX;  Surgeon: Shelia Lint, MD;  Location: [facility] OR;  Service: General;  Laterality: Right;  . Portacath placement Left 08/25/2012    Procedure: INSERTION PORT-A-CATH;  Surgeon: Shelia Lint, MD;  Location: [facility];  Service: General;  Laterality: Left;   History    Social History  . Marital Status: Widowed    Spouse Name: N/A    Number of Children: N/A  . Years of Education: N/A   Occupational History  . Not on file.   Social History Main Topics  . Smoking status: Never Smoker   . Smokeless tobacco: Not on file  . Alcohol Use: No  . Drug Use: No  . Sexually Active: Not Currently   Other Topics Concern  . Not on file   Social History Narrative  . No narrative on file    Family History  Problem Relation Age of Onset  . Prostate cancer Father   . Breast cancer Maternal Aunt   . Lung cancer Maternal Uncle   . Lung cancer Maternal Grandmother   . Kidney cancer Maternal Grandmother      No Known Allergies   (Not in a hospital admission)  Current facility-administered medications:aspirin chewable tablet 324 mg, 324 mg, Oral, Once, Shelia Crease, MD Current outpatient prescriptions:b complex vitamins tablet, Take 1 tablet by mouth every morning. , Disp: , Rfl: ;  cholecalciferol (VITAMIN D) 1000 UNITS tablet, Take 5,000 Units by mouth once a week. On Wednesdays, Disp: , Rfl:  dexamethasone (DECADRON) 4 MG tablet, Take 2 tablets (8 mg total) by mouth 2 (two) times daily with a meal. Take  two times a day the day before Taxotere. Then take two times a day starting the day after chemo for 3 days., Disp: 30 tablet, Rfl: 1;  gabapentin (NEURONTIN) 100 MG capsule, Take 100-300 mg by mouth daily as needed (for pain). Nerve pain, Disp: , Rfl:  HYDROcodone-acetaminophen (NORCO/VICODIN) 5-325 MG per tablet, Take 1 tablet by mouth every 6 (six) hours as needed for pain., Disp: , Rfl: ;  levothyroxine (SYNTHROID, LEVOTHROID) 75 MCG tablet, Take 75 mcg by mouth every morning. , Disp: , Rfl: ;  loperamide (IMODIUM) 2 MG capsule, Take 2 mg by mouth 4 (four) times daily as needed for diarrhea or loose stools., Disp: , Rfl:  loratadine (CLARITIN) 10 MG tablet, Take 10 mg by mouth every morning., Disp: , Rfl: ;  LORazepam (ATIVAN) 0.5 MG tablet, Take  1 tablet (0.5 mg total) by mouth every 6 (six) hours as needed (Nausea or vomiting)., Disp: 30 tablet, Rfl: 0 ondansetron (ZOFRAN) 8 MG tablet, Take 8 mg by mouth every 12 (twelve) hours as needed for nausea. Take two times a day starting the day after chemo for 3 days. Then take two times a day as needed for nausea or vomiting., Disp: , Rfl: ;  pantoprazole (PROTONIX) 40 MG tablet, Take 40 mg by mouth every morning. , Disp: , Rfl: ;  rosuvastatin (CRESTOR) 10 MG tablet, Take 10 mg by mouth every evening. , Disp: , Rfl:   Physical Exam: Blood pressure 127/67, pulse 111, temperature 99.5 F (37.5 C), temperature source Oral, resp. rate 13, SpO2 100.00%.; There is no weight on file to calculate BMI. Temp:  [99.5 F (37.5 C)-99.9 F (37.7 C)] 99.5 F (37.5 C) (05/22 0104) Pulse Rate:  [111] 111 (05/21 2330) Resp:  [13-14] 13 (05/22 0104) BP: (108-127)/(67-69) 127/67 mmHg (05/22 0104) SpO2:  [97 %-100 %] 100 % (05/22 0104)  No intake or output data in the 24 hours ending 09/02/12 0351 General: NAD Heent: dry MM Neck: No appreciable JVD  CV: tachy, RRR, no m Lungs: Clear to auscultation bilaterally ant with normal respiratory effort Extremities: No clubbing or cyanosis.  No pedal edema Skin: Intact without lesions or rashes  Neurologic: Alert and oriented x 3, grossly nonfocal  Psych: Normal mood and affect    Labs:  Recent Labs  09/02/12 0145  TROPONINI 0.77*   Lab Results  Component Value Date   WBC 2.7* 09/02/2012   HGB 14.1 09/02/2012   HCT 39.7 09/02/2012   MCV 86.7 09/02/2012   PLT 98* 09/02/2012    Recent Labs Lab 09/02/12 0145  NA 134*  K 3.6  CL 100  CO2 21  BUN 15  CREATININE 0.80  CALCIUM 8.5  PROT 5.9*  BILITOT 1.3*  ALKPHOS 56  ALT 20  AST 22  GLUCOSE 117*      EKG:  Sinus tach, no ischemia  Radiology:  Dg Chest 2 View  09/02/2012   *RADIOLOGY REPORT*  Clinical Data: Fever.  Breast cancer.  CHEST - 2 VIEW  Comparison: 08/25/2012.  Findings: The  Port-A-Cath is stable.  The cardiac silhouette, mediastinal and hilar contours are unchanged.  The lungs are clear. No pleural effusion. Surgical changes involving the right breast and is are noted.  The bony thorax is intact.  IMPRESSION: No acute cardiopulmonary findings.   Original Report Authenticated By: Rudie Meyer, M.D.   Ct Abdomen Pelvis W Contrast  09/02/2012   *RADIOLOGY REPORT*  Clinical Data: Abdominal pain and diarrhea.  CT ABDOMEN AND PELVIS WITH  CONTRAST  Technique:  Multidetector CT imaging of the abdomen and pelvis was performed following the standard protocol during bolus administration of intravenous contrast.  Contrast: 50mL OMNIPAQUE IOHEXOL 300 MG/ML  SOLN, OMNIPAQUE IOHEXOL 300 MG/ML  SOLN  Comparison: 04/18/2005.  Findings: The lung bases are clear.  No pleural effusion.  No pulmonary nodules.  The solid abdominal organs are unremarkable.  The gallbladder is surgically absent.  No common bile duct dilatation.  A moderate sized duodenal diverticulum is noted.  A small hiatal hernia is noted.  The stomach, duodenum and small bowel are grossly normal.  There is moderate inflammation of the colon suspicious for C difficile colitis.  Other inflammatory or infectious colitis is possible.  The aorta is normal.  The branch vessels are patent.  No mesenteric or retroperitoneal mass or adenopathy.  Small lymph nodes are noted.  The portal and splenic veins are patent.  The renal veins are patent.  The uterus is surgically absent.  No pelvic mass or adenopathy.  No free pelvic fluid collections.  The bladder is normal.  The bony structures are unremarkable.  IMPRESSION:  1.  Diffuse colitis most likely C difficile colitis. 2.  No findings for metastatic disease involving the abdomen/pelvis.   Original Report Authenticated By: Rudie Meyer, M.D.    ASSESSMENT:  Pt is a 72 yo woman with breast cancer, recently started on chemotherapy, and HLD who presented with syncope, diarrhea, abd pain;  found to elevated troponin, so cardiology was consulted.   PLAN:  Troponin elevation - likely 2/2 demand ischemia due to tachycardia and infection (likely c diff colitis).  There are no EKG changes or symptoms to suggest an ACS.  Therefore, would not start heparin for this indication.  Given tachycardia and active cancer, could consider further w/u for PE, though most likely explanation for tachycardia is dehydration from recent diarrhea.  Recommend echo in the morning, daily EKGs and continuing to trend troponins.  Work up of syncope per primary team - could be related to dehydration and generalized weakness?      Thank you for this consultation.  Recommendations relayed to Dr. Blinda Obrien.  Will continue to follow.  Signed: Hilary Hertz, MD Cardiology Fellow 09/02/2012, 3:51 AM

## 2012-09-02 NOTE — Progress Notes (Addendum)
Patient Name: Shelia Obrien Date of Encounter: 09/02/2012    SUBJECTIVE: The patient denies chest discomfort and dyspnea. Nausea and no other GI complaints or improving. No recurrence of dizziness or syncope.  TELEMETRY:  Sinus tachycardia: Filed Vitals:   09/02/12 0104 09/02/12 0440 09/02/12 0525 09/02/12 0530  BP: 127/67 120/69 121/54   Pulse:  97 101   Temp: 99.5 F (37.5 C)  98.4 F (36.9 C)   TempSrc: Oral  Oral   Resp: 13 20 16    Height:    5\' 3"  (1.6 m)  Weight:    68.312 kg (150 lb 9.6 oz)  SpO2: 100% 100%     No intake or output data in the 24 hours ending 09/02/12 1239  LABS: Basic Metabolic Panel:  Recent Labs  16/10/96 0145 09/02/12 0650  NA 134* 132*  K 3.6 3.4*  CL 100 99  CO2 21 21  GLUCOSE 117* 132*  BUN 15 13  CREATININE 0.80 0.82  CALCIUM 8.5 8.4  MG  --  1.7  PHOS  --  1.9*   BNP    Component Value Date/Time   PROBNP 1049.0* 09/02/2012 0650   CBC:  Recent Labs  09/02/12 0145 09/02/12 0650  WBC 2.7* 3.0*  NEUTROABS 0.2* 0.1*  HGB 14.1 13.0  HCT 39.7 37.3  MCV 86.7 86.7  PLT 98* 87*   Cardiac Enzymes:  Recent Labs  09/02/12 0430 09/02/12 0650 09/02/12 1030  TROPONINI 0.73* 0.92* 0.93*    Radiology/Studies:  No acute abnormality on x-ray. Port-A-Cath is noted  Physical Exam: Blood pressure 121/54, pulse 101, temperature 98.4 F (36.9 C), temperature source Oral, resp. rate 16, height 5\' 3"  (1.6 m), weight 68.312 kg (150 lb 9.6 oz), SpO2 100.00%. Weight change:    Tachycardia but no gallop around.  Neck veins are flat.  No peripheral edema.  Lungs are clear posteriorly bilaterally  ASSESSMENT:  1. Syncope of uncertain etiology. No arrhythmia identified so far. Likely vasovagal or volume contraction related.  2. Elevated cardiac markers including 3 troponin I and BNP levels. This may be a signal suggesting myocardial injury possibly the raising the question of acute cardiotoxicity from chemotherapy. This does not  sound like acute coronary syndrome/unstable angina.  3. Breast cancer with one course of chemotherapy using Taxotere and Cytoxan   Plan:   1.  Echocardiography to assess global left ventricular function and rule out acute chemotherapy related cardiotoxicity   Signed, Lesleigh Noe 09/02/2012, 12:39 PM

## 2012-09-02 NOTE — Progress Notes (Signed)
INITIAL NUTRITION ASSESSMENT  DOCUMENTATION CODES Per approved criteria  -Not Applicable   INTERVENTION: Provide Magic Cup BID Encourage PO intake  NUTRITION DIAGNOSIS: Inadequate oral intake related to poor appetite as evidenced by pt's reports of not eating some days and pt at 92% of her usual body weight.   Goal: Pt to meet >/= 90% of their estimated nutrition needs  Monitor:  PO intake Weight Labs  Reason for Assessment: Malnutrition Screening Tool, Score of 2  72 y.o. female  Admitting Dx: Syncope  ASSESSMENT: 72 year old female with history of invasive stage I right breast cancer (under Dr. Milta Deiters care) and recent chemotherapy 08/30/2012, hypothyroidism, dyslipidemia who presented to [facility] ED with 2 episodes of syncope.  Pt reports that her appetite has been poor since her chemo treatment on Friday; states she didn't eats anything at all on Tuesday or Wednesday and only ate about 25% of meals today. Pt reports that her usual body weights is 163 lbs. Pt complains of mouth sores and dry mouth and she has been using Biotene toothpaste at home which helps.   Height: Ht Readings from Last 1 Encounters:  09/02/12 5\' 3"  (1.6 m)    Weight: Wt Readings from Last 1 Encounters:  09/02/12 150 lb 9.6 oz (68.312 kg)    Ideal Body Weight: 115 lbs  % Ideal Body Weight: 130%  Wt Readings from Last 10 Encounters:  09/02/12 150 lb 9.6 oz (68.312 kg)  08/30/12 155 lb 6.4 oz (70.489 kg)  08/27/12 156 lb 4.8 oz (70.897 kg)  08/25/12 156 lb 9.6 oz (71.033 kg)  08/25/12 156 lb 9.6 oz (71.033 kg)  08/13/12 157 lb 12.8 oz (71.578 kg)  08/09/12 155 lb 12.8 oz (70.67 kg)  07/16/12 159 lb 6.3 oz (72.3 kg)  07/07/12 161 lb (73.029 kg)    Usual Body Weight: 163 lbs  % Usual Body Weight: 92%  BMI:  Body mass index is 26.68 kg/(m^2).  Estimated Nutritional Needs: Kcal: 1610-9604 Protein: 82-96 grams Fluid: 2-2. L  Skin: incision on left chest  Diet Order: General  EDUCATION  NEEDS: -No education needs identified at this time  No intake or output data in the 24 hours ending 09/02/12 1541  Last BM: 5/22   Labs:   Recent Labs Lab 08/27/12 0813 09/02/12 0145 09/02/12 0650  NA 141 134* 132*  K 3.9 3.6 3.4*  CL 107 100 99  CO2 25 21 21   BUN 15.4 15 13   CREATININE 0.8 0.80 0.82  CALCIUM 9.4 8.5 8.4  MG  --   --  1.7  PHOS  --   --  1.9*  GLUCOSE 103* 117* 132*    CBG (last 3)   Recent Labs  09/02/12 0728  GLUCAP 115*    Scheduled Meds: . atorvastatin  20 mg Oral q1800  . B-complex with vitamin C  1 tablet Oral Daily  . ceFEPime (MAXIPIME) IV  2 g Intravenous Q8H  . [START ON 09/08/2012] cholecalciferol  5,000 Units Oral Weekly  . levothyroxine  75 mcg Oral q morning - 10a  . loratadine  10 mg Oral q morning - 10a  . metroNIDAZOLE  500 mg Oral Q8H  . pantoprazole  40 mg Oral q morning - 10a  . sodium chloride  10-40 mL Intracatheter Q12H  . sodium chloride  3 mL Intravenous Q12H  . vancomycin  750 mg Intravenous Q12H    Continuous Infusions: . sodium chloride 75 mL/hr at 09/02/12 0455    Past  Medical History  Diagnosis Date  . Bronchitis   . Thyroid disease   . Acid reflux disease   . High cholesterol   . Breast cancer   . Seasonal allergies   . Hypothyroidism   . Leaking of urine     Dribbles if coughs. Pt wears pad  . Epistaxis     went to [facility] ER  . Shortness of breath   . PONV (postoperative nausea and vomiting)   . Wears glasses   . Arthritis     Past Surgical History  Procedure Laterality Date  . Abdominal hysterectomy    . Cholecystectomy    . Upper gi endoscopy    . Colonoscopy    . Knee arthroscopy Left   . Nasal septum surgery    . Breast lumpectomy with needle localization and axillary sentinel lymph node bx Right 07/22/2012    Procedure: BREAST LUMPECTOMY WITH NEEDLE LOCALIZATION AND AXILLARY SENTINEL LYMPH NODE BX;  Surgeon: Almond Lint, MD;  Location: [facility] OR;  Service: General;  Laterality:  Right;  . Portacath placement Left 08/25/2012    Procedure: INSERTION PORT-A-CATH;  Surgeon: Almond Lint, MD;  Location: [facility];  Service: General;  Laterality: Left;    Ian Malkin RD, LDN Inpatient Clinical Dietitian Pager: [phone] After Hours Pager: [phone]

## 2012-09-03 ENCOUNTER — Ambulatory Visit: Payer: Medicare Other | Admitting: Oncology

## 2012-09-03 ENCOUNTER — Other Ambulatory Visit: Payer: Medicare Other | Admitting: Lab

## 2012-09-03 DIAGNOSIS — R55 Syncope and collapse: Secondary | ICD-10-CM

## 2012-09-03 LAB — BASIC METABOLIC PANEL
BUN: 12 mg/dL (ref 6–23)
Creatinine, Ser: 0.91 mg/dL (ref 0.50–1.10)
GFR calc Af Amer: 71 mL/min — ABNORMAL LOW (ref >90)
Glucose, Bld: 105 mg/dL — ABNORMAL HIGH (ref 70–99)
Potassium: 4.1 mEq/L (ref 3.5–5.1)

## 2012-09-03 LAB — CBC WITH DIFFERENTIAL/PLATELET
Basophils Absolute: 0.1 10*3/uL (ref 0.0–0.1)
Eosinophils Relative: 1 % (ref 0–5)
HCT: 37.8 % (ref 36.0–46.0)
Lymphocytes Relative: 36 % (ref 12–46)
Lymphs Abs: 4.1 10*3/uL — ABNORMAL HIGH (ref 0.7–4.0)
MCV: 87.9 fL (ref 78.0–100.0)
Monocytes Relative: 28 % — ABNORMAL HIGH (ref 3–12)
Neutro Abs: 3.8 10*3/uL (ref 1.7–7.7)
Platelets: 140 10*3/uL — ABNORMAL LOW (ref 150–400)
RBC: 4.3 MIL/uL (ref 3.87–5.11)
RDW: 12.8 % (ref 11.5–15.5)
WBC: 11.2 10*3/uL — ABNORMAL HIGH (ref 4.0–10.5)

## 2012-09-03 MED ORDER — METOPROLOL SUCCINATE ER 25 MG PO TB24
25.0000 mg | ORAL_TABLET | Freq: Every day | ORAL | Status: DC
  Administered 2012-09-03 – 2012-09-04 (×2): 25 mg via ORAL
  Filled 2012-09-03 (×2): qty 1

## 2012-09-03 NOTE — Progress Notes (Addendum)
No cardiac complaints. The echocardiogram did not demonstrate evidence of systolic dysfunction or regional WMA. EF was > 60%. Diastolic dysfunction was noted.  The abnormal cardiac biomarkers are concerning and in the setting of chemotherapy may be evidence of those prone to LV deterioration with continued therapy. Alternatively, the markers could represent demand ischemia in the setting of underlying CAD.  Recommend beta-blocker therapy if BP allows and OP nuclear stress when strong enough. My office will call and arrange.Marland Kitchen

## 2012-09-03 NOTE — Progress Notes (Signed)
[facility] PROGRESS NOTE  Shelia Obrien ZOX:[id number] DOB: May 19, 1940 DOA: 09/01/2012 PCP: [name], MD  Assessment/Plan: Principal Problem:   Syncope Active Problems:   Cancer of lower-inner quadrant of female breast   Elevated troponin   Thrombocytopenia   Neutropenia, febrile   Hypothyroidism   Dyslipidemia   Colitis    Assessment/Plan:  1. Syncope:? Orthostatic hypotension from dehydration versus vasovagal. Less likely due to ACS. 2-D echo showed no evidence of systolic dysfunction or wall motion abnormalities, EF of greater than 60% and diastolic dysfunction, pending carotid Dopplers. Patient feels much better. She has ambulated to the bathroom without any dizziness or lightheadedness. 2. Diffuse colitis: Status post chemotherapy 5/16, Unclear etiology. C. difficile PCR negative. Continue empiric IV vancomycin, cefepime and Flagyl secondary to febrile neutropenia. Discontinue antibiotics as neutropenia has resolved? 3. Leukopenia/neutropenia and thrombocytopenia: Secondary to recent chemotherapy. No bleeding. Status post Neulasta on 5/17-no Neupogen at this time. Follow daily CBCs. Should be to continue antibiotics as neutropenia has resolved? 4. Elevated troponin: Cardiology input appreciated-indicate that this is less likely to be ACS but rule out chemotherapy related cardiotoxicity. Followup 2-D echo. 5. Breast cancer: Management per oncology. 6. Hypothyroidism 7. Dyslipidemia     Code Status: full Family Communication: family updated about patient's clinical progress Disposition Plan:  As above    Brief narrative: She was admitted on 5/21 with diarrhea, abdominal pain, complicated with syncopal episode and possible seizure activity. Labs demonstrated elevated troponins requiring Cardiology evaluation. It was felt that this elevation was secondary to demand ischemia due to tachicardia and possible infection. No heparin was initiated for no other EKG signs  were noted.Of note, 2 D echo was ordered. CT of the abdomen and pelvis revealed extensive colitis. C diff cultures and blood cultures obtained. CXR showed NAD. She was placed in Vanco , Cefepime and Flagyl per pharmacy.  Neutropenia was noted in CBC. Her admitting WBC 2.7 with ANC of 0.2 (in the setting of recent chemo. Patient received Neulasta on 5/17), and platelet count of 98k, now 87 k (200k on 5/16 without acute bleed). H/H were normal at 14.1/39.7 respectively.       Consultants:  Oncology  Cardiology    Procedures:  2-D echo    Antibiotics:  Cefepime vancomycin and Flagyl  HPI/Subjective: No complaints, no BM overnight  Objective: Filed Vitals:   09/02/12 1345 09/02/12 1350 09/02/12 1353 09/02/12 2027  BP: 138/58 125/72 116/66 128/70  Pulse: 96 105  97  Temp: 98.3 F (36.8 C)   98.4 F (36.9 C)  TempSrc: Oral   Oral  Resp:    16  Height:      Weight:    69.9 kg (154 lb 1.6 oz)  SpO2: 99%   100%    Intake/Output Summary (Last 24 hours) at 09/03/12 0845 Last data filed at 09/02/12 1500  Gross per 24 hour  Intake 906.25 ml  Output      0 ml  Net 906.25 ml    Exam:  General exam: Comfortable.  Respiratory system: Clear. No increased work of breathing.  Cardiovascular system: S1 & S2 heard, RRR. No JVD, murmurs, gallops, clicks or pedal edema. Telemetry: Sinus rhythm.  Gastrointestinal system: Abdomen is nondistended, soft and mildly tender in the lower quadrants but without rigidity, guarding or rebound. Normal bowel sounds heard.  Central nervous system: Alert and oriented. No focal neurological deficits.  Extremities: Symmetric 5 x 5 power.      Data Reviewed: Basic Metabolic Panel:  Recent  Labs Lab 09/02/12 0145 09/02/12 0650 09/03/12 0430  NA 134* 132* 135  K 3.6 3.4* 4.1  CL 100 99 104  CO2 21 21 22   GLUCOSE 117* 132* 105*  BUN 15 13 12   CREATININE 0.80 0.82 0.91  CALCIUM 8.5 8.4 8.2*  MG  --  1.7  --   PHOS  --  1.9*  --      Liver Function Tests:  Recent Labs Lab 09/02/12 0145 09/02/12 0650  AST 22 25  ALT 20 20  ALKPHOS 56 51  BILITOT 1.3* 1.3*  PROT 5.9* 5.7*  ALBUMIN 2.9* 2.7*    Recent Labs Lab 09/02/12 0145  LIPASE 41   No results found for this basename: AMMONIA,  in the last 168 hours  CBC:  Recent Labs Lab 09/02/12 0145 09/02/12 0650 09/03/12 0430  WBC 2.7* 3.0* 11.2*  NEUTROABS 0.2* 0.1* 3.8  HGB 14.1 13.0 13.2  HCT 39.7 37.3 37.8  MCV 86.7 86.7 87.9  PLT 98* 87* 140*    Cardiac Enzymes:  Recent Labs Lab 09/02/12 0145 09/02/12 0430 09/02/12 0650 09/02/12 1030 09/02/12 1630  TROPONINI 0.77* 0.73* 0.92* 0.93* 0.82*   BNP (last 3 results)  Recent Labs  09/02/12 0650  PROBNP 1049.0*     CBG:  Recent Labs Lab 09/02/12 0728 09/03/12 0731  GLUCAP 115* 107*    Recent Results (from the past 240 hour(s))  CULTURE, BLOOD (ROUTINE X 2)     Status: None   Collection Time    09/02/12 12:54 AM      Result Value Range Status   Specimen Description BLOOD LEFT HAND   Final   Special Requests BOTTLES DRAWN AEROBIC AND ANAEROBIC 5CC   Final   Culture  Setup Time 09/02/2012 08:53   Final   Culture     Final   Value:        BLOOD CULTURE RECEIVED NO GROWTH TO DATE CULTURE WILL BE HELD FOR 5 DAYS BEFORE ISSUING A FINAL NEGATIVE REPORT   Report Status PENDING   Incomplete  CULTURE, BLOOD (ROUTINE X 2)     Status: None   Collection Time    09/02/12  1:45 AM      Result Value Range Status   Specimen Description BLOOD LEFT CHEST PORT   Final   Special Requests BOTTLES DRAWN AEROBIC AND ANAEROBIC   Final   Culture  Setup Time 09/02/2012 08:54   Final   Culture     Final   Value:        BLOOD CULTURE RECEIVED NO GROWTH TO DATE CULTURE WILL BE HELD FOR 5 DAYS BEFORE ISSUING A FINAL NEGATIVE REPORT   Report Status PENDING   Incomplete  CLOSTRIDIUM DIFFICILE BY PCR     Status: None   Collection Time    09/02/12  4:16 AM      Result Value Range Status   C  difficile by pcr NEGATIVE  NEGATIVE Final     Studies: Dg Chest 2 View  09/02/2012   *RADIOLOGY REPORT*  Clinical Data: Fever.  Breast cancer.  CHEST - 2 VIEW  Comparison: 08/25/2012.  Findings: The Port-A-Cath is stable.  The cardiac silhouette, mediastinal and hilar contours are unchanged.  The lungs are clear. No pleural effusion. Surgical changes involving the right breast and is are noted.  The bony thorax is intact.  IMPRESSION: No acute cardiopulmonary findings.   Original Report Authenticated By: Rudie Meyer, M.D.   Ct Abdomen Pelvis W Contrast  09/02/2012   *RADIOLOGY REPORT*  Clinical Data: Abdominal pain and diarrhea.  CT ABDOMEN AND PELVIS WITH CONTRAST  Technique:  Multidetector CT imaging of the abdomen and pelvis was performed following the standard protocol during bolus administration of intravenous contrast.  Contrast: 50mL OMNIPAQUE IOHEXOL 300 MG/ML  SOLN, OMNIPAQUE IOHEXOL 300 MG/ML  SOLN  Comparison: 04/18/2005.  Findings: The lung bases are clear.  No pleural effusion.  No pulmonary nodules.  The solid abdominal organs are unremarkable.  The gallbladder is surgically absent.  No common bile duct dilatation.  A moderate sized duodenal diverticulum is noted.  A small hiatal hernia is noted.  The stomach, duodenum and small bowel are grossly normal.  There is moderate inflammation of the colon suspicious for C difficile colitis.  Other inflammatory or infectious colitis is possible.  The aorta is normal.  The branch vessels are patent.  No mesenteric or retroperitoneal mass or adenopathy.  Small lymph nodes are noted.  The portal and splenic veins are patent.  The renal veins are patent.  The uterus is surgically absent.  No pelvic mass or adenopathy.  No free pelvic fluid collections.  The bladder is normal.  The bony structures are unremarkable.  IMPRESSION:  1.  Diffuse colitis most likely C difficile colitis. 2.  No findings for metastatic disease involving the abdomen/pelvis.    Original Report Authenticated By: Rudie Meyer, M.D.   Dg Chest Port 1 View  08/25/2012   *RADIOLOGY REPORT*  Clinical Data: Evaluate Port-A-Cath.  Breast cancer.  PORTABLE CHEST - 1 VIEW  Comparison: MR breast 07/02/2012  Findings: A left subclavian power Port-A-Cath terminates in the proximal superior vena near the junction of the left brachiocephalic vein and superior vena cava, with the tip directed laterally.  Negative for pneumothorax.  Heart size is within normal limits. Lung volumes are low, and there is peribronchial thickening and probable mild atelectasis at the lung bases.  No definite airspace disease.  No visible pleural effusion.  Surgical clips project over the region of the right breast and in the right axilla.  No acute osseous abnormality.  IMPRESSION:  1.  Left subclavian power Port-A-Cath terminates in the proximal superior vena cava, with the tip directed laterally. 2.  Negative for pneumothorax. 3.  Mild bibasilar atelectasis and peribronchial thickening at the lung bases.   Original Report Authenticated By: Britta Mccreedy, M.D.   Dg Fluoro Guide Cv Line-no Report  08/25/2012   CLINICAL DATA: portacath   FLOURO GUIDE CV LINE  Fluoroscopy was utilized by the requesting physician.  No radiographic  interpretation.     Scheduled Meds: . atorvastatin  20 mg Oral q1800  . B-complex with vitamin C  1 tablet Oral Daily  . ceFEPime (MAXIPIME) IV  2 g Intravenous Q8H  . [START ON 09/08/2012] cholecalciferol  5,000 Units Oral Weekly  . levothyroxine  75 mcg Oral q morning - 10a  . loratadine  10 mg Oral q morning - 10a  . metoprolol succinate  25 mg Oral Daily  . metroNIDAZOLE  500 mg Oral Q8H  . pantoprazole  40 mg Oral q morning - 10a  . sodium chloride  10-40 mL Intracatheter Q12H  . sodium chloride  3 mL Intravenous Q12H  . vancomycin  750 mg Intravenous Q12H   Continuous Infusions:   Principal Problem:   Syncope Active Problems:   Cancer of lower-inner quadrant of female  breast   Elevated troponin   Thrombocytopenia   Neutropenia, febrile  Hypothyroidism   Dyslipidemia   Colitis    Time spent: 40 minutes   [facility]  [facility] Pager [phone]. If 8PM-8AM, please contact night-coverage at [email or web address], password [facility] 09/03/2012, 8:45 AM  LOS: 2 days

## 2012-09-03 NOTE — Progress Notes (Signed)
VASCULAR LAB PRELIMINARY  PRELIMINARY  PRELIMINARY  PRELIMINARY  Carotid duplex  completed.    Preliminary report:  Bilateral:  No evidence of hemodynamically significant internal carotid artery stenosis.   Vertebral artery flow is antegrade.      Shelia Obrien, RVT 09/03/2012, 11:35 AM

## 2012-09-04 LAB — CBC WITH DIFFERENTIAL/PLATELET
Basophils Relative: 0 % (ref 0–1)
Eosinophils Relative: 1 % (ref 0–5)
Hemoglobin: 12.7 g/dL (ref 12.0–15.0)
MCH: 30.2 pg (ref 26.0–34.0)
Monocytes Absolute: 2.7 10*3/uL — ABNORMAL HIGH (ref 0.1–1.0)
Monocytes Relative: 10 % (ref 3–12)
Neutrophils Relative %: 79 % — ABNORMAL HIGH (ref 43–77)
Platelets: 164 10*3/uL (ref 150–400)
RBC: 4.21 MIL/uL (ref 3.87–5.11)
WBC Morphology: INCREASED
WBC: 26.7 10*3/uL — ABNORMAL HIGH (ref 4.0–10.5)

## 2012-09-04 LAB — GLUCOSE, CAPILLARY: Glucose-Capillary: 97 mg/dL (ref 70–99)

## 2012-09-04 MED ORDER — HEPARIN SOD (PORK) LOCK FLUSH 100 UNIT/ML IV SOLN
500.0000 [IU] | INTRAVENOUS | Status: DC
  Administered 2012-09-04: 500 [IU]
  Filled 2012-09-04: qty 5

## 2012-09-04 MED ORDER — CIPROFLOXACIN HCL 500 MG PO TABS: 500.0000 mg | ORAL_TABLET | Freq: Two times a day (BID) | ORAL | Status: AC

## 2012-09-04 MED ORDER — METOPROLOL SUCCINATE ER 25 MG PO TB24: 25.0000 mg | ORAL_TABLET | Freq: Every day | ORAL | Status: DC

## 2012-09-04 MED ORDER — METRONIDAZOLE 500 MG PO TABS: 500.0000 mg | ORAL_TABLET | Freq: Three times a day (TID) | ORAL | Status: AC

## 2012-09-04 MED ORDER — HYDROCODONE-ACETAMINOPHEN 5-325 MG PO TABS: 1.0000 | ORAL_TABLET | Freq: Four times a day (QID) | ORAL | Status: DC | PRN

## 2012-09-04 MED ORDER — HEPARIN SOD (PORK) LOCK FLUSH 100 UNIT/ML IV SOLN
500.0000 [IU] | INTRAVENOUS | Status: DC | PRN
  Filled 2012-09-04: qty 5

## 2012-09-04 NOTE — Discharge Summary (Signed)
Physician Discharge Summary  Shelia Obrien MRN: [id number] DOB/AGE: Oct 19, 1940 72 y.o.  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     cholecalciferol 1000 UNITS tablet  Commonly known as:  VITAMIN D  Take 5,000 Units by mouth once a week. On Wednesdays     ciprofloxacin 500 MG tablet  Commonly known as:  CIPRO  Take 1 tablet (500 mg total) by mouth 2 (two) times daily.     dexamethasone 4 MG tablet  Commonly known as:  DECADRON  Take 2 tablets (8 mg total) by mouth 2 (two) times daily with a meal. Take two times a day the day before Taxotere. Then take two times a day starting the day after chemo for 3 days.     gabapentin 100 MG capsule  Commonly known as:  NEURONTIN  Take 100-300 mg by mouth daily as needed (for pain). Nerve pain     HYDROcodone-acetaminophen 5-325 MG per tablet  Commonly known as:  NORCO/VICODIN  Take 1 tablet by mouth every 6 (six) hours as needed for pain.     levothyroxine 75 MCG tablet  Commonly known as:  SYNTHROID, LEVOTHROID  Take 75 mcg by mouth every morning.     loperamide 2 MG capsule  Commonly known as:  IMODIUM  Take 2 mg by mouth 4 (four) times daily as needed for diarrhea or loose stools.     loratadine 10 MG tablet  Commonly known as:  CLARITIN  Take 10 mg by mouth every morning.     LORazepam 0.5 MG tablet  Commonly known as:  ATIVAN  Take 1 tablet (0.5 mg total) by mouth every 6 (six) hours as needed (Nausea or vomiting).     metoprolol succinate 25 MG 24 hr tablet  Commonly known as:  TOPROL-XL  Take 1 tablet (25 mg  total) by mouth daily.     metroNIDAZOLE 500 MG tablet  Commonly known as:  FLAGYL  Take 1 tablet (500 mg total) by mouth every 8 (eight) hours.     ondansetron 8 MG tablet  Commonly known as:  ZOFRAN  Take 8 mg by mouth every 12 (twelve) hours as needed for nausea. Take two times a day starting the day after chemo for 3 days. Then take two times a day as needed for nausea or vomiting.     pantoprazole 40 MG tablet  Commonly known as:  PROTONIX  Take 40 mg by mouth every morning.     rosuvastatin 10 MG tablet  Commonly known as:  CRESTOR  Take 10 mg by mouth every evening.        Discharge Condition: Stable  Disposition: 01-Home or Self Care   Consults: Oncology, cardiology   Significant Diagnostic Studies: Dg Chest 2 View  09/02/2012   *RADIOLOGY REPORT*  Clinical Data: Fever.  Breast cancer.  CHEST - 2 VIEW  Comparison: 08/25/2012.  Findings: The Port-A-Cath is stable.  The cardiac silhouette, mediastinal and hilar contours are unchanged.  The lungs are clear. No pleural effusion. Surgical changes  involving the right breast and is are noted.  The bony thorax is intact.  IMPRESSION: No acute cardiopulmonary findings.   Original Report Authenticated By: Rudie Meyer, M.D.   Ct Abdomen Pelvis W Contrast  09/02/2012   *RADIOLOGY REPORT*  Clinical Data: Abdominal pain and diarrhea.  CT ABDOMEN AND PELVIS WITH CONTRAST  Technique:  Multidetector CT imaging of the abdomen and pelvis was performed following the standard protocol during bolus administration of intravenous contrast.  Contrast: 50mL OMNIPAQUE IOHEXOL 300 MG/ML  SOLN, OMNIPAQUE IOHEXOL 300 MG/ML  SOLN  Comparison: 04/18/2005.  Findings: The lung bases are clear.  No pleural effusion.  No pulmonary nodules.  The solid abdominal organs are unremarkable.  The gallbladder is surgically absent.  No common bile duct dilatation.  A moderate sized duodenal diverticulum is noted.  A small hiatal hernia is noted.  The stomach,  duodenum and small bowel are grossly normal.  There is moderate inflammation of the colon suspicious for C difficile colitis.  Other inflammatory or infectious colitis is possible.  The aorta is normal.  The branch vessels are patent.  No mesenteric or retroperitoneal mass or adenopathy.  Small lymph nodes are noted.  The portal and splenic veins are patent.  The renal veins are patent.  The uterus is surgically absent.  No pelvic mass or adenopathy.  No free pelvic fluid collections.  The bladder is normal.  The bony structures are unremarkable.  IMPRESSION:  1.  Diffuse colitis most likely C difficile colitis. 2.  No findings for metastatic disease involving the abdomen/pelvis.   Original Report Authenticated By: Rudie Meyer, M.D.   Dg Chest Port 1 View  08/25/2012   *RADIOLOGY REPORT*  Clinical Data: Evaluate Port-A-Cath.  Breast cancer.  PORTABLE CHEST - 1 VIEW  Comparison: MR breast 07/02/2012  Findings: A left subclavian power Port-A-Cath terminates in the proximal superior vena near the junction of the left brachiocephalic vein and superior vena cava, with the tip directed laterally.  Negative for pneumothorax.  Heart size is within normal limits. Lung volumes are low, and there is peribronchial thickening and probable mild atelectasis at the lung bases.  No definite airspace disease.  No visible pleural effusion.  Surgical clips project over the region of the right breast and in the right axilla.  No acute osseous abnormality.  IMPRESSION:  1.  Left subclavian power Port-A-Cath terminates in the proximal superior vena cava, with the tip directed laterally. 2.  Negative for pneumothorax. 3.  Mild bibasilar atelectasis and peribronchial thickening at the lung bases.   Original Report Authenticated By: Britta Mccreedy, M.D.   Dg Fluoro Guide Cv Line-no Report  08/25/2012   CLINICAL DATA: portacath   FLOURO GUIDE CV LINE  Fluoroscopy was utilized by the requesting physician.  No radiographic  interpretation.       2-D echo LV EF: 65% - 70%  ------------------------------------------------------------ Indications: Syncope 780.2.  ------------------------------------------------------------ History: Risk factors: Dyslipidemia.  ------------------------------------------------------------ Study Conclusions  - Left ventricle: The cavity size was normal. Systolic function was vigorous. The estimated ejection fraction was in the range of 65% to 70%. Doppler parameters are consistent with abnormal left ventricular relaxation (grade 1 diastolic dysfunction). Doppler parameters are consistent with high ventricular filling pressure. - Mitral valve: Mild regurgitation. - Right ventricle: The cavity size was mildly dilated. - Pulmonary arteries: Systolic pressure was mildly increased. PA peak pressure: 32mm Hg (S).    Microbiology: Recent Results (from the past 240 hour(s))  CULTURE, BLOOD (ROUTINE X 2)  Status: None   Collection Time    09/02/12 12:54 AM      Result Value Range Status   Specimen Description BLOOD LEFT HAND   Final   Special Requests BOTTLES DRAWN AEROBIC AND ANAEROBIC 5CC   Final   Culture  Setup Time 09/02/2012 08:53   Final   Culture     Final   Value:        BLOOD CULTURE RECEIVED NO GROWTH TO DATE CULTURE WILL BE HELD FOR 5 DAYS BEFORE ISSUING A FINAL NEGATIVE REPORT   Report Status PENDING   Incomplete  CULTURE, BLOOD (ROUTINE X 2)     Status: None   Collection Time    09/02/12  1:45 AM      Result Value Range Status   Specimen Description BLOOD LEFT CHEST PORT   Final   Special Requests BOTTLES DRAWN AEROBIC AND ANAEROBIC   Final   Culture  Setup Time 09/02/2012 08:54   Final   Culture     Final   Value:        BLOOD CULTURE RECEIVED NO GROWTH TO DATE CULTURE WILL BE HELD FOR 5 DAYS BEFORE ISSUING A FINAL NEGATIVE REPORT   Report Status PENDING   Incomplete  CLOSTRIDIUM DIFFICILE BY PCR     Status: None   Collection Time    09/02/12  4:16 AM       Result Value Range Status   C difficile by pcr NEGATIVE  NEGATIVE Final     Labs: Results for orders placed during the hospital encounter of 09/01/12 (from the past 48 hour(s))  TROPONIN I     Status: Abnormal   Collection Time    09/02/12 10:30 AM      Result Value Range   Troponin I 0.93 (*) <0.30 ng/mL   Comment:            Due to the release kinetics of cTnI,     a negative result within the first hours     of the onset of symptoms does not rule out     myocardial infarction with certainty.     If myocardial infarction is still suspected,     repeat the test at appropriate intervals.     CRITICAL VALUE NOTED.  VALUE IS CONSISTENT WITH PREVIOUSLY REPORTED AND CALLED VALUE.  TROPONIN I     Status: Abnormal   Collection Time    09/02/12  4:30 PM      Result Value Range   Troponin I 0.82 (*) <0.30 ng/mL   Comment:            Due to the release kinetics of cTnI,     a negative result within the first hours     of the onset of symptoms does not rule out     myocardial infarction with certainty.     If myocardial infarction is still suspected,     repeat the test at appropriate intervals.     CRITICAL VALUE NOTED.  VALUE IS CONSISTENT WITH PREVIOUSLY REPORTED AND CALLED VALUE.  BASIC METABOLIC PANEL     Status: Abnormal   Collection Time    09/03/12  4:30 AM      Result Value Range   Sodium 135  135 - 145 mEq/L   Potassium 4.1  3.5 - 5.1 mEq/L   Comment: RESULT REPEATED AND VERIFIED     DELTA CHECK NOTED   Chloride 104  96 - 112 mEq/L   CO2  22  19 - 32 mEq/L   Glucose, Bld 105 (*) 70 - 99 mg/dL   BUN 12  6 - 23 mg/dL   Creatinine, Ser 1.61  0.50 - 1.10 mg/dL   Calcium 8.2 (*) 8.4 - 10.5 mg/dL   GFR calc non Af Amer 62 (*) >90 mL/min   GFR calc Af Amer 71 (*) >90 mL/min   Comment:            The eGFR has been calculated     using the CKD EPI equation.     This calculation has not been     validated in all clinical     situations.     eGFR's persistently     <90  mL/min signify     possible Chronic Kidney Disease.  CBC WITH DIFFERENTIAL     Status: Abnormal   Collection Time    09/03/12  4:30 AM      Result Value Range   WBC 11.2 (*) 4.0 - 10.5 K/uL   RBC 4.30  3.87 - 5.11 MIL/uL   Hemoglobin 13.2  12.0 - 15.0 g/dL   HCT 09.6  04.5 - 40.9 %   MCV 87.9  78.0 - 100.0 fL   MCH 30.7  26.0 - 34.0 pg   MCHC 34.9  30.0 - 36.0 g/dL   RDW 81.1  91.4 - 78.2 %   Platelets 140 (*) 150 - 400 K/uL   Comment: REPEATED TO VERIFY     DELTA CHECK NOTED   Neutrophils Relative % 34 (*) 43 - 77 %   Lymphocytes Relative 36  12 - 46 %   Monocytes Relative 28 (*) 3 - 12 %   Eosinophils Relative 1  0 - 5 %   Basophils Relative 1  0 - 1 %   Neutro Abs 3.8  1.7 - 7.7 K/uL   Lymphs Abs 4.1 (*) 0.7 - 4.0 K/uL   Monocytes Absolute 3.1 (*) 0.1 - 1.0 K/uL   Eosinophils Absolute 0.1  0.0 - 0.7 K/uL   Basophils Absolute 0.1  0.0 - 0.1 K/uL   WBC Morphology ATYPICAL LYMPHOCYTES     Comment: TOXIC GRANULATION     DOHLE BODIES     MODERATE LEFT SHIFT (>5% METAS AND MYELOS,OCC PRO NOTED)  GLUCOSE, CAPILLARY     Status: Abnormal   Collection Time    09/03/12  7:31 AM      Result Value Range   Glucose-Capillary 107 (*) 70 - 99 mg/dL  CBC WITH DIFFERENTIAL     Status: Abnormal   Collection Time    09/04/12  4:45 AM      Result Value Range   WBC 26.7 (*) 4.0 - 10.5 K/uL   RBC 4.21  3.87 - 5.11 MIL/uL   Hemoglobin 12.7  12.0 - 15.0 g/dL   HCT 95.6  21.3 - 08.6 %   MCV 88.6  78.0 - 100.0 fL   MCH 30.2  26.0 - 34.0 pg   MCHC 34.0  30.0 - 36.0 g/dL   RDW 57.8  46.9 - 62.9 %   Platelets 164  150 - 400 K/uL   Neutrophils Relative % 79 (*) 43 - 77 %   Lymphocytes Relative 10 (*) 12 - 46 %   Monocytes Relative 10  3 - 12 %   Eosinophils Relative 1  0 - 5 %   Basophils Relative 0  0 - 1 %   Neutro Abs 21.0 (*) 1.7 - 7.7  K/uL   Lymphs Abs 2.7  0.7 - 4.0 K/uL   Monocytes Absolute 2.7 (*) 0.1 - 1.0 K/uL   Eosinophils Absolute 0.3  0.0 - 0.7 K/uL   Basophils Absolute 0.0   0.0 - 0.1 K/uL   RBC Morphology RARE NRBCs     Comment: POLYCHROMASIA PRESENT   WBC Morphology INCREASED BANDS (>20% BANDS)     Comment: MODERATE LEFT SHIFT (>5% METAS AND MYELOS,OCC PRO NOTED)  GLUCOSE, CAPILLARY     Status: None   Collection Time    09/04/12  7:58 AM      Result Value Range   Glucose-Capillary 97  70 - 99 mg/dL     HPI : She was admitted on 5/21 with diarrhea, abdominal pain, complicated with syncopal episode and possible seizure activity. Labs demonstrated elevated troponins requiring Cardiology evaluation. It was felt that this elevation was secondary to demand ischemia due to tachicardia and possible infection. No heparin was initiated for no other EKG signs were noted.Of note, 2 D echo was ordered. CT of the abdomen and pelvis revealed extensive colitis. C diff cultures and blood cultures obtained. CXR showed NAD. She was placed in Vanco , Cefepime and Flagyl per pharmacy.  Neutropenia was noted in CBC. Her admitting WBC 2.7 with ANC of 0.2 (in the setting of recent chemo. Patient received Neulasta on 5/17), and platelet count of 98k, now 87 k (200k on 5/16 without acute bleed). H/H were normal at 14.1/39.7 respectively.    HOSPITAL COURSE:  1. Syncope:? Orthostatic hypotension from dehydration versus vasovagal. Less likely due to ACS. Given her elevated cardiac enzymes, cardiology was consulted, she had her 2-D echo showed no evidence of systolic dysfunction or wall motion abnormalities, EF of greater than 60% and diastolic dysfunction, negative carotid Dopplers. Patient feels much better. She has ambulated to the bathroom without any dizziness or lightheadedness. Seen by Dr. Anne Fu today, they recommend continuation of beta blocker, outpatient followup with Dr. Katrinka Blazing with an outpatient stress test.   2. Diffuse colitis: Status post chemotherapy 5/16, could be related to her chemotherapy C. difficile PCR negative. Started on empiric IV vancomycin, cefepime and Flagyl  secondary to febrile neutropenia. Discussed with Dr. Truett Perna prior to patient's discharge, her diarrhea has resolved, this could be a side effect of her chemotherapy including neutropenic colitis, continue Flagyl and change to Cipro orally for another 4 days and then discontinue   3. Leukopenia/neutropenia and thrombocytopenia: Secondary to recent chemotherapy. No bleeding. Status post Neulasta on 5/17-no Neupogen at this time. Total WBC of 26.7. Hemoglobin and platelets are stable prior to discharge   4. Elevated troponin: Cardiology input appreciated-indicate that this is less likely to be ACS could be related to chemotherapy related cardiotoxicity. 2-D echo reassuring 5. Breast cancer: Management per oncology. 6. Hypothyroidism 7. Dyslipidemia 8.     Discharge Exam: * Blood pressure 109/56, pulse 83, temperature 98.9 F (37.2 C), temperature source Oral, resp. rate 16, height 5\' 3"  (1.6 m), weight 69.4 kg (153 lb), SpO2 98.00%. General: Well developed, well nourished, in no acute distress.  Head: Normocephalic and atraumatic.  Lungs: Clear to auscultation and percussion.  Heart: Normal S1 and S2. No murmur, rubs or gallops.  Abdomen: soft, non-tender, positive bowel sounds.  Extremities: No clubbing or cyanosis. No edema.  Neurologic: Alert and oriented x 3.          Future Appointments Provider Department Dept Phone   09/17/2012 10:30 AM [facility]  [phone]   09/17/2012 11:00 AM [facility] [phone]   09/18/2012 9:30 AM [facility] [phone]   10/08/2012 9:30 AM Beverely Pace [facility] [phone]   10/08/2012 10:00 AM [facility] [place] [facility] [phone]   10/09/2012 9:00 AM [facility] Inj Nurse [place] [facility] [phone]    10/29/2012 8:00 AM Krista Blue [facility] [phone]   10/29/2012 8:30 AM [facility] [phone]   10/30/2012 9:00 AM [facility] [phone]      Follow-up Information   Follow up with PERINI,MARK A, MD. Schedule an appointment as soon as possible for a visit in 1 week.   Contact information:   [address] [place] [address] [phone]       Follow up with Lesleigh Noe, MD. Schedule an appointment as soon as possible for a visit in 1 week. (For outpatient stress test)    Contact information:   [address] [place] [address] [phone]       Signed: Richarda Overlie 09/04/2012, 9:20 AM

## 2012-09-04 NOTE — Progress Notes (Signed)
Subjective:  No further syncope, no dizziness, no CP, no SOB  Objective:  Vital Signs in the last 24 hours: Temp:  [98.5 F (36.9 C)-98.9 F (37.2 C)] 98.9 F (37.2 C) (05/24 0523) Pulse Rate:  [83-91] 83 (05/24 0523) Resp:  [16-18] 16 (05/24 0523) BP: (109-129)/(54-80) 109/56 mmHg (05/24 0523) SpO2:  [97 %-100 %] 98 % (05/24 0523) Weight:  [69.4 kg (153 lb)] 69.4 kg (153 lb) (05/24 0523)  Intake/Output from previous day: 05/23 0701 - 05/24 0700 In: 690 [P.O.:240; IV Piggyback:450] Out: -    Physical Exam: General: Well developed, well nourished, in no acute distress. Head:  Normocephalic and atraumatic. Lungs: Clear to auscultation and percussion. Heart: Normal S1 and S2.  No murmur, rubs or gallops.  Abdomen: soft, non-tender, positive bowel sounds. Extremities: No clubbing or cyanosis. No edema. Neurologic: Alert and oriented x 3.    Lab Results:  Recent Labs  09/03/12 0430 09/04/12 0445  WBC 11.2* 26.7*  HGB 13.2 12.7  PLT 140* 164    Recent Labs  09/02/12 0650 09/03/12 0430  NA 132* 135  K 3.4* 4.1  CL 99 104  CO2 21 22  GLUCOSE 132* 105*  BUN 13 12  CREATININE 0.82 0.91    Recent Labs  09/02/12 1030 09/02/12 1630  TROPONINI 0.93* 0.82*   Hepatic Function Panel  Recent Labs  09/02/12 0650  PROT 5.7*  ALBUMIN 2.7*  AST 25  ALT 20  ALKPHOS 51  BILITOT 1.3*  .   Telemetry: NSR, no adverse rhythms Personally viewed.  Cardiac Panel (last 3 results)  Recent Labs  09/02/12 0650 09/02/12 1030 09/02/12 1630  TROPONINI 0.92* 0.93* 0.82*    Assessment/Plan:  Principal Problem:   Syncope Active Problems:   Cancer of lower-inner quadrant of female breast   Elevated troponin   Thrombocytopenia   Neutropenia, febrile   Hypothyroidism   Dyslipidemia   Colitis  ASSESSMENT:  1. Syncope of uncertain etiology. No arrhythmia identified so far. Likely vasovagal or volume contraction related.  2. Elevated cardiac markers including 3  troponin I and BNP levels. This may be a signal suggesting myocardial injury possibly the raising the question of acute cardiotoxicity from chemotherapy. This does not sound like acute coronary syndrome/unstable angina.  3. Breast cancer with one course of chemotherapy using Taxotere and Cytoxan 4. Leukocytosis - per primary team.   PLAN:   - ECHO with normal EF, no WMA. Reassuring  - tele reassuring  - Plan on outpatient follow up with Dr. Katrinka Blazing with OP stress test when able to complete. Office has already called.   - Continue Beta blocker if BP allows.   - On atorvastatin.       Shelia Obrien 09/04/2012, 7:43 AM

## 2012-09-08 LAB — CULTURE, BLOOD (ROUTINE X 2): Culture: NO GROWTH

## 2012-09-09 ENCOUNTER — Encounter (INDEPENDENT_AMBULATORY_CARE_PROVIDER_SITE_OTHER): Payer: Medicare Other | Admitting: General Surgery

## 2012-09-10 ENCOUNTER — Other Ambulatory Visit: Payer: Self-pay | Admitting: Emergency Medicine

## 2012-09-10 ENCOUNTER — Telehealth: Payer: Self-pay | Admitting: *Deleted

## 2012-09-10 NOTE — Telephone Encounter (Signed)
Per staff message and POF I have scheduled appts.  JMW  

## 2012-09-12 DIAGNOSIS — I10 Essential (primary) hypertension: Secondary | ICD-10-CM

## 2012-09-12 HISTORY — DX: Essential (primary) hypertension: I10

## 2012-09-13 ENCOUNTER — Telehealth: Payer: Self-pay | Admitting: Dietician

## 2012-09-13 NOTE — Telephone Encounter (Signed)
Brief Outpatient Oncology Nutrition Note  Patient has been identified to be at risk on malnutrition screen.  Wt Readings from Last 10 Encounters:  09/04/12 153 lb (69.4 kg)  08/30/12 155 lb 6.4 oz (70.489 kg)  08/27/12 156 lb 4.8 oz (70.897 kg)  08/25/12 156 lb 9.6 oz (71.033 kg)  08/25/12 156 lb 9.6 oz (71.033 kg)  08/13/12 157 lb 12.8 oz (71.578 kg)  08/09/12 155 lb 12.8 oz (70.67 kg)  07/16/12 159 lb 6.3 oz (72.3 kg)  07/07/12 161 lb (73.029 kg)     Patient with recent hospitalization seen by inpatient RD 5/24.  Called and spoke with patient who reports now eating well and "getting stronger every day".  Encouraged patient to call with any nutrition related questions.  Oran Rein, RD, LDN

## 2012-09-17 ENCOUNTER — Telehealth: Payer: Self-pay | Admitting: *Deleted

## 2012-09-17 ENCOUNTER — Encounter: Payer: Self-pay | Admitting: Oncology

## 2012-09-17 ENCOUNTER — Other Ambulatory Visit (HOSPITAL_BASED_OUTPATIENT_CLINIC_OR_DEPARTMENT_OTHER): Payer: Medicare Other

## 2012-09-17 ENCOUNTER — Telehealth: Payer: Self-pay | Admitting: Oncology

## 2012-09-17 ENCOUNTER — Ambulatory Visit (HOSPITAL_BASED_OUTPATIENT_CLINIC_OR_DEPARTMENT_OTHER): Payer: Medicare Other | Admitting: Oncology

## 2012-09-17 ENCOUNTER — Ambulatory Visit (HOSPITAL_BASED_OUTPATIENT_CLINIC_OR_DEPARTMENT_OTHER): Payer: Medicare Other

## 2012-09-17 ENCOUNTER — Other Ambulatory Visit: Payer: Medicare Other | Admitting: Lab

## 2012-09-17 VITALS — BP 114/67 | HR 91 | Temp 98.2°F | Resp 20 | Ht 63.0 in | Wt 153.9 lb

## 2012-09-17 DIAGNOSIS — C50319 Malignant neoplasm of lower-inner quadrant of unspecified female breast: Secondary | ICD-10-CM

## 2012-09-17 DIAGNOSIS — C50311 Malignant neoplasm of lower-inner quadrant of right female breast: Secondary | ICD-10-CM

## 2012-09-17 DIAGNOSIS — E860 Dehydration: Secondary | ICD-10-CM

## 2012-09-17 DIAGNOSIS — Z5111 Encounter for antineoplastic chemotherapy: Secondary | ICD-10-CM

## 2012-09-17 DIAGNOSIS — Z452 Encounter for adjustment and management of vascular access device: Secondary | ICD-10-CM

## 2012-09-17 DIAGNOSIS — Z170 Estrogen receptor positive status [ER+]: Secondary | ICD-10-CM

## 2012-09-17 LAB — CBC WITH DIFFERENTIAL/PLATELET
Basophils Absolute: 0 10*3/uL (ref 0.0–0.1)
Eosinophils Absolute: 0 10*3/uL (ref 0.0–0.5)
HGB: 13.3 g/dL (ref 11.6–15.9)
MONO#: 0.5 10*3/uL (ref 0.1–0.9)
MONO%: 4 % (ref 0.0–14.0)
NEUT#: 11.2 10*3/uL — ABNORMAL HIGH (ref 1.5–6.5)
RBC: 4.3 10*6/uL (ref 3.70–5.45)
RDW: 15.1 % — ABNORMAL HIGH (ref 11.2–14.5)
WBC: 13.1 10*3/uL — ABNORMAL HIGH (ref 3.9–10.3)
lymph#: 1.4 10*3/uL (ref 0.9–3.3)

## 2012-09-17 LAB — COMPREHENSIVE METABOLIC PANEL (CC13)
Albumin: 3.3 g/dL — ABNORMAL LOW (ref 3.5–5.0)
Alkaline Phosphatase: 48 U/L (ref 40–150)
BUN: 16.7 mg/dL (ref 7.0–26.0)
CO2: 22 mEq/L (ref 22–29)
Calcium: 9.3 mg/dL (ref 8.4–10.4)
Chloride: 109 mEq/L — ABNORMAL HIGH (ref 98–107)
Glucose: 114 mg/dl — ABNORMAL HIGH (ref 70–99)
Potassium: 4.1 mEq/L (ref 3.5–5.1)
Sodium: 139 mEq/L (ref 136–145)
Total Protein: 6.5 g/dL (ref 6.4–8.3)

## 2012-09-17 MED ORDER — ALTEPLASE 2 MG IJ SOLR
2.0000 mg | Freq: Once | INTRAMUSCULAR | Status: AC | PRN
  Administered 2012-09-17: 2 mg
  Filled 2012-09-17: qty 2

## 2012-09-17 MED ORDER — LORAZEPAM 2 MG/ML IJ SOLN
0.5000 mg | Freq: Once | INTRAMUSCULAR | Status: AC
  Administered 2012-09-17: 0.5 mg via INTRAVENOUS

## 2012-09-17 MED ORDER — SODIUM CHLORIDE 0.9 % IV SOLN
515.0000 mg/m2 | Freq: Once | INTRAVENOUS | Status: AC
  Administered 2012-09-17: 920 mg via INTRAVENOUS
  Filled 2012-09-17: qty 46

## 2012-09-17 MED ORDER — SODIUM CHLORIDE 0.9 % IV SOLN
Freq: Once | INTRAVENOUS | Status: AC
  Administered 2012-09-17: 13:00:00 via INTRAVENOUS

## 2012-09-17 MED ORDER — ONDANSETRON 16 MG/50ML IVPB (CHCC)
16.0000 mg | Freq: Once | INTRAVENOUS | Status: AC
  Administered 2012-09-17: 16 mg via INTRAVENOUS

## 2012-09-17 MED ORDER — HEPARIN SOD (PORK) LOCK FLUSH 100 UNIT/ML IV SOLN
500.0000 [IU] | Freq: Once | INTRAVENOUS | Status: AC | PRN
  Administered 2012-09-17: 500 [IU]
  Filled 2012-09-17: qty 5

## 2012-09-17 MED ORDER — SODIUM CHLORIDE 0.9 % IV SOLN
INTRAVENOUS | Status: DC
  Administered 2012-09-17: 13:00:00 via INTRAVENOUS

## 2012-09-17 MED ORDER — SODIUM CHLORIDE 0.9 % IJ SOLN
10.0000 mL | INTRAMUSCULAR | Status: DC | PRN
  Administered 2012-09-17: 10 mL
  Filled 2012-09-17: qty 10

## 2012-09-17 MED ORDER — DEXAMETHASONE SODIUM PHOSPHATE 20 MG/5ML IJ SOLN
20.0000 mg | Freq: Once | INTRAMUSCULAR | Status: AC
  Administered 2012-09-17: 20 mg via INTRAVENOUS

## 2012-09-17 MED ORDER — DOCETAXEL CHEMO INJECTION 160 MG/16ML
62.5000 mg/m2 | Freq: Once | INTRAVENOUS | Status: AC
  Administered 2012-09-17: 110 mg via INTRAVENOUS
  Filled 2012-09-17: qty 11

## 2012-09-17 NOTE — Progress Notes (Signed)
Cathflo dwelled from 1147 to 1227.  Positive blood return noted, 10cc drawn back to waste.  SLJ

## 2012-09-17 NOTE — Patient Instructions (Signed)
[facility] Discharge Instructions for Patients Receiving Chemotherapy  Today you received the following chemotherapy agents taxotere, cytoxan  To help prevent nausea and vomiting after your treatment, we encourage you to take your nausea medication as needed   If you develop nausea and vomiting that is not controlled by your nausea medication, call the clinic.   BELOW ARE SYMPTOMS THAT SHOULD BE REPORTED IMMEDIATELY:  *FEVER GREATER THAN 100.5 F  *CHILLS WITH OR WITHOUT FEVER  NAUSEA AND VOMITING THAT IS NOT CONTROLLED WITH YOUR NAUSEA MEDICATION  *UNUSUAL SHORTNESS OF BREATH  *UNUSUAL BRUISING OR BLEEDING  TENDERNESS IN MOUTH AND THROAT WITH OR WITHOUT PRESENCE OF ULCERS  *URINARY PROBLEMS  *BOWEL PROBLEMS  UNUSUAL RASH Items with * indicate a potential emergency and should be followed up as soon as possible.  Feel free to call the clinic you have any questions or concerns. The clinic phone number is [phone].    

## 2012-09-17 NOTE — Telephone Encounter (Signed)
Per staff phone call and POF I have schedueld appts.  JMW  

## 2012-09-17 NOTE — Patient Instructions (Addendum)
We reduced the dose of chemotherapy to your ideal body weight  You will receive IVF today, tomorrow and on Monday  We will see you back on 6/9 and 6/13

## 2012-09-18 ENCOUNTER — Ambulatory Visit: Payer: Medicare Other

## 2012-09-18 ENCOUNTER — Ambulatory Visit (HOSPITAL_BASED_OUTPATIENT_CLINIC_OR_DEPARTMENT_OTHER): Payer: Medicare Other

## 2012-09-18 VITALS — BP 111/52 | HR 78 | Temp 97.8°F

## 2012-09-18 DIAGNOSIS — E860 Dehydration: Secondary | ICD-10-CM

## 2012-09-18 DIAGNOSIS — C50319 Malignant neoplasm of lower-inner quadrant of unspecified female breast: Secondary | ICD-10-CM

## 2012-09-18 DIAGNOSIS — C50311 Malignant neoplasm of lower-inner quadrant of right female breast: Secondary | ICD-10-CM

## 2012-09-18 MED ORDER — SODIUM CHLORIDE 0.9 % IJ SOLN
10.0000 mL | INTRAMUSCULAR | Status: DC | PRN
  Administered 2012-09-18: 10 mL via INTRAVENOUS
  Filled 2012-09-18: qty 10

## 2012-09-18 MED ORDER — PEGFILGRASTIM INJECTION 6 MG/0.6ML
6.0000 mg | Freq: Once | SUBCUTANEOUS | Status: AC
  Administered 2012-09-18: 6 mg via SUBCUTANEOUS

## 2012-09-18 MED ORDER — HEPARIN SOD (PORK) LOCK FLUSH 100 UNIT/ML IV SOLN
500.0000 [IU] | Freq: Once | INTRAVENOUS | Status: AC
  Administered 2012-09-18: 500 [IU] via INTRAVENOUS
  Filled 2012-09-18: qty 5

## 2012-09-18 MED ORDER — SODIUM CHLORIDE 0.9 % IV SOLN
INTRAVENOUS | Status: DC
  Administered 2012-09-18: 09:00:00 via INTRAVENOUS

## 2012-09-18 NOTE — Patient Instructions (Signed)
Dehydration, Adult Dehydration is when you lose more fluids from the body than you take in. Vital organs like the kidneys, brain, and heart cannot function without a proper amount of fluids and salt. Any loss of fluids from the body can cause dehydration.  CAUSES   Vomiting.  Diarrhea.  Excessive sweating.  Excessive urine output.  Fever. SYMPTOMS  Mild dehydration  Thirst.  Dry lips.  Slightly dry mouth. Moderate dehydration  Very dry mouth.  Sunken eyes.  Skin does not bounce back quickly when lightly pinched and released.  Dark urine and decreased urine production.  Decreased tear production.  Headache. Severe dehydration  Very dry mouth.  Extreme thirst.  Rapid, weak pulse (more than 100 beats per minute at rest).  Cold hands and feet.  Not able to sweat in spite of heat and temperature.  Rapid breathing.  Blue lips.  Confusion and lethargy.  Difficulty being awakened.  Minimal urine production.  No tears. DIAGNOSIS  Your caregiver will diagnose dehydration based on your symptoms and your exam. Blood and urine tests will help confirm the diagnosis. The diagnostic evaluation should also identify the cause of dehydration. TREATMENT  Treatment of mild or moderate dehydration can often be done at home by increasing the amount of fluids that you drink. It is best to drink small amounts of fluid more often. Drinking too much at one time can make vomiting worse. Refer to the home care instructions below. Severe dehydration needs to be treated at the hospital where you will probably be given intravenous (IV) fluids that contain water and electrolytes. HOME CARE INSTRUCTIONS   Ask your caregiver about specific rehydration instructions.  Drink enough fluids to keep your urine clear or pale yellow.  Drink small amounts frequently if you have nausea and vomiting.  Eat as you normally do.  Avoid:  Foods or drinks high in sugar.  Carbonated  drinks.  Juice.  Extremely hot or cold fluids.  Drinks with caffeine.  Fatty, greasy foods.  Alcohol.  Tobacco.  Overeating.  Gelatin desserts.  Wash your hands well to avoid spreading bacteria and viruses.  Only take over-the-counter or prescription medicines for pain, discomfort, or fever as directed by your caregiver.  Ask your caregiver if you should continue all prescribed and over-the-counter medicines.  Keep all follow-up appointments with your caregiver. SEEK MEDICAL CARE IF:  You have abdominal pain and it increases or stays in one area (localizes).  You have a rash, stiff neck, or severe headache.  You are irritable, sleepy, or difficult to awaken.  You are weak, dizzy, or extremely thirsty. SEEK IMMEDIATE MEDICAL CARE IF:   You are unable to keep fluids down or you get worse despite treatment.  You have frequent episodes of vomiting or diarrhea.  You have blood or green matter (bile) in your vomit.  You have blood in your stool or your stool looks black and tarry.  You have not urinated in 6 to 8 hours, or you have only urinated a small amount of very dark urine.  You have a fever.  You faint. MAKE SURE YOU:   Understand these instructions.  Will watch your condition.  Will get help right away if you are not doing well or get worse. Document Released: 03/31/2005 Document Revised: 06/23/2011 Document Reviewed: 11/18/2010 [employer] Patient Information 2014 [employer].  

## 2012-09-20 ENCOUNTER — Encounter: Payer: Self-pay | Admitting: Oncology

## 2012-09-20 ENCOUNTER — Ambulatory Visit (HOSPITAL_BASED_OUTPATIENT_CLINIC_OR_DEPARTMENT_OTHER): Payer: Medicare Other

## 2012-09-20 ENCOUNTER — Ambulatory Visit (HOSPITAL_BASED_OUTPATIENT_CLINIC_OR_DEPARTMENT_OTHER): Payer: Medicare Other | Admitting: Oncology

## 2012-09-20 ENCOUNTER — Other Ambulatory Visit: Payer: Self-pay | Admitting: *Deleted

## 2012-09-20 ENCOUNTER — Other Ambulatory Visit (HOSPITAL_BASED_OUTPATIENT_CLINIC_OR_DEPARTMENT_OTHER): Payer: Medicare Other | Admitting: Lab

## 2012-09-20 VITALS — BP 131/70 | HR 74 | Temp 97.8°F | Resp 20 | Ht 63.0 in | Wt 158.9 lb

## 2012-09-20 DIAGNOSIS — C50319 Malignant neoplasm of lower-inner quadrant of unspecified female breast: Secondary | ICD-10-CM

## 2012-09-20 DIAGNOSIS — E860 Dehydration: Secondary | ICD-10-CM

## 2012-09-20 DIAGNOSIS — C50311 Malignant neoplasm of lower-inner quadrant of right female breast: Secondary | ICD-10-CM

## 2012-09-20 LAB — CBC WITH DIFFERENTIAL/PLATELET
BASO%: 1.4 % (ref 0.0–2.0)
MCHC: 33.4 g/dL (ref 31.5–36.0)
MONO#: 0.5 10*3/uL (ref 0.1–0.9)
NEUT#: 28.9 10*3/uL — ABNORMAL HIGH (ref 1.5–6.5)
RBC: 3.93 10*6/uL (ref 3.70–5.45)
RDW: 14.9 % — ABNORMAL HIGH (ref 11.2–14.5)
WBC: 30.5 10*3/uL — ABNORMAL HIGH (ref 3.9–10.3)
lymph#: 0.7 10*3/uL — ABNORMAL LOW (ref 0.9–3.3)
nRBC: 0 % (ref 0–0)

## 2012-09-20 LAB — COMPREHENSIVE METABOLIC PANEL (CC13)
ALT: 23 U/L (ref 0–55)
AST: 18 U/L (ref 5–34)
Albumin: 3.2 g/dL — ABNORMAL LOW (ref 3.5–5.0)
Calcium: 8.7 mg/dL (ref 8.4–10.4)
Chloride: 106 mEq/L (ref 98–107)
Creatinine: 0.7 mg/dL (ref 0.6–1.1)
Potassium: 4.1 mEq/L (ref 3.5–5.1)
Sodium: 138 mEq/L (ref 136–145)
Total Protein: 5.9 g/dL — ABNORMAL LOW (ref 6.4–8.3)

## 2012-09-20 MED ORDER — SODIUM CHLORIDE 0.9 % IJ SOLN
10.0000 mL | INTRAMUSCULAR | Status: DC | PRN
  Administered 2012-09-20: 10 mL via INTRAVENOUS
  Filled 2012-09-20: qty 10

## 2012-09-20 MED ORDER — SODIUM CHLORIDE 0.9 % IV SOLN
INTRAVENOUS | Status: DC
  Administered 2012-09-20: 13:00:00 via INTRAVENOUS

## 2012-09-20 MED ORDER — HEPARIN SOD (PORK) LOCK FLUSH 100 UNIT/ML IV SOLN
500.0000 [IU] | Freq: Once | INTRAVENOUS | Status: AC
  Administered 2012-09-20: 500 [IU] via INTRAVENOUS
  Filled 2012-09-20: qty 5

## 2012-09-20 NOTE — Progress Notes (Signed)
OFFICE PROGRESS NOTE  CC  Shelia Kayser, MD [address] [address] Dr. Almond Lint  Dr. 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   STAGE:  Cancer of lower-inner quadrant of female breast  Primary site: Breast (Right)  Staging method: AJCC 7th Edition  Clinical: Stage IA (T1c, N0, cM0)  Summary: Stage IA (T1c, N0, cM0)   PRIOR THERAPY: #1 patient originally presented at the [facility] with a mammogram that showed architectural distortion. Ultrasound showed a 1.2 cm mass in the 4:00 position of the right breast. MRI confirmed a solitary lesion measuring 1.6 cm up. 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  #2 patient is status post lumpectomy with sentinel lymph node biopsy the final pathology revealed a 1.7 cm invasive lobular carcinoma. Tumor was ER positive PR positive HER-2/neu negative with a Ki-67 of 12% 4 sentinel nodes were negative for metastatic disease.  #3 patient had Oncotype DX testing performed her recurrence score was 29 giving her a 19% risk of distant recurrence with adjuvant hormonal therapy only. Patient and I and her family discussed her Oncotype DX testing this puts her in the intermediate risk category. We discussed side effects of chemotherapy types of chemotherapy. I would recommend Taxotere and Cytoxan every 3 weeks for 4 cycles. We can certainly begin this as soon as the Port-A-Cath is placed.  #4 patient had her first cycle of chemotherapy on 08/28/2011. Unfortunately she did develop significant side effects that did require hospitalization. Because of this I am going to plan on using her doses of Taxotere as well as Cytoxan 2 ideal BSA. I have explained this rationale to the patient as well as her daughter who accompanies her today.  CURRENT THERAPY:adjuvant chemotherapy  consisting of cycle 2 Taxotere Cytoxan q. 21 days   The doses will be based on ideal BSA  INTERVAL HISTORY: Shelia Obrien 72 y.o. female returns for followup visit today. Patient seems to be doing a little bit better since dose reduction. She is weak today. She has been trying to drink. She does say that she has a little bit of difficulty with her vision when she takes the dexamethasone. She has no nausea or vomiting no fevers chills or night sweats. Remainder of the 10 point review of systems is negative.  MEDICAL HISTORY: Past Medical History  Diagnosis Date  . Bronchitis   . Thyroid disease   . Acid reflux disease   . High cholesterol   . Breast cancer   . Seasonal allergies   . Hypothyroidism   . Leaking of urine     Dribbles if coughs. Pt wears pad  . Epistaxis     went to [facility] ER  . Shortness of breath   . PONV (postoperative nausea and vomiting)   . Wears glasses   . Arthritis     ALLERGIES:  has No Known Allergies.  MEDICATIONS:  Current Outpatient Prescriptions  Medication Sig Dispense Refill  . bifidobacterium infantis (ALIGN) capsule Take 1 capsule by mouth daily.      . cholecalciferol (VITAMIN Obrien) 1000 UNITS tablet Take 5,000 Units by mouth once a week. On Wednesdays      . dexamethasone (DECADRON) 4 MG tablet Take 2 tablets (8 mg total) by mouth 2 (two) times daily with a meal. Take two times a day the  day before Taxotere. Then take two times a day starting the day after chemo for 3 days.  30 tablet  1  . levothyroxine (SYNTHROID, LEVOTHROID) 75 MCG tablet Take 75 mcg by mouth every morning.       . lidocaine-prilocaine (EMLA) cream       . loratadine (CLARITIN) 10 MG tablet Take 10 mg by mouth every morning.      . metoprolol succinate (TOPROL-XL) 25 MG 24 hr tablet Take 1 tablet (25 mg total) by mouth daily.  30 tablet  3  . pantoprazole (PROTONIX) 40 MG tablet Take 40 mg by mouth every morning.       . rosuvastatin (CRESTOR) 10 MG tablet Take 10 mg by  mouth every evening.       Marland Kitchen b complex vitamins tablet Take 1 tablet by mouth every morning.       . gabapentin (NEURONTIN) 100 MG capsule Take 100-300 mg by mouth daily as needed (for pain). Nerve pain      . HYDROcodone-acetaminophen (NORCO/VICODIN) 5-325 MG per tablet Take 1 tablet by mouth every 6 (six) hours as needed for pain.  30 tablet  0  . loperamide (IMODIUM) 2 MG capsule Take 2 mg by mouth 4 (four) times daily as needed for diarrhea or loose stools.      Marland Kitchen LORazepam (ATIVAN) 0.5 MG tablet Take 1 tablet (0.5 mg total) by mouth every 6 (six) hours as needed (Nausea or vomiting).  30 tablet  0  . ondansetron (ZOFRAN) 8 MG tablet Take 8 mg by mouth every 12 (twelve) hours as needed for nausea. Take two times a day starting the day after chemo for 3 days. Then take two times a day as needed for nausea or vomiting.      Marland Kitchen oxyCODONE-acetaminophen (PERCOCET/ROXICET) 5-325 MG per tablet       . prochlorperazine (COMPAZINE) 10 MG tablet       . valACYclovir (VALTREX) 1000 MG tablet        No current facility-administered medications for this visit.    SURGICAL HISTORY:  Past Surgical History  Procedure Laterality Date  . Abdominal hysterectomy    . Cholecystectomy    . Upper gi endoscopy    . Colonoscopy    . Knee arthroscopy Left   . Nasal septum surgery    . Breast lumpectomy with needle localization and axillary sentinel lymph node bx Right 07/22/2012    Procedure: BREAST LUMPECTOMY WITH NEEDLE LOCALIZATION AND AXILLARY SENTINEL LYMPH NODE BX;  Surgeon: Almond Lint, MD;  Location: [facility] OR;  Service: General;  Laterality: Right;  . Portacath placement Left 08/25/2012    Procedure: INSERTION PORT-A-CATH;  Surgeon: Almond Lint, MD;  Location: [facility];  Service: General;  Laterality: Left;    REVIEW OF SYSTEMS:  Pertinent items are noted in HPI.   HEALTH MAINTENANCE:   PHYSICAL EXAMINATION: Blood pressure 131/70, pulse 74, temperature 97.8 F (36.6 C), temperature  source Oral, resp. rate 20, height 5\' 3"  (1.6 m), weight 158 lb 14.4 oz (72.077 kg). Body mass index is 28.16 kg/(m^2). ECOG PERFORMANCE STATUS: 0 - Asymptomatic   well-developed older his female in no acute distress HEENT exam EOMI PERRLA sclerae anicteric no conjunctival pallor oral mucosa is moist neck is supple lungs are clear to auscultation cardiovascular is regular rate rhythm abdomen is soft nontender nondistended bowel sounds are present no HSM extremities no edema neuro patient's alert oriented otherwise nonfocal right lumpectomy site looks well-healed no skin changes  no erythema no nodularity.   LABORATORY DATA: Lab Results  Component Value Date   WBC 30.5* 09/20/2012   HGB 12.2 09/20/2012   HCT 36.5 09/20/2012   MCV 92.9 09/20/2012   PLT 259 09/20/2012      Chemistry      Component Value Date/Time   NA 139 09/17/2012 0917   NA 135 09/03/2012 0430   K 4.1 09/17/2012 0917   K 4.1 09/03/2012 0430   CL 109* 09/17/2012 0917   CL 104 09/03/2012 0430   CO2 22 09/17/2012 0917   CO2 22 09/03/2012 0430   BUN 16.7 09/17/2012 0917   BUN 12 09/03/2012 0430   CREATININE 0.7 09/17/2012 0917   CREATININE 0.91 09/03/2012 0430      Component Value Date/Time   CALCIUM 9.3 09/17/2012 0917   CALCIUM 8.2* 09/03/2012 0430   ALKPHOS 48 09/17/2012 0917   ALKPHOS 51 09/02/2012 0650   AST 30 09/17/2012 0917   AST 25 09/02/2012 0650   ALT 23 09/17/2012 0917   ALT 20 09/02/2012 0650   BILITOT 0.57 09/17/2012 0917   BILITOT 1.3* 09/02/2012 0650     ADDITIONAL INFORMATION: 1. A sample (block 1A) was sent to [facility] for Oncotype testing. The patient's recurrence score is 29. Those patients who had a recurrence score of 29 had an average rate of distant recurrence of 19%. (JBK:caf 08/06/12) Pecola Leisure MD Pathologist, Electronic Signature ( Signed 08/06/2012) 1. CHROMOGENIC IN-SITU HYBRIDIZATION Results: HER-2/NEU BY C[facility] - NO AMPLIFICATION OF HER-2 DETECTED. RESULT RATIO OF HER2: CEP 17 SIGNALS 0.94 AVERAGE HER2  COPY NUMBER PER CELL 2.20 REFERENCE RANGE NEGATIVE HER2/Chr17 Ratio <2.0 and Average HER2 copy number <4.0 EQUIVOCAL HER2/Chr17 Ratio <2.0 and Average HER2 copy number 4.0 and <6.0 POSITIVE HER2/Chr17 Ratio >=2.0 and/or Average HER2 copy number >=6.0I Jimmy Picket MD Pathologist, Electronic Signature ( Signed 07/30/2012) FINAL DIAGNOSIS Diagnosis 1. Breast, lumpectomy, right 1 of 4 FINAL for Shelia Obrien, Shelia Obrien [phone]) Diagnosis(continued) - INVASIVE LOBULAR CARCINOMA, GRADE II/III, SPANNING 1.7 CM. - LOBULAR CARCINOMA IN SITU. - PERINEURAL INVASION IS IDENTIFIED. - THE SURGICAL RESECTION MARGINS ARE NEGATIVE FOR CARCINOMA. - SEE ONCOLOGY TABLE BELOW. 2. Lymph node, sentinel, biopsy, #1 right axilla - THERE IS NO EVIDENCE OF CARCINOMA IN 1 OF 1 LYMPH NODE (0/1). 3. Lymph node, sentinel, biopsy, #2 right axilla - THERE IS NO EVIDENCE OF CARCINOMA IN 1 OF 1 LYMPH NODE (0/1). 4. Lymph node, sentinel, biopsy, #3, right axilla - THERE IS NO EVIDENCE OF CARCINOMA IN 1 OF 1 LYMPH NODE (0/1). 5. Lymph node, sentinel, biopsy, #4, right axilla - THERE IS NO EVIDENCE OF CARCINOMA IN 1 OF 1 LYMPH NODE (0/1). Microscopic Comment 1. BREAST, INVASIVE TUMOR, WITH LYMPH NODE SAMPLING Specimen, including laterality: Right breast. Procedure: Lumpectomy. Grade: II. Tubule formation: 3. Nuclear pleomorphism: 2. Mitotic: 1. Tumor size (gross measurement): 1.7 cm. Margins: Invasive, distance to closest margin: 0.5 cm to the anterior and superior margins (gross measurements). Lymphovascular invasion: Not identified. Ductal carcinoma in situ: Not identified. Tumor focality: Unifocal. Treatment effect: N/A. Extent of tumor: Confined to breast parenchyma. Lymph nodes: # examined: 4. Lymph nodes with metastasis: 0. Breast prognostic profile: Case [id number]. Estrogen receptor: 93%, strong staining intensity. Progesterone receptor: 7%, strong staining intensity. HER-2/neu: No amplification was  detected. The ratio was 1.04. HER-2/neu by CISH will be repeated on the current case and the results reported separately. Ki-67: 12%. Non-neoplastic breast: Healing biopsy site. TNM: pT1c, pN0. Comments: Immunohistochemical stains performed on parts 2-5 fail  to highlight the presence of cytokeratin positive tumor cells. (JBK:eps 07/27/12) JOSHUA  RADIOGRAPHIC STUDIES:  No results found.  ASSESSMENT: 72 year old female with  #1 new diagnosis of invasive lobular carcinoma measuring 1.7 cm node negative ER positive PR positive HER-2/neu negative with Ki-67 low. Patient had Oncotype DX testing performed which showed an intermediate risk category recurrence score. We discussed chemotherapy adjuvantly for 4 cycles. We discussed Taxotere and Cytoxan every 21 days for a total of 4 cycles. Side effects of treatment were discussed very clearly.  #2 today patient will proceed with cycle 2 of her chemotherapy. She is receiving Taxotere and Cytoxan. She understands the side effects and benefits for it.  #3 she was given a prescription for a cranial prosthesis. She has all of her antiemetics. She was taught how to take these.   PLAN:  #1 patient will proceed with scheduled IV fluids today.  #2 she'll be seen back on Friday for followup and blood work.  #3 she knows to call me with any problems.  All questions were answered. The patient knows to call the clinic with any problems, questions or concerns. We can certainly see the patient much sooner if necessary.  I spent 25 minutes counseling the patient face to face. The total time spent in the appointment was 30 minutes.    Drue Second, MD Medical/Oncology [facility] [phone] (beeper) [phone] (Office)

## 2012-09-20 NOTE — Progress Notes (Signed)
OFFICE PROGRESS NOTE  CC  Shelia Kayser, MD [address] [address] Dr. Almond Lint  Dr. 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   STAGE:  Cancer of lower-inner quadrant of female breast  Primary site: Breast (Right)  Staging method: AJCC 7th Edition  Clinical: Stage IA (T1c, N0, cM0)  Summary: Stage IA (T1c, N0, cM0)   PRIOR THERAPY: #1 patient originally presented at [facility] with a mammogram that showed architectural distortion. Ultrasound showed a 1.2 cm mass in the 4:00 position of the right breast. MRI confirmed a solitary lesion measuring 1.6 cm up. 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  #2 patient is status post lumpectomy with sentinel lymph node biopsy the final pathology revealed a 1.7 cm invasive lobular carcinoma. Tumor was ER positive PR positive HER-2/neu negative with a Ki-67 of 12% 4 sentinel nodes were negative for metastatic disease.  #3 patient had Oncotype DX testing performed her recurrence score was 29 giving her a 19% risk of distant recurrence with adjuvant hormonal therapy only. Patient and I and her family discussed her Oncotype DX testing this puts her in the intermediate risk category. We discussed side effects of chemotherapy types of chemotherapy. I would recommend Taxotere and Cytoxan every 3 weeks for 4 cycles. We can certainly begin this as soon as the Port-A-Cath is placed.  #4 patient had her first cycle of chemotherapy on 08/28/2011. Unfortunately she did develop significant side effects that did require hospitalization. Because of this I am going to plan on using her doses of Taxotere as well as Cytoxan 2 ideal BSA. I have explained this rationale to the patient as well as her daughter who accompanies her today.  CURRENT THERAPY:adjuvant chemotherapy  consisting of cycle 2 Taxotere Cytoxan q. 21 days   The doses will be based on ideal BSA  INTERVAL HISTORY: Shelia Obrien 72 y.o. female returns for followup visit today. Overall she's doing well. She has taken her dexamethasone as scheduled. She is denying any nausea or vomiting. She has no fevers chills or night sweats. Her Port-A-Cath site looks good. She has no aches pains no bleeding problems. With her first cycle she did require hospitalization. Due to this we will reduce her doses by reducing her BSA 2 ideal BSA. Remainder of the 10 point review of systems is negative.  MEDICAL HISTORY: Past Medical History  Diagnosis Date  . Bronchitis   . Thyroid disease   . Acid reflux disease   . High cholesterol   . Breast cancer   . Seasonal allergies   . Hypothyroidism   . Leaking of urine     Dribbles if coughs. Pt wears pad  . Epistaxis     went to [facility] ER  . Shortness of breath   . PONV (postoperative nausea and vomiting)   . Wears glasses   . Arthritis     ALLERGIES:  has No Known Allergies.  MEDICATIONS:  Current Outpatient Prescriptions  Medication Sig Dispense Refill  . bifidobacterium infantis (ALIGN) capsule Take 1 capsule by mouth daily.      . cholecalciferol (VITAMIN D) 1000 UNITS tablet Take 5,000 Units by mouth once a week. On Wednesdays      . dexamethasone (DECADRON) 4 MG tablet Take 2 tablets (8 mg total) by mouth 2 (two) times daily  with a meal. Take two times a day the day before Taxotere. Then take two times a day starting the day after chemo for 3 days.  30 tablet  1  . levothyroxine (SYNTHROID, LEVOTHROID) 75 MCG tablet Take 75 mcg by mouth every morning.       . lidocaine-prilocaine (EMLA) cream       . metoprolol succinate (TOPROL-XL) 25 MG 24 hr tablet Take 1 tablet (25 mg total) by mouth daily.  30 tablet  3  . ondansetron (ZOFRAN) 8 MG tablet Take 8 mg by mouth every 12 (twelve) hours as needed for nausea. Take two times a day starting the day after  chemo for 3 days. Then take two times a day as needed for nausea or vomiting.      . pantoprazole (PROTONIX) 40 MG tablet Take 40 mg by mouth every morning.       . rosuvastatin (CRESTOR) 10 MG tablet Take 10 mg by mouth every evening.       . valACYclovir (VALTREX) 1000 MG tablet       . b complex vitamins tablet Take 1 tablet by mouth every morning.       . gabapentin (NEURONTIN) 100 MG capsule Take 100-300 mg by mouth daily as needed (for pain). Nerve pain      . HYDROcodone-acetaminophen (NORCO/VICODIN) 5-325 MG per tablet Take 1 tablet by mouth every 6 (six) hours as needed for pain.  30 tablet  0  . loperamide (IMODIUM) 2 MG capsule Take 2 mg by mouth 4 (four) times daily as needed for diarrhea or loose stools.      Marland Kitchen loratadine (CLARITIN) 10 MG tablet Take 10 mg by mouth every morning.      Marland Kitchen LORazepam (ATIVAN) 0.5 MG tablet Take 1 tablet (0.5 mg total) by mouth every 6 (six) hours as needed (Nausea or vomiting).  30 tablet  0  . oxyCODONE-acetaminophen (PERCOCET/ROXICET) 5-325 MG per tablet       . prochlorperazine (COMPAZINE) 10 MG tablet        No current facility-administered medications for this visit.    SURGICAL HISTORY:  Past Surgical History  Procedure Laterality Date  . Abdominal hysterectomy    . Cholecystectomy    . Upper gi endoscopy    . Colonoscopy    . Knee arthroscopy Left   . Nasal septum surgery    . Breast lumpectomy with needle localization and axillary sentinel lymph node bx Right 07/22/2012    Procedure: BREAST LUMPECTOMY WITH NEEDLE LOCALIZATION AND AXILLARY SENTINEL LYMPH NODE BX;  Surgeon: Almond Lint, MD;  Location: [facility] OR;  Service: General;  Laterality: Right;  . Portacath placement Left 08/25/2012    Procedure: INSERTION PORT-A-CATH;  Surgeon: Almond Lint, MD;  Location: [facility];  Service: General;  Laterality: Left;    REVIEW OF SYSTEMS:  Pertinent items are noted in HPI.   HEALTH MAINTENANCE:   PHYSICAL EXAMINATION: Blood  pressure 114/67, pulse 91, temperature 98.2 F (36.8 C), temperature source Oral, resp. rate 20, height 5\' 3"  (1.6 m), weight 153 lb 14.4 oz (69.809 kg). Body mass index is 27.27 kg/(m^2). ECOG PERFORMANCE STATUS: 0 - Asymptomatic   well-developed older his female in no acute distress HEENT exam EOMI PERRLA sclerae anicteric no conjunctival pallor oral mucosa is moist neck is supple lungs are clear to auscultation cardiovascular is regular rate rhythm abdomen is soft nontender nondistended bowel sounds are present no HSM extremities no edema neuro patient's alert oriented otherwise  nonfocal right lumpectomy site looks well-healed no skin changes no erythema no nodularity.   LABORATORY DATA: Lab Results  Component Value Date   WBC 13.1* 09/17/2012   HGB 13.3 09/17/2012   HCT 39.5 09/17/2012   MCV 91.9 09/17/2012   PLT 388 09/17/2012      Chemistry      Component Value Date/Time   NA 139 09/17/2012 0917   NA 135 09/03/2012 0430   K 4.1 09/17/2012 0917   K 4.1 09/03/2012 0430   CL 109* 09/17/2012 0917   CL 104 09/03/2012 0430   CO2 22 09/17/2012 0917   CO2 22 09/03/2012 0430   BUN 16.7 09/17/2012 0917   BUN 12 09/03/2012 0430   CREATININE 0.7 09/17/2012 0917   CREATININE 0.91 09/03/2012 0430      Component Value Date/Time   CALCIUM 9.3 09/17/2012 0917   CALCIUM 8.2* 09/03/2012 0430   ALKPHOS 48 09/17/2012 0917   ALKPHOS 51 09/02/2012 0650   AST 30 09/17/2012 0917   AST 25 09/02/2012 0650   ALT 23 09/17/2012 0917   ALT 20 09/02/2012 0650   BILITOT 0.57 09/17/2012 0917   BILITOT 1.3* 09/02/2012 0650     ADDITIONAL INFORMATION: 1. A sample (block 1A) was sent to [facility] for Oncotype testing. The patient's recurrence score is 29. Those patients who had a recurrence score of 29 had an average rate of distant recurrence of 19%. (JBK:caf 08/06/12) Pecola Leisure MD Pathologist, Electronic Signature ( Signed 08/06/2012) 1. CHROMOGENIC IN-SITU HYBRIDIZATION Results: HER-2/NEU BY CISH - NO AMPLIFICATION OF HER-2  DETECTED. RESULT RATIO OF HER2: CEP 17 SIGNALS 0.94 AVERAGE HER2 COPY NUMBER PER CELL 2.20 REFERENCE RANGE NEGATIVE HER2/Chr17 Ratio <2.0 and Average HER2 copy number <4.0 EQUIVOCAL HER2/Chr17 Ratio <2.0 and Average HER2 copy number 4.0 and <6.0 POSITIVE HER2/Chr17 Ratio >=2.0 and/or Average HER2 copy number >=6.0I Jimmy Picket MD Pathologist, Electronic Signature ( Signed 07/30/2012) FINAL DIAGNOSIS Diagnosis 1. Breast, lumpectomy, right 1 of 4 FINAL for Shelia, Obrien D [phone]) Diagnosis(continued) - INVASIVE LOBULAR CARCINOMA, GRADE II/III, SPANNING 1.7 CM. - LOBULAR CARCINOMA IN SITU. - PERINEURAL INVASION IS IDENTIFIED. - THE SURGICAL RESECTION MARGINS ARE NEGATIVE FOR CARCINOMA. - SEE ONCOLOGY TABLE BELOW. 2. Lymph node, sentinel, biopsy, #1 right axilla - THERE IS NO EVIDENCE OF CARCINOMA IN 1 OF 1 LYMPH NODE (0/1). 3. Lymph node, sentinel, biopsy, #2 right axilla - THERE IS NO EVIDENCE OF CARCINOMA IN 1 OF 1 LYMPH NODE (0/1). 4. Lymph node, sentinel, biopsy, #3, right axilla - THERE IS NO EVIDENCE OF CARCINOMA IN 1 OF 1 LYMPH NODE (0/1). 5. Lymph node, sentinel, biopsy, #4, right axilla - THERE IS NO EVIDENCE OF CARCINOMA IN 1 OF 1 LYMPH NODE (0/1). Microscopic Comment 1. BREAST, INVASIVE TUMOR, WITH LYMPH NODE SAMPLING Specimen, including laterality: Right breast. Procedure: Lumpectomy. Grade: II. Tubule formation: 3. Nuclear pleomorphism: 2. Mitotic: 1. Tumor size (gross measurement): 1.7 cm. Margins: Invasive, distance to closest margin: 0.5 cm to the anterior and superior margins (gross measurements). Lymphovascular invasion: Not identified. Ductal carcinoma in situ: Not identified. Tumor focality: Unifocal. Treatment effect: N/A. Extent of tumor: Confined to breast parenchyma. Lymph nodes: # examined: 4. Lymph nodes with metastasis: 0. Breast prognostic profile: Case [id number]. Estrogen receptor: 93%, strong staining intensity. Progesterone  receptor: 7%, strong staining intensity. HER-2/neu: No amplification was detected. The ratio was 1.04. HER-2/neu by CISH will be repeated on the current case and the results reported separately. Ki-67: 12%. Non-neoplastic breast: Healing biopsy site. TNM: pT1c,  pN0. Comments: Immunohistochemical stains performed on parts 2-5 fail to highlight the presence of cytokeratin positive tumor cells. (JBK:eps 07/27/12) JOSHUA  RADIOGRAPHIC STUDIES:  No results found.  ASSESSMENT: 72 year old female with  #1 new diagnosis of invasive lobular carcinoma measuring 1.7 cm node negative ER positive PR positive HER-2/neu negative with Ki-67 low. Patient had Oncotype DX testing performed which showed an intermediate risk category recurrence score. We discussed chemotherapy adjuvantly for 4 cycles. We discussed Taxotere and Cytoxan every 21 days for a total of 4 cycles. Side effects of treatment were discussed very clearly.  #2 today patient will proceed with cycle 2 of her chemotherapy. She is receiving Taxotere and Cytoxan. She understands the side effects and benefits for it.  #3 she was given a prescription for a cranial prosthesis. She has all of her antiemetics. She was taught how to take these.   PLAN:   #1 proceed with cycle 2 day 1 of Taxotere and Cytoxan. She will return tomorrow for Neulasta injection.  #2 she will receive IV fluids today and tomorrow. I will also plan on seeing her back in one week's time with IV fluids I will also see her on Monday, June 9 for followup and IV fluids.  #2 I will see her back in one week's time for labs and followup.  All questions were answered. The patient knows to call the clinic with any problems, questions or concerns. We can certainly see the patient much sooner if necessary.  I spent 25 minutes counseling the patient face to face. The total time spent in the appointment was 30 minutes.    Drue Second, MD Medical/Oncology [facility] [phone] (beeper) [phone] (Office)

## 2012-09-20 NOTE — Patient Instructions (Addendum)
Proceed with IVF today  I will see you back on 6/13 for follow up

## 2012-09-20 NOTE — Patient Instructions (Addendum)
Dehydration, Adult Dehydration is when you lose more fluids from the body than you take in. Vital organs like the kidneys, brain, and heart cannot function without a proper amount of fluids and salt. Any loss of fluids from the body can cause dehydration.  CAUSES   Vomiting.  Diarrhea.  Excessive sweating.  Excessive urine output.  Fever. SYMPTOMS  Mild dehydration  Thirst.  Dry lips.  Slightly dry mouth. Moderate dehydration  Very dry mouth.  Sunken eyes.  Skin does not bounce back quickly when lightly pinched and released.  Dark urine and decreased urine production.  Decreased tear production.  Headache. Severe dehydration  Very dry mouth.  Extreme thirst.  Rapid, weak pulse (more than 100 beats per minute at rest).  Cold hands and feet.  Not able to sweat in spite of heat and temperature.  Rapid breathing.  Blue lips.  Confusion and lethargy.  Difficulty being awakened.  Minimal urine production.  No tears. DIAGNOSIS  Your caregiver will diagnose dehydration based on your symptoms and your exam. Blood and urine tests will help confirm the diagnosis. The diagnostic evaluation should also identify the cause of dehydration. TREATMENT  Treatment of mild or moderate dehydration can often be done at home by increasing the amount of fluids that you drink. It is best to drink small amounts of fluid more often. Drinking too much at one time can make vomiting worse. Refer to the home care instructions below. Severe dehydration needs to be treated at the hospital where you will probably be given intravenous (IV) fluids that contain water and electrolytes. HOME CARE INSTRUCTIONS   Ask your caregiver about specific rehydration instructions.  Drink enough fluids to keep your urine clear or pale yellow.  Drink small amounts frequently if you have nausea and vomiting.  Eat as you normally do.  Avoid:  Foods or drinks high in sugar.  Carbonated  drinks.  Juice.  Extremely hot or cold fluids.  Drinks with caffeine.  Fatty, greasy foods.  Alcohol.  Tobacco.  Overeating.  Gelatin desserts.  Wash your hands well to avoid spreading bacteria and viruses.  Only take over-the-counter or prescription medicines for pain, discomfort, or fever as directed by your caregiver.  Ask your caregiver if you should continue all prescribed and over-the-counter medicines.  Keep all follow-up appointments with your caregiver. SEEK MEDICAL CARE IF:  You have abdominal pain and it increases or stays in one area (localizes).  You have a rash, stiff neck, or severe headache.  You are irritable, sleepy, or difficult to awaken.  You are weak, dizzy, or extremely thirsty. SEEK IMMEDIATE MEDICAL CARE IF:   You are unable to keep fluids down or you get worse despite treatment.  You have frequent episodes of vomiting or diarrhea.  You have blood or green matter (bile) in your vomit.  You have blood in your stool or your stool looks black and tarry.  You have not urinated in 6 to 8 hours, or you have only urinated a small amount of very dark urine.  You have a fever.  You faint. MAKE SURE YOU:   Understand these instructions.  Will watch your condition.  Will get help right away if you are not doing well or get worse. Document Released: 03/31/2005 Document Revised: 06/23/2011 Document Reviewed: 11/18/2010 Exit[employer] Patient Information 2014 [employer].  

## 2012-09-22 ENCOUNTER — Other Ambulatory Visit: Payer: Self-pay | Admitting: Emergency Medicine

## 2012-09-22 ENCOUNTER — Ambulatory Visit (HOSPITAL_BASED_OUTPATIENT_CLINIC_OR_DEPARTMENT_OTHER): Payer: Medicare Other | Admitting: Adult Health

## 2012-09-22 ENCOUNTER — Encounter: Payer: Self-pay | Admitting: Adult Health

## 2012-09-22 ENCOUNTER — Encounter (HOSPITAL_COMMUNITY): Payer: Self-pay

## 2012-09-22 ENCOUNTER — Other Ambulatory Visit (HOSPITAL_BASED_OUTPATIENT_CLINIC_OR_DEPARTMENT_OTHER): Payer: Medicare Other | Admitting: Lab

## 2012-09-22 ENCOUNTER — Inpatient Hospital Stay (HOSPITAL_COMMUNITY): Payer: Medicare Other

## 2012-09-22 ENCOUNTER — Inpatient Hospital Stay (HOSPITAL_COMMUNITY)
Admission: AD | Admit: 2012-09-22 | Discharge: 2012-09-26 | DRG: 810 | Disposition: A | Discharge status: Home / Self Care | Payer: Medicare Other | Source: Ambulatory Visit | Attending: Internal Medicine | Admitting: Internal Medicine

## 2012-09-22 VITALS — BP 145/88 | HR 87 | Temp 98.1°F | Resp 20 | Ht 63.0 in | Wt 152.7 lb

## 2012-09-22 DIAGNOSIS — C50319 Malignant neoplasm of lower-inner quadrant of unspecified female breast: Secondary | ICD-10-CM

## 2012-09-22 DIAGNOSIS — R778 Other specified abnormalities of plasma proteins: Secondary | ICD-10-CM

## 2012-09-22 DIAGNOSIS — R5081 Fever presenting with conditions classified elsewhere: Secondary | ICD-10-CM | POA: Diagnosis present

## 2012-09-22 DIAGNOSIS — E785 Hyperlipidemia, unspecified: Secondary | ICD-10-CM

## 2012-09-22 DIAGNOSIS — D696 Thrombocytopenia, unspecified: Secondary | ICD-10-CM

## 2012-09-22 DIAGNOSIS — E876 Hypokalemia: Secondary | ICD-10-CM | POA: Diagnosis present

## 2012-09-22 DIAGNOSIS — K5900 Constipation, unspecified: Secondary | ICD-10-CM

## 2012-09-22 DIAGNOSIS — C50311 Malignant neoplasm of lower-inner quadrant of right female breast: Secondary | ICD-10-CM

## 2012-09-22 DIAGNOSIS — T451X5A Adverse effect of antineoplastic and immunosuppressive drugs, initial encounter: Secondary | ICD-10-CM | POA: Diagnosis present

## 2012-09-22 DIAGNOSIS — C50919 Malignant neoplasm of unspecified site of unspecified female breast: Secondary | ICD-10-CM | POA: Diagnosis present

## 2012-09-22 DIAGNOSIS — D709 Neutropenia, unspecified: Secondary | ICD-10-CM | POA: Diagnosis present

## 2012-09-22 DIAGNOSIS — R509 Fever, unspecified: Secondary | ICD-10-CM

## 2012-09-22 DIAGNOSIS — D702 Other drug-induced agranulocytosis: Principal | ICD-10-CM | POA: Diagnosis present

## 2012-09-22 DIAGNOSIS — E039 Hypothyroidism, unspecified: Secondary | ICD-10-CM

## 2012-09-22 DIAGNOSIS — R55 Syncope and collapse: Secondary | ICD-10-CM

## 2012-09-22 DIAGNOSIS — Z9221 Personal history of antineoplastic chemotherapy: Secondary | ICD-10-CM

## 2012-09-22 DIAGNOSIS — K529 Noninfective gastroenteritis and colitis, unspecified: Secondary | ICD-10-CM

## 2012-09-22 LAB — CBC WITH DIFFERENTIAL/PLATELET
BASO%: 0.5 % (ref 0.0–2.0)
EOS%: 3.3 % (ref 0.0–7.0)
Eosinophils Absolute: 0.1 10*3/uL (ref 0.0–0.5)
LYMPH%: 53.2 % — ABNORMAL HIGH (ref 14.0–49.7)
MCHC: 34.4 g/dL (ref 31.5–36.0)
MCV: 92.1 fL (ref 79.5–101.0)
MONO%: 4.6 % (ref 0.0–14.0)
NEUT#: 0.9 10*3/uL — ABNORMAL LOW (ref 1.5–6.5)
RBC: 4.46 10*6/uL (ref 3.70–5.45)
RDW: 14.5 % (ref 11.2–14.5)

## 2012-09-22 LAB — COMPREHENSIVE METABOLIC PANEL (CC13)
ALT: 22 U/L (ref 0–55)
AST: 23 U/L (ref 5–34)
Albumin: 3.4 g/dL — ABNORMAL LOW (ref 3.5–5.0)
Alkaline Phosphatase: 70 U/L (ref 40–150)
Glucose: 113 mg/dl — ABNORMAL HIGH (ref 70–99)
Potassium: 4.5 mEq/L (ref 3.5–5.1)
Sodium: 134 mEq/L — ABNORMAL LOW (ref 136–145)
Total Bilirubin: 2.6 mg/dL — ABNORMAL HIGH (ref 0.20–1.20)
Total Protein: 6.5 g/dL (ref 6.4–8.3)

## 2012-09-22 LAB — URINALYSIS, ROUTINE W REFLEX MICROSCOPIC
Glucose, UA: NEGATIVE mg/dL
Leukocytes, UA: NEGATIVE
Specific Gravity, Urine: 1.023 (ref 1.005–1.030)
pH: 6.5 (ref 5.0–8.0)

## 2012-09-22 IMAGING — CR DG ABDOMEN ACUTE W/ 1V CHEST
3 series · 3 of 3 positions shown · non-contrast
Comparison: CT abdomen and pelvis [DATE] and earlier.

CLINICAL DATA: 72-year-old female with nausea vomiting abdominal
pain.  Decreased bowel sounds.

ACUTE ABDOMEN SERIES (ABDOMEN 2 VIEW & CHEST 1 VIEW)

[w chest pa]
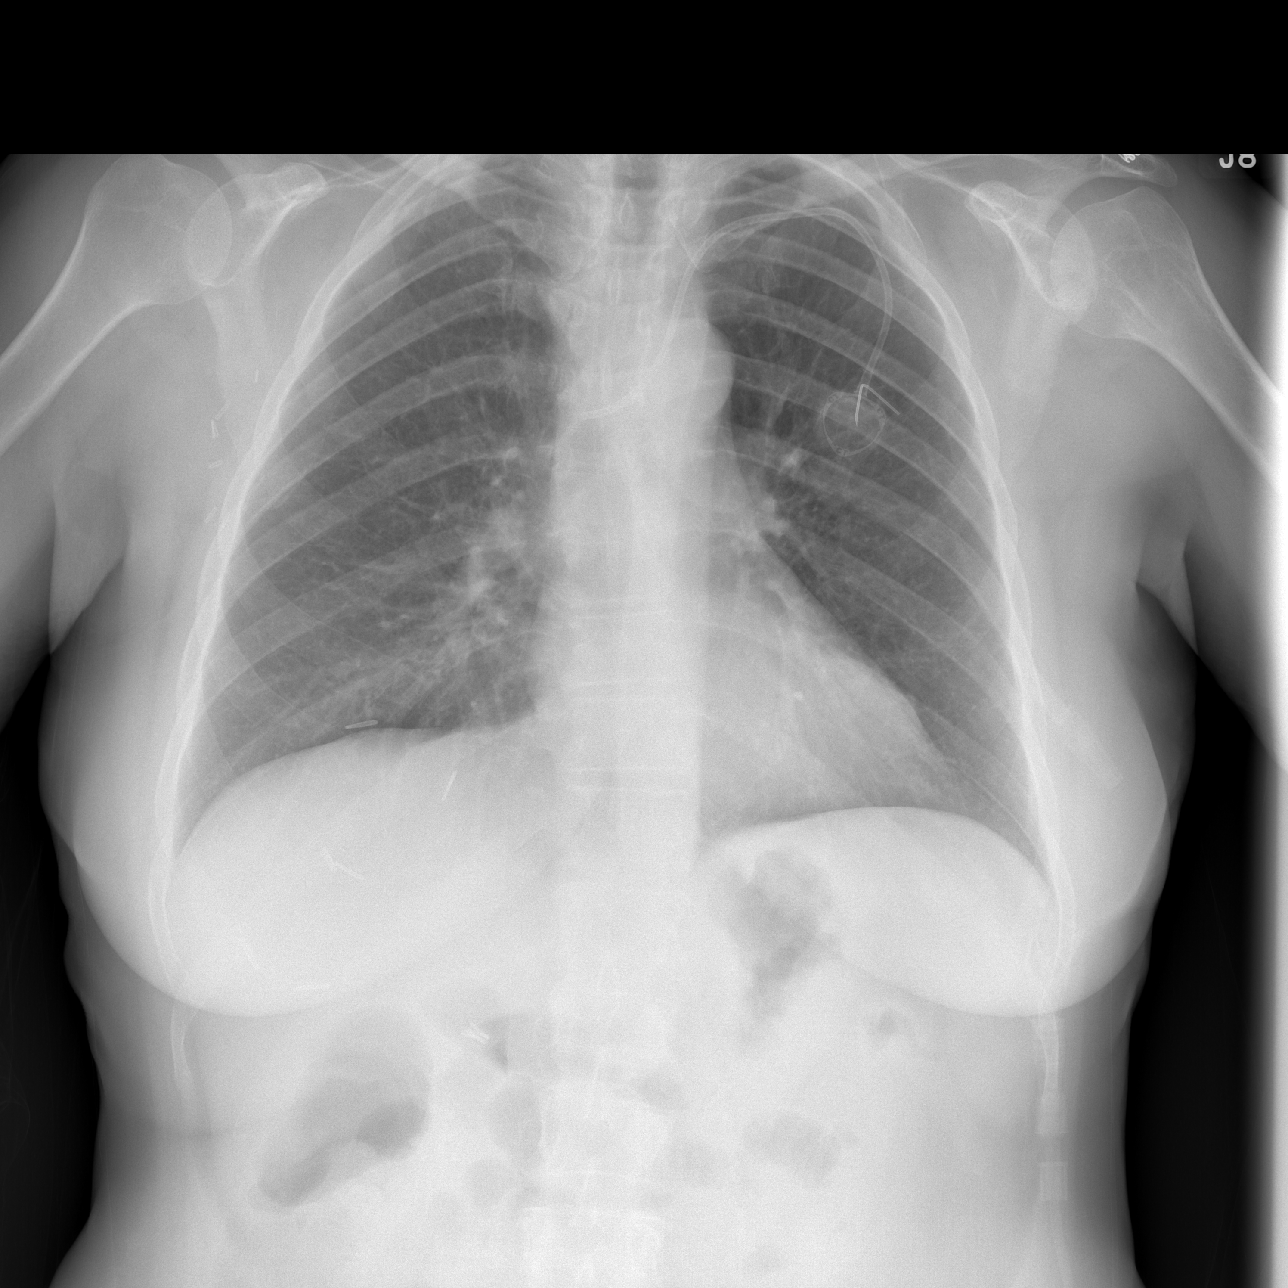

[w abdomen upright *]
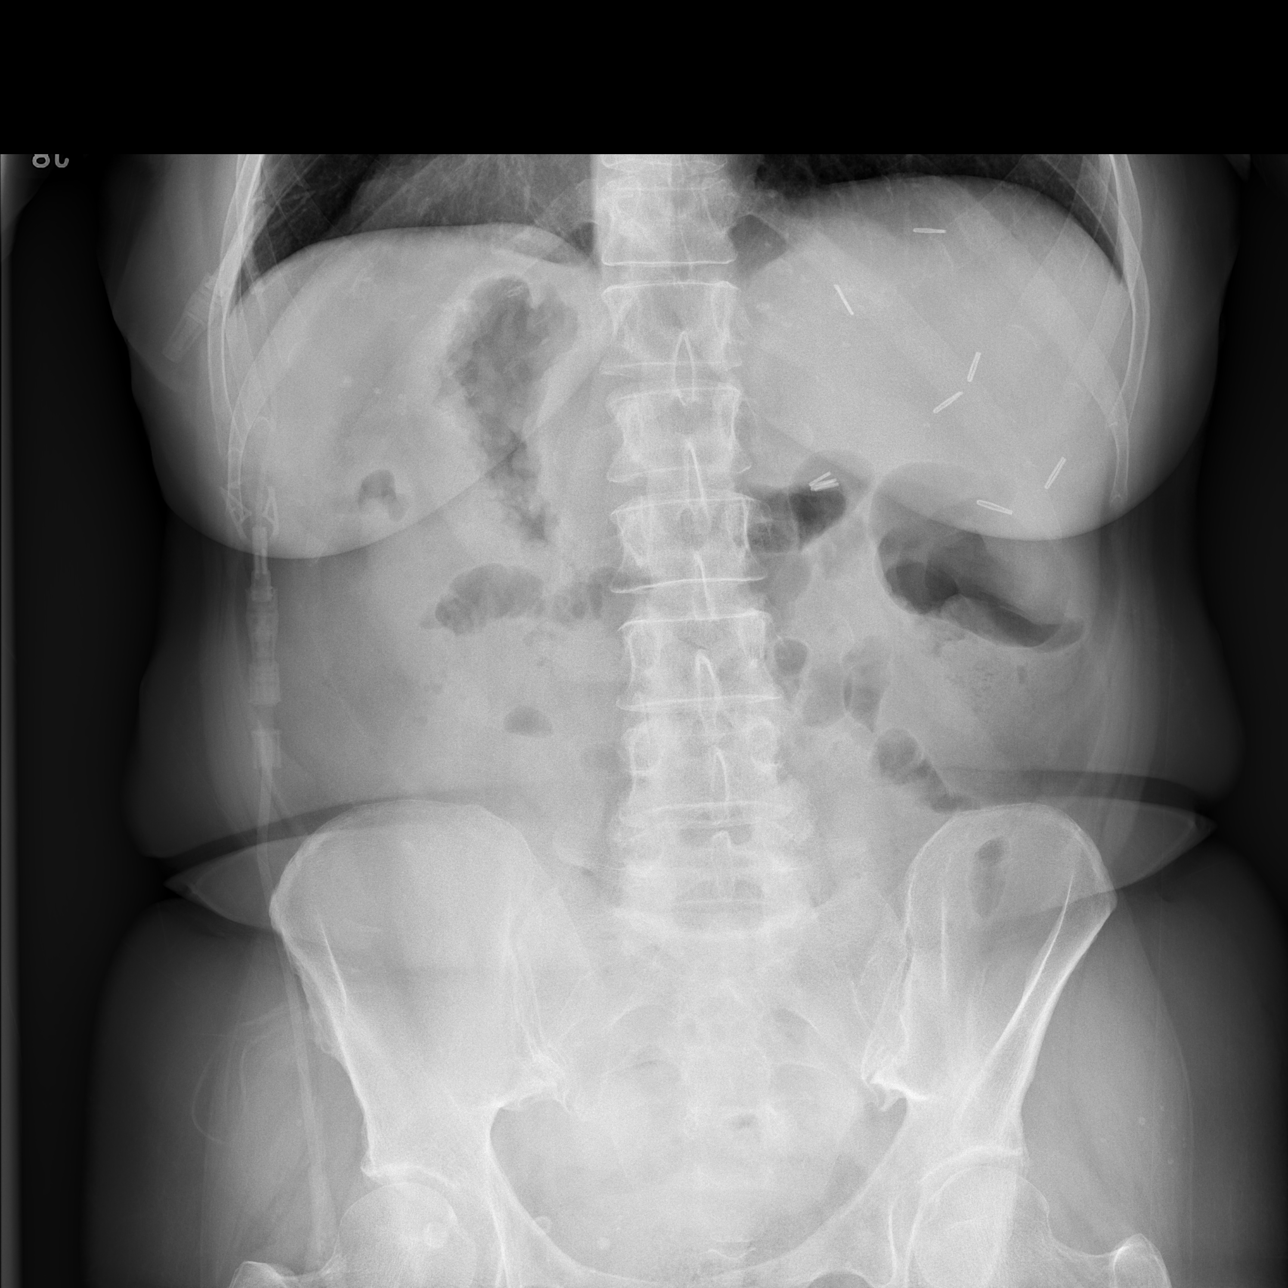

[t abdomen supine]
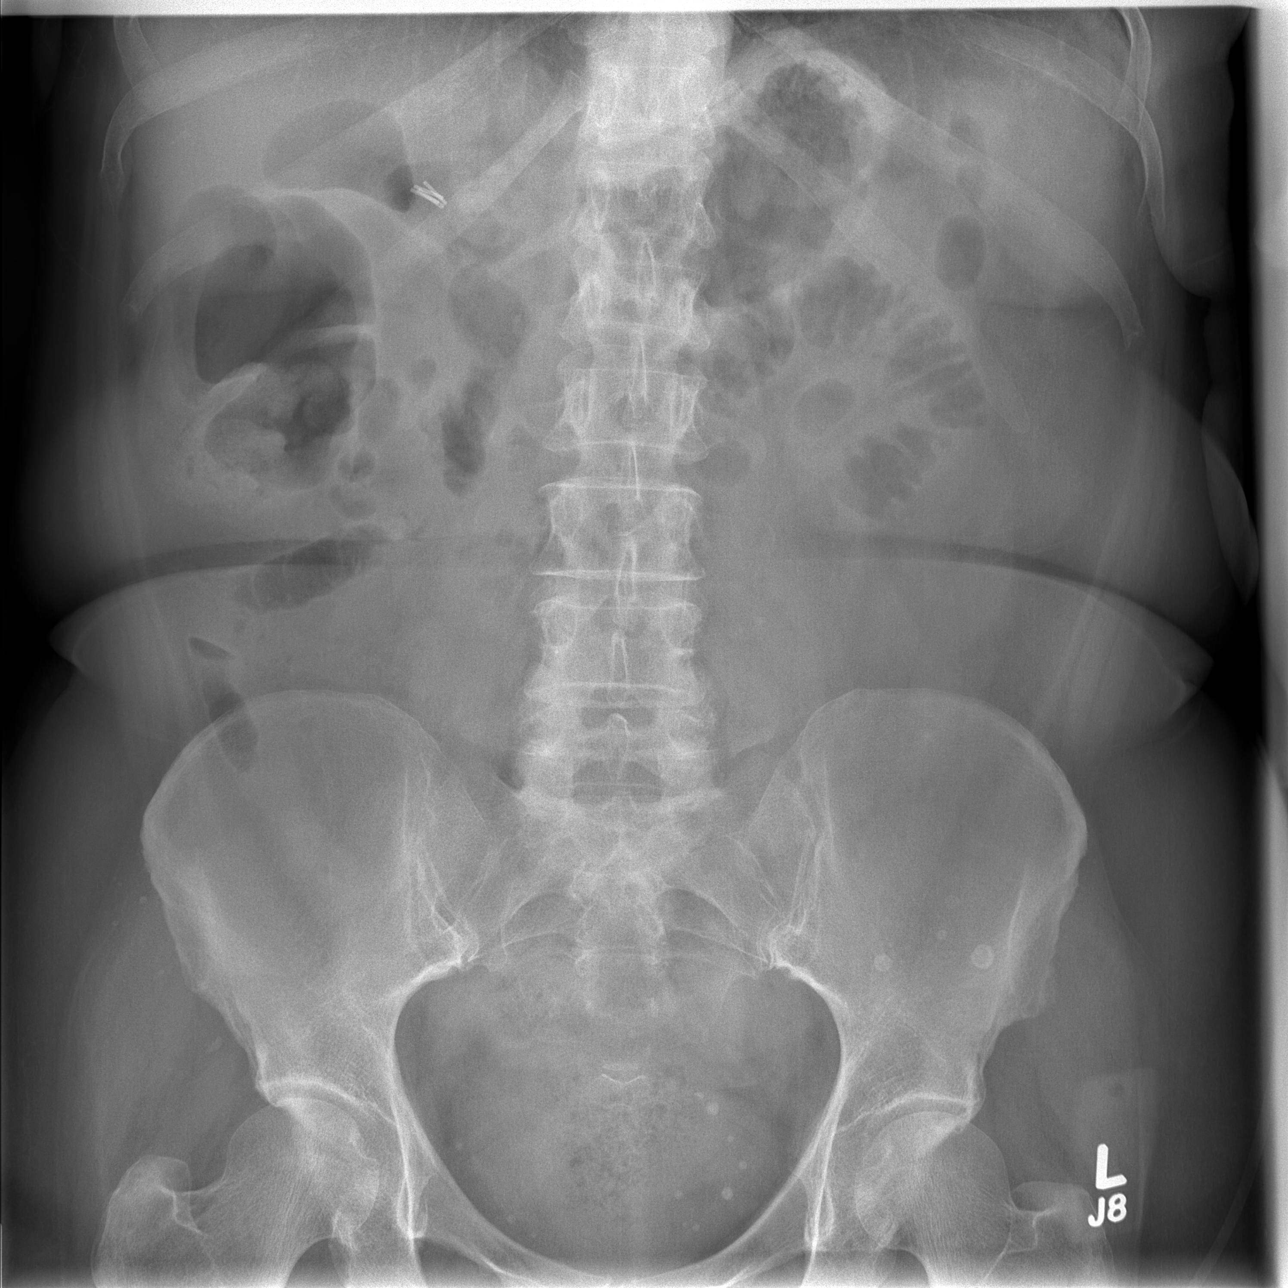

[3 of 3 positions shown; findings below may reference images not displayed]

FINDINGS: Stable left chest Port-A-Cath.  Stable postoperative
changes to the right axilla and chest wall.  Right upper quadrant
surgical clips also noted.  Stable lung volumes.  No pneumothorax
or pneumoperitoneum.  Cardiac size and mediastinal contours are
within normal limits.  No acute pulmonary opacity.

Paucity of distal bowel gas.  Gas is seen in the mid abdominal
small and large bowel loops.  No dilated bowel loops are
identified.  Stable pelvic phleboliths and flank injection
granulomas.  Abdominal and pelvic visceral contours are within
normal limits. No acute osseous abnormality identified.
IMPRESSION: 1. Nonobstructed bowel gas pattern, no free air.
2. No acute cardiopulmonary abnormality.

## 2012-09-22 MED ORDER — ONDANSETRON HCL 4 MG PO TABS: 4.0000 mg | ORAL_TABLET | Freq: Four times a day (QID) | ORAL | Status: DC | PRN

## 2012-09-22 MED ORDER — HYDROCODONE-ACETAMINOPHEN 5-325 MG PO TABS
1.0000 | ORAL_TABLET | ORAL | Status: DC | PRN
  Administered 2012-09-23: 1 via ORAL
  Administered 2012-09-24: 2 via ORAL
  Filled 2012-09-22: qty 2
  Filled 2012-09-22: qty 1

## 2012-09-22 MED ORDER — ONDANSETRON 8 MG/50ML IVPB (CHCC)
8.0000 mg | Freq: Once | INTRAVENOUS | Status: AC
  Administered 2012-09-22: 8 mg via INTRAVENOUS

## 2012-09-22 MED ORDER — GABAPENTIN 100 MG PO CAPS
100.0000 mg | ORAL_CAPSULE | Freq: Every day | ORAL | Status: DC | PRN
  Filled 2012-09-22: qty 3

## 2012-09-22 MED ORDER — ACETAMINOPHEN 650 MG RE SUPP: 650.0000 mg | Freq: Four times a day (QID) | RECTAL | Status: DC | PRN

## 2012-09-22 MED ORDER — BISACODYL 10 MG RE SUPP: 10.0000 mg | Freq: Every day | RECTAL | Status: DC | PRN

## 2012-09-22 MED ORDER — SODIUM CHLORIDE 0.9 % IV SOLN
INTRAVENOUS | Status: DC
  Administered 2012-09-22: 14:00:00 via INTRAVENOUS

## 2012-09-22 MED ORDER — DOCUSATE SODIUM 100 MG PO CAPS
100.0000 mg | ORAL_CAPSULE | Freq: Two times a day (BID) | ORAL | Status: DC
  Administered 2012-09-22: 100 mg via ORAL
  Filled 2012-09-22 (×5): qty 1

## 2012-09-22 MED ORDER — METOPROLOL SUCCINATE ER 25 MG PO TB24
25.0000 mg | ORAL_TABLET | Freq: Every day | ORAL | Status: DC
  Administered 2012-09-23 – 2012-09-26 (×4): 25 mg via ORAL
  Filled 2012-09-22 (×4): qty 1

## 2012-09-22 MED ORDER — ONDANSETRON HCL 4 MG/2ML IJ SOLN: 4.0000 mg | Freq: Four times a day (QID) | INTRAMUSCULAR | Status: DC | PRN

## 2012-09-22 MED ORDER — ENOXAPARIN SODIUM 40 MG/0.4ML ~~LOC~~ SOLN
40.0000 mg | SUBCUTANEOUS | Status: DC
  Administered 2012-09-22 – 2012-09-25 (×4): 40 mg via SUBCUTANEOUS
  Filled 2012-09-22 (×5): qty 0.4

## 2012-09-22 MED ORDER — VANCOMYCIN HCL IN DEXTROSE 1-5 GM/200ML-% IV SOLN
1000.0000 mg | Freq: Two times a day (BID) | INTRAVENOUS | Status: DC
  Administered 2012-09-22 – 2012-09-25 (×6): 1000 mg via INTRAVENOUS
  Filled 2012-09-22 (×6): qty 200

## 2012-09-22 MED ORDER — LEVOTHYROXINE SODIUM 75 MCG PO TABS
75.0000 ug | ORAL_TABLET | Freq: Every day | ORAL | Status: DC
  Administered 2012-09-23 – 2012-09-26 (×4): 75 ug via ORAL
  Filled 2012-09-22 (×5): qty 1

## 2012-09-22 MED ORDER — ACETAMINOPHEN 325 MG PO TABS
650.0000 mg | ORAL_TABLET | Freq: Four times a day (QID) | ORAL | Status: DC | PRN
  Administered 2012-09-22: 650 mg via ORAL
  Filled 2012-09-22: qty 2

## 2012-09-22 MED ORDER — DEXTROSE 5 % IV SOLN
2.0000 g | Freq: Three times a day (TID) | INTRAVENOUS | Status: DC
  Administered 2012-09-22 – 2012-09-26 (×12): 2 g via INTRAVENOUS
  Filled 2012-09-22 (×13): qty 2

## 2012-09-22 MED ORDER — PANTOPRAZOLE SODIUM 40 MG PO TBEC
40.0000 mg | DELAYED_RELEASE_TABLET | Freq: Every morning | ORAL | Status: DC
  Administered 2012-09-23 – 2012-09-26 (×4): 40 mg via ORAL
  Filled 2012-09-22 (×4): qty 1

## 2012-09-22 MED ORDER — LORAZEPAM 0.5 MG PO TABS
0.5000 mg | ORAL_TABLET | Freq: Four times a day (QID) | ORAL | Status: DC | PRN
  Administered 2012-09-23 – 2012-09-24 (×2): 0.5 mg via ORAL
  Filled 2012-09-22 (×2): qty 1

## 2012-09-22 MED ORDER — SODIUM CHLORIDE 0.9 % IV SOLN
INTRAVENOUS | Status: DC
  Administered 2012-09-22: 700 mL via INTRAVENOUS
  Administered 2012-09-23 (×2): via INTRAVENOUS

## 2012-09-22 NOTE — H&P (Signed)
[facility] History and Physical  SEVANNAH MADIA ZOX:[id number] DOB: 1941-01-23 DOA: 09/22/2012  Referring physician: Oncology PCP: [name], MD  Specialists: Dr. Welton Flakes  Chief Complaint: chills/ fever/abd pain  HPI: Shelia Obrien is a 72 y.o. female  Who was sent from Dr. Milta Deiters office with an ANC of 900, rigors and fever of 100.  She was having vomiting x1 and constipation with abd cramps.  She did have a large BM before admission to hospital and cramps have resolved.  No blood in her BM.  She is still "cold".   No current pain or nausea- asking for something to eat. No headache She is currently undergoing chemotherapy of taxotere and cytoxan. Already feeling better after IVF.    Review of Systems: all systems reviewed, negative unless stated above   Past Medical History  Diagnosis Date  . Bronchitis   . Thyroid disease   . Acid reflux disease   . High cholesterol   . Breast cancer   . Seasonal allergies   . Hypothyroidism   . Leaking of urine     Dribbles if coughs. Pt wears pad  . Epistaxis     went to [facility] ER  . Shortness of breath   . PONV (postoperative nausea and vomiting)   . Wears glasses   . Arthritis    Past Surgical History  Procedure Laterality Date  . Abdominal hysterectomy    . Cholecystectomy    . Upper gi endoscopy    . Colonoscopy    . Knee arthroscopy Left   . Nasal septum surgery    . Breast lumpectomy with needle localization and axillary sentinel lymph node bx Right 07/22/2012    Procedure: BREAST LUMPECTOMY WITH NEEDLE LOCALIZATION AND AXILLARY SENTINEL LYMPH NODE BX;  Surgeon: Almond Lint, MD;  Location: [facility] OR;  Service: General;  Laterality: Right;  . Portacath placement Left 08/25/2012    Procedure: INSERTION PORT-A-CATH;  Surgeon: Almond Lint, MD;  Location: [facility];  Service: General;  Laterality: Left;   Social History:  reports that she has never smoked. She has never used smokeless tobacco. She  reports that she does not drink alcohol or use illicit drugs.  No Known Allergies  Family History  Problem Relation Age of Onset  . Prostate cancer Father   . Breast cancer Maternal Aunt   . Lung cancer Maternal Uncle   . Lung cancer Maternal Grandmother   . Kidney cancer Maternal Grandmother    Prior to Admission medications   Medication Sig Start Date End Date Taking? Authorizing Provider  b complex vitamins tablet Take 1 tablet by mouth every morning.     Historical Provider, MD  bifidobacterium infantis (ALIGN) capsule Take 1 capsule by mouth daily.    Historical Provider, MD  cholecalciferol (VITAMIN D) 1000 UNITS tablet Take 5,000 Units by mouth once a week. On Wednesdays    Historical Provider, MD  dexamethasone (DECADRON) 4 MG tablet Take 2 tablets (8 mg total) by mouth 2 (two) times daily with a meal. Take two times a day the day before Taxotere. Then take two times a day starting the day after chemo for 3 days. 08/13/12   Victorino December, MD  gabapentin (NEURONTIN) 100 MG capsule Take 100-300 mg by mouth daily as needed (for pain). Nerve pain    Historical Provider, MD  HYDROcodone-acetaminophen (NORCO/VICODIN) 5-325 MG per tablet Take 1 tablet by mouth every 6 (six) hours as needed for pain. 09/04/12  Richarda Overlie, MD  levothyroxine (SYNTHROID, LEVOTHROID) 75 MCG tablet Take 75 mcg by mouth every morning.     Historical Provider, MD  lidocaine-prilocaine (EMLA) cream  08/13/12   Historical Provider, MD  loperamide (IMODIUM) 2 MG capsule Take 2 mg by mouth 4 (four) times daily as needed for diarrhea or loose stools.    Historical Provider, MD  loratadine (CLARITIN) 10 MG tablet Take 10 mg by mouth every morning.    Historical Provider, MD  LORazepam (ATIVAN) 0.5 MG tablet Take 1 tablet (0.5 mg total) by mouth every 6 (six) hours as needed (Nausea or vomiting). 08/13/12   Victorino December, MD  metoprolol succinate (TOPROL-XL) 25 MG 24 hr tablet Take 1 tablet (25 mg total) by mouth daily.  09/04/12   Richarda Overlie, MD  ondansetron (ZOFRAN) 8 MG tablet Take 8 mg by mouth every 12 (twelve) hours as needed for nausea. Take two times a day starting the day after chemo for 3 days. Then take two times a day as needed for nausea or vomiting. 08/13/12   Victorino December, MD  oxyCODONE-acetaminophen (PERCOCET/ROXICET) 5-325 MG per tablet  08/26/12   Historical Provider, MD  pantoprazole (PROTONIX) 40 MG tablet Take 40 mg by mouth every morning.     Historical Provider, MD  prochlorperazine (COMPAZINE) 10 MG tablet  08/13/12   Historical Provider, MD  rosuvastatin (CRESTOR) 10 MG tablet Take 10 mg by mouth every evening.     Historical Provider, MD  valACYclovir (VALTREX) 1000 MG tablet  08/26/12   Historical Provider, MD   Physical Exam: There were no vitals filed for this visit.   General:  Chronically ill appearing female  Eyes: wnl  ENT: wnl  Neck: supple  Cardiovascular: rrr  Respiratory: clear anterior  Abdomen: decreased Bowel sounds, no tenderness  Skin: no rashes or lesions  Musculoskeletal: moves all 4 ext  Psychiatric: normal mood/affect  Neurologic: CN 2-12 intact  Labs on Admission:  Basic Metabolic Panel:  Recent Labs Lab 09/17/12 0917 09/20/12 1017 09/22/12 1202  NA 139 138 134*  K 4.1 4.1 4.5  CL 109* 106 99  CO2 22 24 24   GLUCOSE 114* 109* 113*  BUN 16.7 19.1 13.4  CREATININE 0.7 0.7 0.8  CALCIUM 9.3 8.7 9.3   Liver Function Tests:  Recent Labs Lab 09/17/12 0917 09/20/12 1017 09/22/12 1202  AST 30 18 23   ALT 23 23 22   ALKPHOS 48 55 70  BILITOT 0.57 2.18* 2.60*  PROT 6.5 5.9* 6.5  ALBUMIN 3.3* 3.2* 3.4*   No results found for this basename: LIPASE, AMYLASE,  in the last 168 hours No results found for this basename: AMMONIA,  in the last 168 hours CBC:  Recent Labs Lab 09/17/12 0917 09/20/12 1017 09/22/12 1202  WBC 13.1* 30.5* 2.5*  NEUTROABS 11.2* 28.9* 0.9*  HGB 13.3 12.2 14.1  HCT 39.5 36.5 41.0  MCV 91.9 92.9 92.1  PLT 388  259 158   Cardiac Enzymes: No results found for this basename: CKTOTAL, CKMB, CKMBINDEX, TROPONINI,  in the last 168 hours  BNP (last 3 results)  Recent Labs  09/02/12 0650  PROBNP 1049.0*   CBG: No results found for this basename: GLUCAP,  in the last 168 hours  Radiological Exams on Admission: No results found.    Assessment/Plan Principal Problem:   Neutropenia, febrile Active Problems:   Cancer of lower-inner quadrant of female breast   Hypothyroidism   Unspecified constipation   1. Febrile neutropenia- pan culture; IV  abx-broad spectrum, monitor CBC  2. Constipation- had large BM just before coming in- check x ray (including lung); vomited x 1 but no more cramps since large BM; start bowel regimine 3. Cancer- Dr. Welton Flakes 4. Hypothyroid- TSH WNL recently and continue home meds    Code Status: full Family Communication: at bedside Disposition Plan: home 2-3 days  Time spent: 70 min  Benjamine Mola Jonnie Truxillo [facility] Pager [phone]  If 7PM-7AM, please contact night-coverage [email or web address] Password [facility] 09/22/2012, 2:55 PM

## 2012-09-22 NOTE — Progress Notes (Signed)
ANTIBIOTIC CONSULT NOTE - INITIAL  Pharmacy Consult for Vancomycin and Cefepime Indication: Febrile neutropenia  No Known Allergies  Patient Measurements: Height: 5\' 3"  (160 cm) Weight: 155 lb 6.8 oz (70.5 kg) IBW/kg (Calculated) : 52.4  Vital Signs: Temp: 98.1 F (36.7 C) (06/11 1513) Temp src: Oral (06/11 1218) BP: 148/62 mmHg (06/11 1513) Pulse Rate: 100 (06/11 1513) Intake/Output from previous day:   Intake/Output from this shift:    Labs:  Recent Labs  09/20/12 1017 09/22/12 1202  WBC 30.5* 2.5*  HGB 12.2 14.1  PLT 259 158  CREATININE 0.7 0.8   Estimated Creatinine Clearance: 58.9 ml/min (by C-G formula based on Cr of 0.8). No results found for this basename: VANCOTROUGH, VANCOPEAK, VANCORANDOM, GENTTROUGH, GENTPEAK, GENTRANDOM, TOBRATROUGH, TOBRAPEAK, TOBRARND, AMIKACINPEAK, AMIKACINTROU, AMIKACIN,  in the last 72 hours   Microbiology: Recent Results (from the past 720 hour(s))  CULTURE, BLOOD (ROUTINE X 2)     Status: None   Collection Time    09/02/12 12:54 AM      Result Value Range Status   Specimen Description BLOOD LEFT HAND   Final   Special Requests BOTTLES DRAWN AEROBIC AND ANAEROBIC 5CC   Final   Culture  Setup Time 09/02/2012 08:53   Final   Culture NO GROWTH 5 DAYS   Final   Report Status 09/08/2012 FINAL   Final  CULTURE, BLOOD (ROUTINE X 2)     Status: None   Collection Time    09/02/12  1:45 AM      Result Value Range Status   Specimen Description BLOOD LEFT CHEST PORT   Final   Special Requests BOTTLES DRAWN AEROBIC AND ANAEROBIC   Final   Culture  Setup Time 09/02/2012 08:54   Final   Culture NO GROWTH 5 DAYS   Final   Report Status 09/08/2012 FINAL   Final  CLOSTRIDIUM DIFFICILE BY PCR     Status: None   Collection Time    09/02/12  4:16 AM      Result Value Range Status   C difficile by pcr NEGATIVE  NEGATIVE Final    Medical History: Past Medical History  Diagnosis Date  . Bronchitis   . Thyroid disease   . Acid reflux  disease   . High cholesterol   . Breast cancer   . Seasonal allergies   . Hypothyroidism   . Leaking of urine     Dribbles if coughs. Pt wears pad  . Epistaxis     went to [facility] ER  . Shortness of breath   . PONV (postoperative nausea and vomiting)   . Wears glasses   . Arthritis     Medications:  Prescriptions prior to admission  Medication Sig Dispense Refill  . acetaminophen (TYLENOL) 500 MG tablet Take 500 mg by mouth every 6 (six) hours as needed for pain.      . bifidobacterium infantis (ALIGN) capsule Take 1 capsule by mouth daily.      . cholecalciferol (VITAMIN D) 1000 UNITS tablet Take 5,000 Units by mouth once a week. On Wednesdays      . dexamethasone (DECADRON) 4 MG tablet Take 2 tablets (8 mg total) by mouth 2 (two) times daily with a meal. Take two times a day the day before Taxotere. Then take two times a day starting the day after chemo for 3 days.  30 tablet  1  . levothyroxine (SYNTHROID, LEVOTHROID) 75 MCG tablet Take 75 mcg by mouth every morning.       Marland Kitchen  lidocaine-prilocaine (EMLA) cream       . loratadine (CLARITIN) 10 MG tablet Take 10 mg by mouth every morning.      Marland Kitchen LORazepam (ATIVAN) 0.5 MG tablet Take 1 tablet (0.5 mg total) by mouth every 6 (six) hours as needed (Nausea or vomiting).  30 tablet  0  . metoprolol succinate (TOPROL-XL) 25 MG 24 hr tablet Take 1 tablet (25 mg total) by mouth daily.  30 tablet  3  . ondansetron (ZOFRAN) 8 MG tablet Take 8 mg by mouth every 12 (twelve) hours as needed for nausea. Take two times a day starting the day after chemo for 3 days. Then take two times a day as needed for nausea or vomiting.      . pantoprazole (PROTONIX) 40 MG tablet Take 40 mg by mouth every morning.       . rosuvastatin (CRESTOR) 10 MG tablet Take 10 mg by mouth every evening.       Marland Kitchen HYDROcodone-acetaminophen (NORCO/VICODIN) 5-325 MG per tablet Take 1 tablet by mouth every 6 (six) hours as needed for pain.  30 tablet  0  . loperamide (IMODIUM)  2 MG capsule Take 2 mg by mouth 4 (four) times daily as needed for diarrhea or loose stools.      Marland Kitchen oxyCODONE-acetaminophen (PERCOCET/ROXICET) 5-325 MG per tablet 1 tablet every 4 (four) hours as needed for pain.       . valACYclovir (VALTREX) 1000 MG tablet Take 1,000 mg by mouth See admin instructions. Take 100 mg twice daily in the morning and in the evening as needed       Anti-infectives   Start     Dose/Rate Route Frequency Ordered Stop   09/22/12 1800  vancomycin (VANCOCIN) IVPB 1000 mg/200 mL premix     1,000 mg 200 mL/hr over 60 Minutes Intravenous Every 12 hours 09/22/12 1552     09/22/12 1600  ceFEPIme (MAXIPIME) 2 g in dextrose 5 % 50 mL IVPB     2 g 100 mL/hr over 30 Minutes Intravenous 3 times per day 09/22/12 1552       Assessment: 72yo F with fever, rigors, nausea, ANC 0.9, last chemo given 6/6, Neulasta given 6/7. Pharmacy asked to start Vanc and Cefepime for febrile neutropenia.  SCr wnl, CrCl 60-70.  Goal of Therapy:  Vancomycin trough level 15-20 mcg/ml  Plan:   Vancomycin 1g IV q12h.  Cefepime 2g IV q8h.  Measure Vanc trough at steady state.  Follow up renal fxn and culture results.  Charolotte Eke, PharmD, pager [phone]. 09/22/2012,3:55 PM.

## 2012-09-22 NOTE — Patient Instructions (Signed)
You will need to be admitted.  We will see you in the hospital.  Please call us if you have any questions or concerns.

## 2012-09-22 NOTE — Progress Notes (Signed)
OFFICE PROGRESS NOTE  CC  [name], MD [address] [address] Dr. Almond Lint  Dr. 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   STAGE:  Cancer of lower-inner quadrant of female breast  Primary site: Breast (Right)  Staging method: AJCC 7th Edition  Clinical: Stage IA (T1c, N0, cM0)  Summary: Stage IA (T1c, N0, cM0)   PRIOR THERAPY: #1 patient originally presented at the [facility] with a mammogram that showed architectural distortion. Ultrasound showed a 1.2 cm mass in the 4:00 position of the right breast. MRI confirmed a solitary lesion measuring 1.6 cm up. 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  #2 patient is status post lumpectomy with sentinel lymph node biopsy the final pathology revealed a 1.7 cm invasive lobular carcinoma. Tumor was ER positive PR positive HER-2/neu negative with a Ki-67 of 12% 4 sentinel nodes were negative for metastatic disease.  #3 patient had Oncotype DX testing performed her recurrence score was 29 giving her a 19% risk of distant recurrence with adjuvant hormonal therapy only. Patient and I and her family discussed her Oncotype DX testing this puts her in the intermediate risk category. We discussed side effects of chemotherapy types of chemotherapy. I would recommend Taxotere and Cytoxan every 3 weeks for 4 cycles. We can certainly begin this as soon as the Port-A-Cath is placed.  #4 patient had her first cycle of chemotherapy on 08/28/2011. Unfortunately she did develop significant side effects that did require hospitalization. Because of this I am going to plan on using her doses of Taxotere as well as Cytoxan 2 ideal BSA. I have explained this rationale to the patient as well as her daughter who accompanies her today.  CURRENT THERAPY:adjuvant chemotherapy  consisting of  Taxotere Cytoxan q. 21 days she is cycle 2 day 6.    INTERVAL HISTORY: Shelia Obrien 72 y.o. female returns for an urgent visit after cycle 2 of her chemotherapy.  She is feeling weak.  She began having stomach cramping this morning and vomiting since she arrived.  Her last bowel movement was this morning, but it was a small bowel movement.  She is also having rigors in the middle of our interview.  She has two blankets on, and is feeling very poorly.  She denies fevers, but currently she has a temp of 100 orally.  She denies any other pain, and otherwise a 10 point ROS is neg.   MEDICAL HISTORY: Past Medical History  Diagnosis Date  . Bronchitis   . Thyroid disease   . Acid reflux disease   . High cholesterol   . Breast cancer   . Seasonal allergies   . Hypothyroidism   . Leaking of urine     Dribbles if coughs. Pt wears pad  . Epistaxis     went to [facility] ER  . Shortness of breath   . PONV (postoperative nausea and vomiting)   . Wears glasses   . Arthritis     ALLERGIES:  has No Known Allergies.  MEDICATIONS:  Current Outpatient Prescriptions  Medication Sig Dispense Refill  . b complex vitamins tablet Take 1 tablet by mouth every morning.       . bifidobacterium infantis (ALIGN) capsule Take 1 capsule by mouth daily.      . cholecalciferol (VITAMIN D) 1000 UNITS tablet  Take 5,000 Units by mouth once a week. On Wednesdays      . dexamethasone (DECADRON) 4 MG tablet Take 2 tablets (8 mg total) by mouth 2 (two) times daily with a meal. Take two times a day the day before Taxotere. Then take two times a day starting the day after chemo for 3 days.  30 tablet  1  . gabapentin (NEURONTIN) 100 MG capsule Take 100-300 mg by mouth daily as needed (for pain). Nerve pain      . HYDROcodone-acetaminophen (NORCO/VICODIN) 5-325 MG per tablet Take 1 tablet by mouth every 6 (six) hours as needed for pain.  30 tablet  0  . levothyroxine (SYNTHROID, LEVOTHROID) 75 MCG tablet  Take 75 mcg by mouth every morning.       . lidocaine-prilocaine (EMLA) cream       . loperamide (IMODIUM) 2 MG capsule Take 2 mg by mouth 4 (four) times daily as needed for diarrhea or loose stools.      Marland Kitchen loratadine (CLARITIN) 10 MG tablet Take 10 mg by mouth every morning.      Marland Kitchen LORazepam (ATIVAN) 0.5 MG tablet Take 1 tablet (0.5 mg total) by mouth every 6 (six) hours as needed (Nausea or vomiting).  30 tablet  0  . metoprolol succinate (TOPROL-XL) 25 MG 24 hr tablet Take 1 tablet (25 mg total) by mouth daily.  30 tablet  3  . ondansetron (ZOFRAN) 8 MG tablet Take 8 mg by mouth every 12 (twelve) hours as needed for nausea. Take two times a day starting the day after chemo for 3 days. Then take two times a day as needed for nausea or vomiting.      Marland Kitchen oxyCODONE-acetaminophen (PERCOCET/ROXICET) 5-325 MG per tablet       . pantoprazole (PROTONIX) 40 MG tablet Take 40 mg by mouth every morning.       . prochlorperazine (COMPAZINE) 10 MG tablet       . rosuvastatin (CRESTOR) 10 MG tablet Take 10 mg by mouth every evening.       . valACYclovir (VALTREX) 1000 MG tablet        No current facility-administered medications for this visit.    SURGICAL HISTORY:  Past Surgical History  Procedure Laterality Date  . Abdominal hysterectomy    . Cholecystectomy    . Upper gi endoscopy    . Colonoscopy    . Knee arthroscopy Left   . Nasal septum surgery    . Breast lumpectomy with needle localization and axillary sentinel lymph node bx Right 07/22/2012    Procedure: BREAST LUMPECTOMY WITH NEEDLE LOCALIZATION AND AXILLARY SENTINEL LYMPH NODE BX;  Surgeon: Almond Lint, MD;  Location: [facility] OR;  Service: General;  Laterality: Right;  . Portacath placement Left 08/25/2012    Procedure: INSERTION PORT-A-CATH;  Surgeon: Almond Lint, MD;  Location: [facility];  Service: General;  Laterality: Left;    REVIEW OF SYSTEMS: General: fatigue (+), night sweats (+), fever (-), pain (-) Lymph: palpable  nodes (-) HEENT: vision changes (-), mucositis (-), gum bleeding (-), epistaxis (-) Cardiovascular: chest pain (-), palpitations (-) Pulmonary: shortness of breath (-), dyspnea on exertion (-), cough (-), hemoptysis (-) GI:  Early satiety (-), melena (-), dysphagia (-), nausea/vomiting (+), diarrhea (-) GU: dysuria (-), hematuria (-), incontinence (-) Musculoskeletal: joint swelling (-), joint pain (-), back pain (-) Neuro: weakness (-), numbness (-), headache (-), confusion (-) Skin: Rash (-), lesions (-), dryness (-) Psych: depression (-), suicidal/homicidal ideation (-),  feeling of hopelessness (-)    PHYSICAL EXAMINATION: Blood pressure 145/88, pulse 87, temperature 98.1 F (36.7 C), temperature source Oral, resp. rate 20, height 5\' 3"  (1.6 m), weight 152 lb 11.2 oz (69.264 kg). Body mass index is 27.06 kg/(m^2). General: Patient is a well appearing female in no acute distress HEENT: PERRLA, sclerae anicteric no conjunctival pallor, MMM Neck: supple, no palpable adenopathy Lungs: clear to auscultation bilaterally, no wheezes, rhonchi, or rales Cardiovascular: regular rate rhythm, S1, S2, no murmurs, rubs or gallops Abdomen: Soft, non-tender, non-distended, normoactive bowel sounds, no HSM Extremities: warm and well perfused, no clubbing, cyanosis, or edema Skin: No rashes or lesions Neuro: Non-focal ECOG PERFORMANCE STATUS: 0 - Asymptomatic    LABORATORY DATA: Lab Results  Component Value Date   WBC 2.5* 09/22/2012   HGB 14.1 09/22/2012   HCT 41.0 09/22/2012   MCV 92.1 09/22/2012   PLT 158 09/22/2012      Chemistry      Component Value Date/Time   NA 138 09/20/2012 1017   NA 135 09/03/2012 0430   K 4.1 09/20/2012 1017   K 4.1 09/03/2012 0430   CL 106 09/20/2012 1017   CL 104 09/03/2012 0430   CO2 24 09/20/2012 1017   CO2 22 09/03/2012 0430   BUN 19.1 09/20/2012 1017   BUN 12 09/03/2012 0430   CREATININE 0.7 09/20/2012 1017   CREATININE 0.91 09/03/2012 0430      Component Value  Date/Time   CALCIUM 8.7 09/20/2012 1017   CALCIUM 8.2* 09/03/2012 0430   ALKPHOS 55 09/20/2012 1017   ALKPHOS 51 09/02/2012 0650   AST 18 09/20/2012 1017   AST 25 09/02/2012 0650   ALT 23 09/20/2012 1017   ALT 20 09/02/2012 0650   BILITOT 2.18* 09/20/2012 1017   BILITOT 1.3* 09/02/2012 0650     ADDITIONAL INFORMATION: 1. A sample (block 1A) was sent to [facility] for Oncotype testing. The patient's recurrence score is 29. Those patients who had a recurrence score of 29 had an average rate of distant recurrence of 19%. (JBK:caf 08/06/12) Pecola Leisure MD Pathologist, Electronic Signature ( Signed 08/06/2012) 1. CHROMOGENIC IN-SITU HYBRIDIZATION Results: HER-2/NEU BY CISH - NO AMPLIFICATION OF HER-2 DETECTED. RESULT RATIO OF HER2: CEP 17 SIGNALS 0.94 AVERAGE HER2 COPY NUMBER PER CELL 2.20 REFERENCE RANGE NEGATIVE HER2/Chr17 Ratio <2.0 and Average HER2 copy number <4.0 EQUIVOCAL HER2/Chr17 Ratio <2.0 and Average HER2 copy number 4.0 and <6.0 POSITIVE HER2/Chr17 Ratio >=2.0 and/or Average HER2 copy number >=6.0I Jimmy Picket MD Pathologist, Electronic Signature ( Signed 07/30/2012) FINAL DIAGNOSIS Diagnosis 1. Breast, lumpectomy, right 1 of 4 FINAL for MARKA, TRELOAR D [phone]) Diagnosis(continued) - INVASIVE LOBULAR CARCINOMA, GRADE II/III, SPANNING 1.7 CM. - LOBULAR CARCINOMA IN SITU. - PERINEURAL INVASION IS IDENTIFIED. - THE SURGICAL RESECTION MARGINS ARE NEGATIVE FOR CARCINOMA. - SEE ONCOLOGY TABLE BELOW. 2. Lymph node, sentinel, biopsy, #1 right axilla - THERE IS NO EVIDENCE OF CARCINOMA IN 1 OF 1 LYMPH NODE (0/1). 3. Lymph node, sentinel, biopsy, #2 right axilla - THERE IS NO EVIDENCE OF CARCINOMA IN 1 OF 1 LYMPH NODE (0/1). 4. Lymph node, sentinel, biopsy, #3, right axilla - THERE IS NO EVIDENCE OF CARCINOMA IN 1 OF 1 LYMPH NODE (0/1). 5. Lymph node, sentinel, biopsy, #4, right axilla - THERE IS NO EVIDENCE OF CARCINOMA IN 1 OF 1 LYMPH NODE (0/1). Microscopic Comment 1.  BREAST, INVASIVE TUMOR, WITH LYMPH NODE SAMPLING Specimen, including laterality: Right breast. Procedure: Lumpectomy. Grade: II. Tubule formation: 3. Nuclear pleomorphism:  2. Mitotic: 1. Tumor size (gross measurement): 1.7 cm. Margins: Invasive, distance to closest margin: 0.5 cm to the anterior and superior margins (gross measurements). Lymphovascular invasion: Not identified. Ductal carcinoma in situ: Not identified. Tumor focality: Unifocal. Treatment effect: N/A. Extent of tumor: Confined to breast parenchyma. Lymph nodes: # examined: 4. Lymph nodes with metastasis: 0. Breast prognostic profile: Case [id number]. Estrogen receptor: 93%, strong staining intensity. Progesterone receptor: 7%, strong staining intensity. HER-2/neu: No amplification was detected. The ratio was 1.04. HER-2/neu by CISH will be repeated on the current case and the results reported separately. Ki-67: 12%. Non-neoplastic breast: Healing biopsy site. TNM: pT1c, pN0. Comments: Immunohistochemical stains performed on parts 2-5 fail to highlight the presence of cytokeratin positive tumor cells. (JBK:eps 07/27/12) JOSHUA  RADIOGRAPHIC STUDIES:  No results found.  ASSESSMENT: 72 year old female with  #1 new diagnosis of invasive lobular carcinoma measuring 1.7 cm node negative ER positive PR positive HER-2/neu negative with Ki-67 low. Patient had Oncotype DX testing performed which showed an intermediate risk category recurrence score. We discussed chemotherapy adjuvantly for 4 cycles. We discussed Taxotere and Cytoxan every 21 days for a total of 4 cycles. Side effects of treatment were discussed very clearly.  #2 Patient is receiving Taxotere and Cytoxan. She understands the side effects and benefits for it.  #3 she was given a prescription for a cranial prosthesis. She has all of her antiemetics. She was taught how to take these.   PLAN:  #1Patient needs to be admitted to the hospital for evaluation  and further monitoring.  Her ANC is 900 and she is having rigors with a rising fever of 100.  She feels very poorly.  We will admit her through the [facility] hospitalists with pan-cultures, chest x ray, urinalysis, urine culture, broad spectrum IV antibiotics, IV fluids, a KUB, IV anti-emetics, and a bowel regimen. We started NS IVF in clinic and gave her 8mg  IV Zofran as well.     All questions were answered. The patient knows to call the clinic with any problems, questions or concerns. We can certainly see the patient much sooner if necessary.  I spent 40 minutes counseling the patient face to face. The total time spent in the appointment was 60 minutes.  This was reviewed with Dr. Welton Flakes and Dr. Welton Flakes evaluated the patient as well.   Cherie Ouch Lyn Hollingshead, NP Medical Oncology [facility] Phone: [phone]

## 2012-09-22 NOTE — Progress Notes (Signed)
Patient arrived to unit at 1430, alert and orientedx4, denies pain at this time,endorsed to [facility] RN.- Hulda Marin RN

## 2012-09-23 ENCOUNTER — Ambulatory Visit: Payer: Medicare Other | Admitting: Adult Health

## 2012-09-23 DIAGNOSIS — D696 Thrombocytopenia, unspecified: Secondary | ICD-10-CM

## 2012-09-23 DIAGNOSIS — R55 Syncope and collapse: Secondary | ICD-10-CM

## 2012-09-23 DIAGNOSIS — C50319 Malignant neoplasm of lower-inner quadrant of unspecified female breast: Secondary | ICD-10-CM

## 2012-09-23 LAB — CBC WITH DIFFERENTIAL/PLATELET
Basophils Absolute: 0 10*3/uL (ref 0.0–0.1)
Basophils Relative: 1 % (ref 0–1)
HCT: 32.9 % — ABNORMAL LOW (ref 36.0–46.0)
Hemoglobin: 11.3 g/dL — ABNORMAL LOW (ref 12.0–15.0)
Lymphocytes Relative: 72 % — ABNORMAL HIGH (ref 12–46)
Monocytes Relative: 13 % — ABNORMAL HIGH (ref 3–12)
Neutro Abs: 0.2 10*3/uL — ABNORMAL LOW (ref 1.7–7.7)
Neutrophils Relative %: 13 % — ABNORMAL LOW (ref 43–77)
RDW: 14.3 % (ref 11.5–15.5)
WBC: 1.7 10*3/uL — ABNORMAL LOW (ref 4.0–10.5)

## 2012-09-23 LAB — COMPREHENSIVE METABOLIC PANEL
BUN: 9 mg/dL (ref 6–23)
CO2: 23 mEq/L (ref 19–32)
Calcium: 7.2 mg/dL — ABNORMAL LOW (ref 8.4–10.5)
GFR calc Af Amer: >90 mL/min (ref >90)
GFR calc non Af Amer: 84 mL/min — ABNORMAL LOW (ref >90)
Glucose, Bld: 108 mg/dL — ABNORMAL HIGH (ref 70–99)
Total Protein: 4.8 g/dL — ABNORMAL LOW (ref 6.0–8.3)

## 2012-09-23 LAB — URINE CULTURE: Colony Count: NO GROWTH

## 2012-09-23 NOTE — Consult Note (Signed)
Shelia Obrien   DOB:30-Jan-1941   ZO#:[id number]   JWJ#:[id number]  Subjective: Patient is a 72 year old female with stage I invasive lobular carcinoma, ER/PR positive, HER-2/neu negative undergoing adjuvant chemotherapy with Taxotere Cytoxan with Neulasta support.  She is cycle 2 day 7.  She came into clinic yesterday for evaluation after she called our nurse and felt miserable.  She began to have rigors during the visit, was noted to have an ANC of 900 and was admitted to the hospitalists for febrile neutropenia.  She was started on broad spectrum antibiotics, and pan-cultured.  Patient also had significant abdominal cramping and a KUB was normal.  Her Tmax was 103 at 9pm last night.  Her ANC is now approximately 200 today.  She is feeling better today, though her labs indicate differently, she denies any further fevers, nausea, vomiting, constipation, diarrhea, or any other concerns.  She is walking around her room without difficulty.     Objective:  Filed Vitals:   09/23/12 0559  BP: 112/52  Pulse: 92  Temp: 98.3 F (36.8 C)  Resp: 16    Body mass index is 27.54 kg/(m^2).  Intake/Output Summary (Last 24 hours) at 09/23/12 0905 Last data filed at 09/23/12 6213  Gross per 24 hour  Intake 1776.25 ml  Output   2300 ml  Net -523.75 ml     Sclerae unicteric  Oropharynx clear  No peripheral adenopathy  Lungs clear -- no rales or rhonchi  Heart regular rate and rhythm  Abdomen benign  MSK no focal spinal tenderness, no peripheral edema  Neuro nonfocal  Breast exam: deferred  CBG (last 3)  No results found for this basename: GLUCAP,  in the last 72 hours   Labs:  Lab Results  Component Value Date   WBC 1.7* 09/23/2012   HGB 11.3* 09/23/2012   HCT 32.9* 09/23/2012   MCV 90.1 09/23/2012   PLT 136* 09/23/2012   NEUTROABS 0.2* 09/23/2012    Urine Studies No results found for this basename: UACOL, UAPR, USPG, UPH, UTP, UGL, UKET, UBIL, UHGB, UNIT, UROB, ULEU, UEPI, UWBC, URBC, UBAC,  CAST, CRYS, UCOM, BILUA,  in the last 72 hours  Basic Metabolic Panel:  Recent Labs Lab 09/17/12 0917 09/20/12 1017 09/22/12 1202 09/23/12 0553  NA 139 138 134* 138  K 4.1 4.1 4.5 3.6  CL 109* 106 99 108  CO2 22 24 24 23   GLUCOSE 114* 109* 113* 108*  BUN 16.7 19.1 13.4 9  CREATININE 0.7 0.7 0.8 0.69  CALCIUM 9.3 8.7 9.3 7.2*   GFR Estimated Creatinine Clearance: 58.9 ml/min (by C-G formula based on Cr of 0.69). Liver Function Tests:  Recent Labs Lab 09/17/12 0917 09/20/12 1017 09/22/12 1202 09/23/12 0553  AST 30 18 23 14   ALT 23 23 22 14   ALKPHOS 48 55 70 43  BILITOT 0.57 2.18* 2.60* 1.3*  PROT 6.5 5.9* 6.5 4.8*  ALBUMIN 3.3* 3.2* 3.4* 2.2*   No results found for this basename: LIPASE, AMYLASE,  in the last 168 hours No results found for this basename: AMMONIA,  in the last 168 hours Coagulation profile No results found for this basename: INR, PROTIME,  in the last 168 hours  CBC:  Recent Labs Lab 09/17/12 0917 09/20/12 1017 09/22/12 1202 09/23/12 0553  WBC 13.1* 30.5* 2.5* 1.7*  NEUTROABS 11.2* 28.9* 0.9* 0.2*  HGB 13.3 12.2 14.1 11.3*  HCT 39.5 36.5 41.0 32.9*  MCV 91.9 92.9 92.1 90.1  PLT 388 259 158 136*  Cardiac Enzymes: No results found for this basename: CKTOTAL, CKMB, CKMBINDEX, TROPONINI,  in the last 168 hours BNP: No components found with this basename: POCBNP,  CBG: No results found for this basename: GLUCAP,  in the last 168 hours D-Dimer No results found for this basename: DDIMER,  in the last 72 hours Hgb A1c No results found for this basename: HGBA1C,  in the last 72 hours Lipid Profile No results found for this basename: CHOL, HDL, LDLCALC, TRIG, CHOLHDL, LDLDIRECT,  in the last 72 hours Thyroid function studies No results found for this basename: TSH, T4TOTAL, FREET3, T3FREE, THYROIDAB,  in the last 72 hours Anemia work up No results found for this basename: VITAMINB12, FOLATE, FERRITIN, TIBC, IRON, RETICCTPCT,  in the last 72  hours Microbiology Recent Results (from the past 240 hour(s))  CULTURE, BLOOD (ROUTINE X 2)     Status: None   Collection Time    09/22/12  4:24 PM      Result Value Range Status   Specimen Description BLOOD LEFT HAND   Final   Special Requests BOTTLES DRAWN AEROBIC AND ANAEROBIC 5CC   Final   Culture  Setup Time 09/22/2012 22:01   Final   Culture     Final   Value:        BLOOD CULTURE RECEIVED NO GROWTH TO DATE CULTURE WILL BE HELD FOR 5 DAYS BEFORE ISSUING A FINAL NEGATIVE REPORT   Report Status PENDING   Incomplete      Studies:  Dg Abd Acute W/chest  09/22/2012   *RADIOLOGY REPORT*  Clinical Data: 72 year old female with nausea vomiting abdominal pain.  Decreased bowel sounds.  ACUTE ABDOMEN SERIES (ABDOMEN 2 VIEW & CHEST 1 VIEW)  Comparison: CT abdomen and pelvis 09/02/2012 and earlier.  Findings: Stable left chest Port-A-Cath.  Stable postoperative changes to the right axilla and chest wall.  Right upper quadrant surgical clips also noted.  Stable lung volumes.  No pneumothorax or pneumoperitoneum.  Cardiac size and mediastinal contours are within normal limits.  No acute pulmonary opacity.  Paucity of distal bowel gas.  Gas is seen in the mid abdominal small and large bowel loops.  No dilated bowel loops are identified.  Stable pelvic phleboliths and flank injection granulomas.  Abdominal and pelvic visceral contours are within normal limits. No acute osseous abnormality identified.  IMPRESSION: 1. Nonobstructed bowel gas pattern, no free air. 2. No acute cardiopulmonary abnormality.   Original Report Authenticated By: Erskine Speed, M.D.    Assessment/Plan: 72 y.o. with stage IA invasive lobular carcinoma of the right breast, currently cycle 2 day 7 of adjuvant Taxotere/Cytoxan admitted with febrile neutropenia.    1. Invasive Lobular Carcinoma of the right breast.  Currently undergoing adjuvant chemotherapy.  As this is the second admission for febrile neutropenia, we will discuss  with the patient and her family regarding either stopping future adjuvant treatments, or switching to a less myelosuppressive regimen such as CMF. I discussed this with Ms. Harlacher and she will think about it.     2. Febrile neutropenia:  Blood culture and urine culture show no growth to date.  Continue broad spectrum antibiotics.    3. Constipation/Abdominal cramping:  4. FEN: Continue IV fluids and regular diet.    5. Hypothyroidism: as per hospitalist team.    Cherie Ouch. Lyn Hollingshead, NP Medical Oncology [facility] Phone: [phone]     Augustin Schooling 09/23/2012

## 2012-09-23 NOTE — Care Management Note (Signed)
CARE MANAGEMENT NOTE 09/23/2012  Patient:  Shelia Obrien, Shelia Obrien   Account Number:  [id number]  Date Initiated:  09/23/2012  Documentation initiated by:  Sidharth Leverette  Subjective/Objective Assessment:   72 yo female admitted with neutropenic fever and ABD pain. PTA pt from home with family.     Action/Plan:   Home when stable   Anticipated DC Date:     Anticipated DC Plan:  HOME/SELF CARE      DC Planning Services  CM consult      Choice offered to / List presented to:  NA   DME arranged  NA      DME agency  NA     HH arranged  NA      HH agency  NA   Status of service:  In process, will continue to follow Medicare Important Message given?   (If response is "NO", the following Medicare IM given date fields will be blank) Date Medicare IM given:   Date Additional Medicare IM given:    Discharge Disposition:    Per UR Regulation:  Reviewed for med. necessity/level of care/duration of stay  If discussed at Long Length of Stay Meetings, dates discussed:    Comments:  09/23/12 1017 Renardo Cheatum,RN,BSN [phone] chart reviewed for utilization of services. No needs identifid at this time. Pt to dc home with family unless otherwise consulted for [facility] needs.

## 2012-09-23 NOTE — Progress Notes (Signed)
INITIAL NUTRITION ASSESSMENT  DOCUMENTATION CODES Per approved criteria  -Not Applicable   INTERVENTION: Provide Snacks BID Encourage PO intake  NUTRITION DIAGNOSIS: Inadequate oral intake related to decreased appetite as evidenced by pt at 95% of usually body weight.   Goal: Pt to meet >/= 90% of their estimated nutrition needs  Monitor:  PO intake Weight Labs  Reason for Assessment: Malnutrition Screening Tool, score of 2  72 y.o. female  Admitting Dx: Neutropenia, febrile  ASSESSMENT: 72 y.o. female who was sent from Dr. Milta Deiters office with an ANC of 900, rigors and fever of 100. She was having vomiting x1 and constipation with abd cramps. She did have a large BM before admission to hospital and cramps have resolved. No blood in her BM. She is still "cold".  Pt reports that her appetite is fair; she ate 100% of her breakfast but, it was a small amount of food. Pt states that PTA she was eating small frequent amounts all day and drinking one nutritional supplement blended with banana daily. Pt complains of dry mouth and states that sucking on candies and rinsing mouth with water helps. Pt's usual body weight is 163 lbs. Pt reports that the primary source of protein in her diet is eggs and peanut butter; reviewed protein rich foods with patient and encouraged pt to incorporate them daily. Pt is not interested in nutritional supplements at this time. Pt made aware that snacks are available on unit.   Height: Ht Readings from Last 1 Encounters:  09/22/12 5\' 3"  (1.6 m)    Weight: Wt Readings from Last 1 Encounters:  09/22/12 155 lb 6.8 oz (70.5 kg)    Ideal Body Weight: 115 lbs  % Ideal Body Weight: 135%  Wt Readings from Last 10 Encounters:  09/22/12 155 lb 6.8 oz (70.5 kg)  09/22/12 152 lb 11.2 oz (69.264 kg)  09/20/12 158 lb 14.4 oz (72.077 kg)  09/17/12 153 lb 14.4 oz (69.809 kg)  09/04/12 153 lb (69.4 kg)  08/30/12 155 lb 6.4 oz (70.489 kg)  08/27/12 156 lb 4.8  oz (70.897 kg)  08/25/12 156 lb 9.6 oz (71.033 kg)  08/25/12 156 lb 9.6 oz (71.033 kg)  08/13/12 157 lb 12.8 oz (71.578 kg)    Usual Body Weight: 163 lbs  % Usual Body Weight: 95%  BMI:  Body mass index is 27.54 kg/(m^2).  Estimated Nutritional Needs: Kcal: 1760-1970 Protein: 85-98 grams Fluid: 2.2 L  Skin: WDL  Diet Order: General  EDUCATION NEEDS: -No education needs identified at this time   Intake/Output Summary (Last 24 hours) at 09/23/12 1208 Last data filed at 09/23/12 0832  Gross per 24 hour  Intake 1776.25 ml  Output   2300 ml  Net -523.75 ml    Last BM: 6/11  Labs:   Recent Labs Lab 09/20/12 1017 09/22/12 1202 09/23/12 0553  NA 138 134* 138  K 4.1 4.5 3.6  CL 106 99 108  CO2 24 24 23   BUN 19.1 13.4 9  CREATININE 0.7 0.8 0.69  CALCIUM 8.7 9.3 7.2*  GLUCOSE 109* 113* 108*    CBG (last 3)  No results found for this basename: GLUCAP,  in the last 72 hours  Scheduled Meds: . ceFEPime (MAXIPIME) IV  2 g Intravenous Q8H  . docusate sodium  100 mg Oral BID  . enoxaparin (LOVENOX) injection  40 mg Subcutaneous Q24H  . levothyroxine  75 mcg Oral QAC breakfast  . metoprolol succinate  25 mg Oral Daily  .  pantoprazole  40 mg Oral q morning - 10a  . vancomycin  1,000 mg Intravenous Q12H    Continuous Infusions: . sodium chloride 75 mL/hr at 09/23/12 0516    Past Medical History  Diagnosis Date  . Bronchitis   . Thyroid disease   . Acid reflux disease   . High cholesterol   . Breast cancer   . Seasonal allergies   . Hypothyroidism   . Leaking of urine     Dribbles if coughs. Pt wears pad  . Epistaxis     went to [facility] ER  . Shortness of breath   . PONV (postoperative nausea and vomiting)   . Wears glasses   . Arthritis     Past Surgical History  Procedure Laterality Date  . Abdominal hysterectomy    . Cholecystectomy    . Upper gi endoscopy    . Colonoscopy    . Knee arthroscopy Left   . Nasal septum surgery    .  Breast lumpectomy with needle localization and axillary sentinel lymph node bx Right 07/22/2012    Procedure: BREAST LUMPECTOMY WITH NEEDLE LOCALIZATION AND AXILLARY SENTINEL LYMPH NODE BX;  Surgeon: Almond Lint, MD;  Location: [facility] OR;  Service: General;  Laterality: Right;  . Portacath placement Left 08/25/2012    Procedure: INSERTION PORT-A-CATH;  Surgeon: Almond Lint, MD;  Location: [facility];  Service: General;  Laterality: Left;    Ian Malkin RD, LDN Inpatient Clinical Dietitian Pager: [phone] After Hours Pager: [phone]

## 2012-09-23 NOTE — Progress Notes (Signed)
Patient ID: JASA DUNDON, female   DOB: 1940/06/08, 72 y.o.   MRN: [id number] [facility] PROGRESS NOTE  AIREONNA BAUER NWG:[id number] DOB: 1940/09/15 DOA: 09/22/2012 PCP: [name], MD  Brief narrative: Pt is 72 yo female who was sent from Dr. Milta Deiters office with an ANC of 900, rigors and fever of 100 F. She was having vomiting x 1 and constipation with abd cramps. She did have a large BM before admission to hospital and cramps have resolved. She is currently undergoing chemotherapy of taxotere and cytoxan.   Principal Problem:   Neutropenia, febrile - unclear source of fever, neutropenia sequela of chemotherapy  - WBC trending down but pt clinically improving - will continue broad spectrum ABX for now - will follow up on blood and urine culture - supportive care with IVF, analgesia and antiemetics as needed - CBC in AM Active Problems:   Cancer of lower-inner quadrant of female breast - per oncology, pt expressed feeling that she does not wish to proceed with chemotherapy treatments at this time    Hypothyroidism - continue synthroid   Unspecified constipation - had BM prior to admission, will monitor   Consultants:  Oncology   Procedures/Studies: Dg Abd Acute W/chest 09/22/2012   1. Nonobstructed bowel gas pattern, no free air.  2. No acute cardiopulmonary abnormality.    Antibiotics:  Vancomycin 06/11 -->  Maxipime 06/11 -->  Code Status: Full Family Communication: Pt at bedside Disposition Plan: Home when medically stable  HPI/Subjective: No events overnight.   Objective: Filed Vitals:   09/22/12 2105 09/22/12 2240 09/23/12 0152 09/23/12 0559  BP: 112/52   112/52  Pulse: 121   92  Temp: 103 F (39.4 C) 100 F (37.8 C) 99 F (37.2 C) 98.3 F (36.8 C)  TempSrc: Oral Oral Oral Oral  Resp: 20   16  Height:      Weight:      SpO2: 98%   98%    Intake/Output Summary (Last 24 hours) at 09/23/12 1041 Last data filed at 09/23/12 5784  Gross per 24  hour  Intake 1776.25 ml  Output   2300 ml  Net -523.75 ml    Exam:   General:  Pt is alert, follows commands appropriately, not in acute distress  Cardiovascular: Regular rhythm, tachycardic, S1/S2, no murmurs, no rubs, no gallops  Respiratory: Clear to auscultation bilaterally, no wheezing, no crackles, no rhonchi  Abdomen: Soft, non tender, non distended, bowel sounds present, no guarding  Extremities: No edema, pulses DP and PT palpable bilaterally  Neuro: Grossly nonfocal  Data Reviewed: Basic Metabolic Panel:  Recent Labs Lab 09/17/12 0917 09/20/12 1017 09/22/12 1202 09/23/12 0553  NA 139 138 134* 138  K 4.1 4.1 4.5 3.6  CL 109* 106 99 108  CO2 22 24 24 23   GLUCOSE 114* 109* 113* 108*  BUN 16.7 19.1 13.4 9  CREATININE 0.7 0.7 0.8 0.69  CALCIUM 9.3 8.7 9.3 7.2*   Liver Function Tests:  Recent Labs Lab 09/17/12 0917 09/20/12 1017 09/22/12 1202 09/23/12 0553  AST 30 18 23 14   ALT 23 23 22 14   ALKPHOS 48 55 70 43  BILITOT 0.57 2.18* 2.60* 1.3*  PROT 6.5 5.9* 6.5 4.8*  ALBUMIN 3.3* 3.2* 3.4* 2.2*   CBC:  Recent Labs Lab 09/17/12 0917 09/20/12 1017 09/22/12 1202 09/23/12 0553  WBC 13.1* 30.5* 2.5* 1.7*  NEUTROABS 11.2* 28.9* 0.9* 0.2*  HGB 13.3 12.2 14.1 11.3*  HCT 39.5 36.5 41.0 32.9*  MCV  91.9 92.9 92.1 90.1  PLT 388 259 158 136*    Recent Results (from the past 240 hour(s))  CULTURE, BLOOD (ROUTINE X 2)     Status: None   Collection Time    09/22/12  4:24 PM      Result Value Range Status   Specimen Description BLOOD LEFT HAND   Final   Special Requests BOTTLES DRAWN AEROBIC AND ANAEROBIC 5CC   Final   Culture  Setup Time 09/22/2012 22:01   Final   Culture     Final   Value:        BLOOD CULTURE RECEIVED NO GROWTH TO DATE CULTURE WILL BE HELD FOR 5 DAYS BEFORE ISSUING A FINAL NEGATIVE REPORT   Report Status PENDING   Incomplete     Scheduled Meds: . ceFEPime IV  2 g Intravenous Q8H  . docusate sodium  100 mg Oral BID  .  enoxaparin injection  40 mg Subcutaneous Q24H  . levothyroxine  75 mcg Oral QAC breakfast  . metoprolol succinate  25 mg Oral Daily  . pantoprazole  40 mg Oral q morning - 10a  . vancomycin  1,000 mg Intravenous Q12H   Continuous Infusions: . sodium chloride 75 mL/hr at 09/23/12 0516   Debbora Presto, MD  [facility] Pager [phone]  If 7PM-7AM, please contact night-coverage [email or web address] Password [facility] 09/23/2012, 10:41 AM   LOS: 1 day

## 2012-09-24 ENCOUNTER — Other Ambulatory Visit: Payer: Medicare Other | Admitting: Lab

## 2012-09-24 ENCOUNTER — Ambulatory Visit: Payer: Medicare Other | Admitting: Oncology

## 2012-09-24 LAB — BASIC METABOLIC PANEL
BUN: 9 mg/dL (ref 6–23)
Chloride: 106 mEq/L (ref 96–112)
GFR calc Af Amer: 83 mL/min — ABNORMAL LOW (ref >90)
GFR calc non Af Amer: 71 mL/min — ABNORMAL LOW (ref >90)
Potassium: 3.7 mEq/L (ref 3.5–5.1)
Sodium: 138 mEq/L (ref 135–145)

## 2012-09-24 LAB — CBC
HCT: 35.1 % — ABNORMAL LOW (ref 36.0–46.0)
Hemoglobin: 12 g/dL (ref 12.0–15.0)
MCHC: 34.2 g/dL (ref 30.0–36.0)
RBC: 3.87 MIL/uL (ref 3.87–5.11)
WBC: 6.8 10*3/uL (ref 4.0–10.5)

## 2012-09-24 NOTE — Progress Notes (Addendum)
Patient ID: Shelia Obrien, female   DOB: June 21, 1940, 72 y.o.   MRN: [id number]  [facility] PROGRESS NOTE  Shelia Obrien JXB:[id number] DOB: 20-Dec-1940 DOA: 09/22/2012 PCP: [name], MD  Brief narrative:  Pt is 72 yo female who was sent from Dr. Milta Deiters office with an ANC of 900, rigors and fever of 100 F. She was having vomiting x 1 and constipation with abd cramps. She did have a large BM before admission to hospital and cramps have resolved. She is currently undergoing chemotherapy of taxotere and cytoxan.   Principal Problem:  Neutropenia, febrile  - unclear source of fever, neutropenia sequela of chemotherapy  - WBC trending up, will need to follow up in today's CBC - will continue broad spectrum ABX for now day #3 - blood and urine culture with no growth to date - supportive care with analgesia and antiemetics as needed  - CBC in AM  Active Problems:  Cancer of lower-inner quadrant of female breast  - per oncology, pt expressed feeling that she does not wish to proceed with chemotherapy treatments at this time  - will defer to Dr. Welton Flakes Hypothyroidism  - continue synthroid  Unspecified constipation  - had BM prior to admission, will monitor   Consultants:  Oncology  Procedures/Studies:  Dg Abd Acute W/chest  09/22/2012  1. Nonobstructed bowel gas pattern, no free air.  2. No acute cardiopulmonary abnormality.  Antibiotics:  Vancomycin 06/11 -->  Maxipime 06/11 -->  Code Status: Full  Family Communication: Pt at bedside  Disposition Plan: Home when medically stable   HPI/Subjective: No events overnight.   Objective: Filed Vitals:   09/23/12 0559 09/23/12 1408 09/23/12 2130 09/24/12 0500  BP: 112/52 114/50 125/69 108/67  Pulse: 92 96 93 72  Temp: 98.3 F (36.8 C) 98.7 F (37.1 C) 99.3 F (37.4 C) 98.4 F (36.9 C)  TempSrc: Oral  Oral Oral  Resp: 16 18 16 16   Height:      Weight:      SpO2: 98% 97% 99% 99%    Intake/Output Summary (Last 24  hours) at 09/24/12 0817 Last data filed at 09/24/12 0759  Gross per 24 hour  Intake 2832.5 ml  Output   1950 ml  Net  882.5 ml    Exam:   General:  Pt is alert, follows commands appropriately, not in acute distress  Cardiovascular: Regular rate and rhythm, S1/S2, no murmurs, no rubs, no gallops  Respiratory: Clear to auscultation bilaterally, no wheezing, no crackles, no rhonchi  Abdomen: Soft, non tender, non distended, bowel sounds present, no guarding  Extremities: No edema, pulses DP and PT palpable bilaterally  Neuro: Grossly nonfocal  Data Reviewed: Basic Metabolic Panel:  Recent Labs Lab 09/17/12 0917 09/20/12 1017 09/22/12 1202 09/23/12 0553 09/24/12 0540  NA 139 138 134* 138 138  K 4.1 4.1 4.5 3.6 3.7  CL 109* 106 99 108 106  CO2 22 24 24 23 25   GLUCOSE 114* 109* 113* 108* 90  BUN 16.7 19.1 13.4 9 9   CREATININE 0.7 0.7 0.8 0.69 0.80  CALCIUM 9.3 8.7 9.3 7.2* 8.0*   Liver Function Tests:  Recent Labs Lab 09/17/12 0917 09/20/12 1017 09/22/12 1202 09/23/12 0553  AST 30 18 23 14   ALT 23 23 22 14   ALKPHOS 48 55 70 43  BILITOT 0.57 2.18* 2.60* 1.3*  PROT 6.5 5.9* 6.5 4.8*  ALBUMIN 3.3* 3.2* 3.4* 2.2*   CBC:  Recent Labs Lab 09/17/12 0917 09/20/12 1017 09/22/12 1202  09/23/12 0553  WBC 13.1* 30.5* 2.5* 1.7*  NEUTROABS 11.2* 28.9* 0.9* 0.2*  HGB 13.3 12.2 14.1 11.3*  HCT 39.5 36.5 41.0 32.9*  MCV 91.9 92.9 92.1 90.1  PLT 388 259 158 136*     Recent Results (from the past 240 hour(s))  CULTURE, BLOOD (ROUTINE X 2)     Status: None   Collection Time    09/22/12  4:24 PM      Result Value Range Status   Specimen Description BLOOD LEFT HAND   Final   Special Requests BOTTLES DRAWN AEROBIC AND ANAEROBIC 5CC   Final   Culture  Setup Time 09/22/2012 22:01   Final   Culture     Final   Value:        BLOOD CULTURE RECEIVED NO GROWTH TO DATE CULTURE WILL BE HELD FOR 5 DAYS BEFORE ISSUING A FINAL NEGATIVE REPORT   Report Status PENDING    Incomplete  URINE CULTURE     Status: None   Collection Time    09/22/12  6:18 PM      Result Value Range Status   Specimen Description URINE, CLEAN CATCH   Final   Special Requests Immunocompromised   Final   Culture  Setup Time 09/22/2012 22:17   Final   Colony Count NO GROWTH   Final   Culture NO GROWTH   Final   Report Status 09/23/2012 FINAL   Final     Scheduled Meds: . ceFEPime IV  2 g Intravenous Q8H  . docusate sodium  100 mg Oral BID  . enoxaparin  injection  40 mg Subcutaneous Q24H  . levothyroxine  75 mcg Oral QAC breakfast  . metoprolol succinate  25 mg Oral Daily  . pantoprazole  40 mg Oral q morning - 10a  . vancomycin  1,000 mg Intravenous Q12H   Continuous Infusions: . sodium chloride 75 mL/hr at 09/23/12 2107   Debbora Presto, MD  [facility] Pager [phone]  If 7PM-7AM, please contact night-coverage [email or web address] Password [facility] 09/24/2012, 8:17 AM   LOS: 2 days

## 2012-09-25 LAB — BASIC METABOLIC PANEL
CO2: 24 mEq/L (ref 19–32)
Calcium: 8.6 mg/dL (ref 8.4–10.5)
Creatinine, Ser: 0.84 mg/dL (ref 0.50–1.10)
GFR calc non Af Amer: 67 mL/min — ABNORMAL LOW (ref >90)
Glucose, Bld: 102 mg/dL — ABNORMAL HIGH (ref 70–99)

## 2012-09-25 LAB — CBC
MCH: 30.5 pg (ref 26.0–34.0)
MCV: 90.3 fL (ref 78.0–100.0)
Platelets: 188 10*3/uL (ref 150–400)
RDW: 14.4 % (ref 11.5–15.5)
WBC: 19.5 10*3/uL — ABNORMAL HIGH (ref 4.0–10.5)

## 2012-09-25 MED ORDER — POTASSIUM CHLORIDE CRYS ER 20 MEQ PO TBCR
40.0000 meq | EXTENDED_RELEASE_TABLET | Freq: Once | ORAL | Status: AC
  Administered 2012-09-25: 40 meq via ORAL
  Filled 2012-09-25: qty 2

## 2012-09-25 NOTE — Progress Notes (Signed)
Patient ID: Shelia Obrien, female   DOB: 1940/06/24, 72 y.o.   MRN: [id number]  [facility] PROGRESS NOTE  Shelia Obrien NFA:[id number] DOB: Nov 10, 1940 DOA: 09/22/2012 PCP: [name], MD  Brief narrative:  Pt is 72 yo female who was sent from Dr. Milta Deiters office with an ANC of 900, rigors and fever of 100 F. She was having vomiting x 1 and constipation with abd cramps. She did have a large BM before admission to hospital and cramps have resolved. She is currently undergoing chemotherapy of taxotere and cytoxan.   Principal Problem:  Neutropenia, febrile  - unclear source of fever, neutropenia sequela of chemotherapy  - WBC trending up, now with leukocytosis and likely sequela of Neulasta that pt has received after chemotherapy - will continue broad spectrum ABX for now day #4, plan on narrowing in AM - blood and urine culture with no growth to date  - supportive care with analgesia and antiemetics as needed  - CBC in AM  Active Problems:  Hypokalemia - mild, will supplement today, repeat BMP in AM Cancer of lower-inner quadrant of female breast  - per oncology, pt expressed feeling that she does not wish to proceed with chemotherapy treatments at this time  - will defer to Dr. Welton Flakes  Hypothyroidism  - continue synthroid  Unspecified constipation  - had BM prior to admission, will monitor   Consultants:  Oncology  Procedures/Studies:  Dg Abd Acute W/chest  09/22/2012  1. Nonobstructed bowel gas pattern, no free air.  2. No acute cardiopulmonary abnormality.  Antibiotics:  Vancomycin 06/11 -->  Maxipime 06/11 -->  Code Status: Full  Family Communication: Pt at bedside  Disposition Plan: Home when medically stable   HPI/Subjective: No events overnight.   Objective: Filed Vitals:   09/24/12 1355 09/24/12 1935 09/24/12 2100 09/25/12 0500  BP: 132/55  116/67 105/53  Pulse: 83 80 80 80  Temp: 97.7 F (36.5 C)  98.3 F (36.8 C) 98.3 F (36.8 C)  TempSrc:   Oral  Oral  Resp: 18  16 18   Height:      Weight:      SpO2: 98%  98% 97%    Intake/Output Summary (Last 24 hours) at 09/25/12 0654 Last data filed at 09/25/12 0603  Gross per 24 hour  Intake   1633 ml  Output   2400 ml  Net   -767 ml    Exam:   General:  Pt is alert, follows commands appropriately, not in acute distress  Cardiovascular: Regular rate and rhythm, S1/S2, no murmurs, no rubs, no gallops  Respiratory: Clear to auscultation bilaterally, no wheezing, no crackles, no rhonchi  Abdomen: Soft, non tender, non distended, bowel sounds present, no guarding  Extremities: No edema, pulses DP and PT palpable bilaterally  Neuro: Grossly nonfocal  Data Reviewed: Basic Metabolic Panel:  Recent Labs Lab 09/20/12 1017 09/22/12 1202 09/23/12 0553 09/24/12 0540 09/25/12 0455  NA 138 134* 138 138 136  K 4.1 4.5 3.6 3.7 3.3*  CL 106 99 108 106 104  CO2 24 24 23 25 24   GLUCOSE 109* 113* 108* 90 102*  BUN 19.1 13.4 9 9 7   CREATININE 0.7 0.8 0.69 0.80 0.84  CALCIUM 8.7 9.3 7.2* 8.0* 8.6   Liver Function Tests:  Recent Labs Lab 09/20/12 1017 09/22/12 1202 09/23/12 0553  AST 18 23 14   ALT 23 22 14   ALKPHOS 55 70 43  BILITOT 2.18* 2.60* 1.3*  PROT 5.9* 6.5 4.8*  ALBUMIN 3.2*  3.4* 2.2*   CBC:  Recent Labs Lab 09/20/12 1017 09/22/12 1202 09/23/12 0553 09/24/12 0900 09/25/12 0455  WBC 30.5* 2.5* 1.7* 6.8 19.5*  NEUTROABS 28.9* 0.9* 0.2*  --   --   HGB 12.2 14.1 11.3* 12.0 11.3*  HCT 36.5 41.0 32.9* 35.1* 33.5*  MCV 92.9 92.1 90.1 90.7 90.3  PLT 259 158 136* 143* 188     Recent Results (from the past 240 hour(s))  CULTURE, BLOOD (ROUTINE X 2)     Status: None   Collection Time    09/22/12  4:24 PM      Result Value Range Status   Specimen Description BLOOD LEFT HAND   Final   Special Requests BOTTLES DRAWN AEROBIC AND ANAEROBIC 5CC   Final   Culture  Setup Time 09/22/2012 22:01   Final   Culture     Final   Value:        BLOOD CULTURE RECEIVED NO  GROWTH TO DATE CULTURE WILL BE HELD FOR 5 DAYS BEFORE ISSUING A FINAL NEGATIVE REPORT   Report Status PENDING   Incomplete  URINE CULTURE     Status: None   Collection Time    09/22/12  6:18 PM      Result Value Range Status   Specimen Description URINE, CLEAN CATCH   Final   Special Requests Immunocompromised   Final   Culture  Setup Time 09/22/2012 22:17   Final   Colony Count NO GROWTH   Final   Culture NO GROWTH   Final   Report Status 09/23/2012 FINAL   Final     Scheduled Meds: . ceFEPime (MAXIPIME) IV  2 g Intravenous Q8H  . enoxaparin (LOVENOX) injection  40 mg Subcutaneous Q24H  . levothyroxine  75 mcg Oral QAC breakfast  . metoprolol succinate  25 mg Oral Daily  . pantoprazole  40 mg Oral q morning - 10a  . vancomycin  1,000 mg Intravenous Q12H   Continuous Infusions:    Debbora Presto, MD  [facility] Pager [phone]  If 7PM-7AM, please contact night-coverage [email or web address] Password [email or web address] 09/25/2012, 6:54 AM   LOS: 3 days

## 2012-09-25 NOTE — Progress Notes (Signed)
ANTIBIOTIC CONSULT NOTE - INITIAL  Pharmacy Cefepime Indication: Febrile neutropenia  No Known Allergies  Patient Measurements: Height: 5\' 3"  (160 cm) Weight: 155 lb 6.8 oz (70.5 kg) IBW/kg (Calculated) : 52.4  Vital Signs: Temp: 98.3 F (36.8 C) (06/14 0500) Temp src: Oral (06/14 0500) BP: 127/83 mmHg (06/14 1006) Pulse Rate: 83 (06/14 1006) Intake/Output from previous day: 06/13 0701 - 06/14 0700 In: 1633 [P.O.:960; IV Piggyback:300] Out: 2400 [Urine:2400] Intake/Output from this shift: Total I/O In: 240 [P.O.:240] Out: 1450 [Urine:1450]  Labs:  Recent Labs  09/23/12 0553 09/24/12 0540 09/24/12 0900 09/25/12 0455  WBC 1.7*  --  6.8 19.5*  HGB 11.3*  --  12.0 11.3*  PLT 136*  --  143* 188  CREATININE 0.69 0.80  --  0.84   Estimated Creatinine Clearance: 56.1 ml/min (by C-G formula based on Cr of 0.84). No results found for this basename: VANCOTROUGH, Leodis Binet, VANCORANDOM, GENTTROUGH, GENTPEAK, GENTRANDOM, TOBRATROUGH, TOBRAPEAK, TOBRARND, AMIKACINPEAK, AMIKACINTROU, AMIKACIN,  in the last 72 hours   Microbiology: Recent Results (from the past 720 hour(s))  CULTURE, BLOOD (ROUTINE X 2)     Status: None   Collection Time    09/02/12 12:54 AM      Result Value Range Status   Specimen Description BLOOD LEFT HAND   Final   Special Requests BOTTLES DRAWN AEROBIC AND ANAEROBIC 5CC   Final   Culture  Setup Time 09/02/2012 08:53   Final   Culture NO GROWTH 5 DAYS   Final   Report Status 09/08/2012 FINAL   Final  CULTURE, BLOOD (ROUTINE X 2)     Status: None   Collection Time    09/02/12  1:45 AM      Result Value Range Status   Specimen Description BLOOD LEFT CHEST PORT   Final   Special Requests BOTTLES DRAWN AEROBIC AND ANAEROBIC   Final   Culture  Setup Time 09/02/2012 08:54   Final   Culture NO GROWTH 5 DAYS   Final   Report Status 09/08/2012 FINAL   Final  CLOSTRIDIUM DIFFICILE BY PCR     Status: None   Collection Time    09/02/12  4:16 AM   Result Value Range Status   C difficile by pcr NEGATIVE  NEGATIVE Final  CULTURE, BLOOD (ROUTINE X 2)     Status: None   Collection Time    09/22/12  4:24 PM      Result Value Range Status   Specimen Description BLOOD LEFT HAND   Final   Special Requests BOTTLES DRAWN AEROBIC AND ANAEROBIC 5CC   Final   Culture  Setup Time 09/22/2012 22:01   Final   Culture     Final   Value:        BLOOD CULTURE RECEIVED NO GROWTH TO DATE CULTURE WILL BE HELD FOR 5 DAYS BEFORE ISSUING A FINAL NEGATIVE REPORT   Report Status PENDING   Incomplete  URINE CULTURE     Status: None   Collection Time    09/22/12  6:18 PM      Result Value Range Status   Specimen Description URINE, CLEAN CATCH   Final   Special Requests Immunocompromised   Final   Culture  Setup Time 09/22/2012 22:17   Final   Colony Count NO GROWTH   Final   Culture NO GROWTH   Final   Report Status 09/23/2012 FINAL   Final    Medical History: Past Medical History  Diagnosis Date  .  Bronchitis   . Thyroid disease   . Acid reflux disease   . High cholesterol   . Breast cancer   . Seasonal allergies   . Hypothyroidism   . Leaking of urine     Dribbles if coughs. Pt wears pad  . Epistaxis     went to [facility]  . Shortness of breath   . PONV (postoperative nausea and vomiting)   . Wears glasses   . Arthritis     Medications:  Prescriptions prior to admission  Medication Sig Dispense Refill  . acetaminophen (TYLENOL) 500 MG tablet Take 500 mg by mouth every 6 (six) hours as needed for pain.      . bifidobacterium infantis (ALIGN) capsule Take 1 capsule by mouth daily.      . cholecalciferol (VITAMIN D) 1000 UNITS tablet Take 5,000 Units by mouth once a week. On Wednesdays      . dexamethasone (DECADRON) 4 MG tablet Take 2 tablets (8 mg total) by mouth 2 (two) times daily with a meal. Take two times a day the day before Taxotere. Then take two times a day starting the day after chemo for 3 days.  30 tablet  1  .  levothyroxine (SYNTHROID, LEVOTHROID) 75 MCG tablet Take 75 mcg by mouth every morning.       . lidocaine-prilocaine (EMLA) cream       . loratadine (CLARITIN) 10 MG tablet Take 10 mg by mouth every morning.      Marland Kitchen LORazepam (ATIVAN) 0.5 MG tablet Take 1 tablet (0.5 mg total) by mouth every 6 (six) hours as needed (Nausea or vomiting).  30 tablet  0  . metoprolol succinate (TOPROL-XL) 25 MG 24 hr tablet Take 1 tablet (25 mg total) by mouth daily.  30 tablet  3  . ondansetron (ZOFRAN) 8 MG tablet Take 8 mg by mouth every 12 (twelve) hours as needed for nausea. Take two times a day starting the day after chemo for 3 days. Then take two times a day as needed for nausea or vomiting.      . pantoprazole (PROTONIX) 40 MG tablet Take 40 mg by mouth every morning.       . rosuvastatin (CRESTOR) 10 MG tablet Take 10 mg by mouth every evening.       Marland Kitchen HYDROcodone-acetaminophen (NORCO/VICODIN) 5-325 MG per tablet Take 1 tablet by mouth every 6 (six) hours as needed for pain.  30 tablet  0  . loperamide (IMODIUM) 2 MG capsule Take 2 mg by mouth 4 (four) times daily as needed for diarrhea or loose stools.      Marland Kitchen oxyCODONE-acetaminophen (PERCOCET/ROXICET) 5-325 MG per tablet 1 tablet every 4 (four) hours as needed for pain.       . valACYclovir (VALTREX) 1000 MG tablet Take 1,000 mg by mouth See admin instructions. Take 100 mg twice daily in the morning and in the evening as needed       Anti-infectives   Start     Dose/Rate Route Frequency Ordered Stop   09/22/12 1800  vancomycin (VANCOCIN) IVPB 1000 mg/200 mL premix  Status:  Discontinued     1,000 mg 200 mL/hr over 60 Minutes Intravenous Every 12 hours 09/22/12 1552 09/25/12 0852   09/22/12 1600  ceFEPIme (MAXIPIME) 2 g in dextrose 5 % 50 mL IVPB     2 g 100 mL/hr over 30 Minutes Intravenous 3 times per day 09/22/12 1552       Assessment: 72yo F admitted  with Febrile neutropenia, Last chemo Given 6/6, Neulasta given 6/7.   D#4 Cefepime 2 grams IV q8h,  vancomycin discontinued today.  Scr remains WNL, remains AF, WBC spike to 19.5 likely from Neulasta.  ANC remains low at 0.2.  Urine culture 6/11 negative.  Blood x 1 from 6/11 is NGTD.   Goal of Therapy:  Cefepime per renal function   Plan:   Continue Cefepime 2g IV q8h.  Continue to monitor renal function and cultures.   BorgerdingLoma Messing PharmD Pager #: [phone] 12:18 PM 09/25/2012

## 2012-09-26 DIAGNOSIS — K5289 Other specified noninfective gastroenteritis and colitis: Secondary | ICD-10-CM

## 2012-09-26 LAB — CBC
Platelets: 182 10*3/uL (ref 150–400)
RDW: 14.8 % (ref 11.5–15.5)
WBC: 29.2 10*3/uL — ABNORMAL HIGH (ref 4.0–10.5)

## 2012-09-26 LAB — BASIC METABOLIC PANEL
Calcium: 8.5 mg/dL (ref 8.4–10.5)
Creatinine, Ser: 0.72 mg/dL (ref 0.50–1.10)
GFR calc Af Amer: >90 mL/min (ref >90)
GFR calc non Af Amer: 83 mL/min — ABNORMAL LOW (ref >90)
Sodium: 136 mEq/L (ref 135–145)

## 2012-09-26 MED ORDER — HEPARIN SOD (PORK) LOCK FLUSH 100 UNIT/ML IV SOLN
500.0000 [IU] | INTRAVENOUS | Status: AC | PRN
  Administered 2012-09-26: 500 [IU]
  Filled 2012-09-26: qty 5

## 2012-09-26 MED ORDER — LEVOFLOXACIN 500 MG PO TABS: 500.0000 mg | ORAL_TABLET | Freq: Every day | ORAL | Status: DC

## 2012-09-26 NOTE — Progress Notes (Signed)
Pt. Was discharged home. She was given her discharge information including where she could pick up her prescriptions.  Pt. Was transported home by family.

## 2012-09-26 NOTE — Discharge Summary (Signed)
Physician Discharge Summary  Shelia Obrien:[id number] DOB: 1940/11/28 DOA: 09/22/2012  PCP: [name], MD  Admit date: 09/22/2012 Discharge date: 09/26/2012  Recommendations for Outpatient Follow-up:  1. Pt will need to follow up with PCP in 2-3 weeks post discharge 2. Please obtain BMP to evaluate electrolytes and kidney function 3. Please also check CBC to evaluate Hg and Hct levels, WBC, platelets  4. Please note that pt was discharged on Levaquin  Discharge Diagnoses: Neutropenic fever  Principal Problem:   Neutropenia, febrile Active Problems:   Cancer of lower-inner quadrant of female breast   Hypothyroidism   Unspecified constipation  Discharge Condition: Stable  Diet recommendation: Heart healthy diet discussed in details   Brief narrative:  Pt is 72 yo female who was sent from Dr. Milta Deiters office with an ANC of 900, rigors and fever of 100 F. She was having vomiting x 1 and constipation with abd cramps. She did have a large BM before admission to hospital and cramps have resolved. She is currently undergoing chemotherapy of taxotere and cytoxan.   Principal Problem:  Neutropenia, febrile  - unclear source of fever, neutropenia sequela of chemotherapy  - WBC trending up, now with leukocytosis and likely sequela of Neulasta that pt has received after chemotherapy  - pt has received broad spectrum ABX day #5, she is clinically stable this AM, will transition to oral Levaquin to complete the therapy for 5 more days post discharge  - blood and urine culture with no growth to date  - supportive care with analgesia and antiemetics as needed provided and pt has responded well - CBC needs to be checked in an outpatient setting  Active Problems:  Hypokalemia  - mild, supplemented adequately and within normal limits this AM Cancer of lower-inner quadrant of female breast  - per oncology, pt expressed feeling that she does not wish to proceed with chemotherapy treatments at  this time  - will defer to Dr. Welton Flakes  Hypothyroidism  - continue synthroid  Unspecified constipation  - had BM prior to admission  Consultants:  Oncology  Procedures/Studies:  Dg Abd Acute W/chest  09/22/2012  1. Nonobstructed bowel gas pattern, no free air.  2. No acute cardiopulmonary abnormality.  Antibiotics:  Vancomycin 06/11 --> 6/15 Maxipime 06/11 --> 6/15 Levaquin 6/15 --> 5 more days post discharge   Code Status: Full  Family Communication: Pt at bedside   Discharge Exam: Filed Vitals:   09/26/12 0517  BP: 122/69  Pulse: 79  Temp: 98.3 F (36.8 C)  Resp: 18   Filed Vitals:   09/25/12 1006 09/25/12 1344 09/25/12 2035 09/26/12 0517  BP: 127/83 129/59 129/65 122/69  Pulse: 83 82 84 79  Temp:  99 F (37.2 C) 98.7 F (37.1 C) 98.3 F (36.8 C)  TempSrc:  Oral Oral Oral  Resp:  20 20 18   Height:      Weight:      SpO2:  99% 98% 98%    General: Pt is alert, follows commands appropriately, not in acute distress Cardiovascular: Regular rate and rhythm, S1/S2 +, no murmurs, no rubs, no gallops Respiratory: Clear to auscultation bilaterally, no wheezing, no crackles, no rhonchi Abdominal: Soft, non tender, non distended, bowel sounds +, no guarding Extremities: no edema, no cyanosis, pulses palpable bilaterally DP and PT Neuro: Grossly nonfocal  Discharge Instructions  Discharge Orders   Future Appointments Provider Department Dept Phone   10/08/2012 9:15 AM Krista Blue [facility] MEDICAL ONCOLOGY [phone]  10/08/2012 9:45 AM Victorino December, MD [facility] MEDICAL ONCOLOGY [phone]   10/08/2012 10:45 AM [facility] [phone]   10/09/2012 9:00 AM [facility] Inj Nurse [facility] [phone]   10/29/2012 8:45 AM Dava Najjar Idelle Jo [facility] [phone]   10/29/2012 9:15 AM Victorino December, MD [facility] [phone]   10/29/2012 10:15 AM [facility] [phone]   10/30/2012 9:00 AM [facility] In[facility] Nurse [place] [phone]   Future Orders Complete By Expires     Diet - low sodium heart healthy  As directed     Increase activity slowly  As directed         Medication List    STOP taking these medications       dexamethasone 4 MG tablet  Commonly known as:  DECADRON     HYDROcodone-acetaminophen 5-325 MG per tablet  Commonly known as:  NORCO/VICODIN      TAKE these medications       acetaminophen 500 MG tablet  Commonly known as:  TYLENOL  Take 500 mg by mouth every 6 (six) hours as needed for pain.     bifidobacterium infantis capsule  Take 1 capsule by mouth daily.     cholecalciferol 1000 UNITS tablet  Commonly known as:  VITAMIN D  Take 5,000 Units by mouth once a week. On Wednesdays     levofloxacin 500 MG tablet  Commonly known as:  LEVAQUIN  Take 1 tablet (500 mg total) by mouth daily.     levothyroxine 75 MCG tablet  Commonly known as:  SYNTHROID, LEVOTHROID  Take 75 mcg by mouth every morning.     lidocaine-prilocaine cream  Commonly known as:  EMLA     loperamide 2 MG capsule  Commonly known as:  IMODIUM  Take 2 mg by mouth 4 (four) times daily as needed for diarrhea or loose stools.     loratadine 10 MG tablet  Commonly known as:  CLARITIN  Take 10 mg by mouth every morning.     LORazepam 0.5 MG tablet  Commonly known as:  ATIVAN  Take 1 tablet (0.5 mg total) by mouth every 6 (six) hours as needed (Nausea or vomiting).     metoprolol succinate 25 MG 24 hr tablet  Commonly known as:  TOPROL-XL  Take 1 tablet (25 mg total) by mouth daily.     ondansetron 8 MG tablet  Commonly known as:  ZOFRAN  Take 8 mg by mouth every 12 (twelve) hours as needed for nausea. Take two times a day starting the day after chemo for 3 days. Then take two times a day  as needed for nausea or vomiting.     oxyCODONE-acetaminophen 5-325 MG per tablet  Commonly known as:  PERCOCET/ROXICET  1 tablet every 4 (four) hours as needed for pain.     pantoprazole 40 MG tablet  Commonly known as:  PROTONIX  Take 40 mg by mouth every morning.     rosuvastatin 10 MG tablet  Commonly known as:  CRESTOR  Take 10 mg by mouth every evening.     valACYclovir 1000 MG tablet  Commonly known as:  VALTREX  Take 1,000 mg by mouth See admin instructions. Take 100 mg twice daily in the morning and in the evening as needed  Follow-up Information   Follow up with PERINI,MARK A, MD In 2 weeks.   Contact information:   [address] [place] [address] [phone]       Follow up with Drue Second, MD In 2 days. (following test must be checked, CBC to evaluate WBC)    Contact information:   [address] [place] [address] [phone]       Follow up with Debbora Presto, MD. (As needed if symptoms worsen)    Contact information:   [address] [place] [address] [phone] cell phone [phone] (call me for any questions)       The results of significant diagnostics from this hospitalization (including imaging, microbiology, ancillary and laboratory) are listed below for reference.     Microbiology: Recent Results (from the past 240 hour(s))  CULTURE, BLOOD (ROUTINE X 2)     Status: None   Collection Time    09/22/12  4:24 PM      Result Value Range Status   Specimen Description BLOOD LEFT HAND   Final   Special Requests BOTTLES DRAWN AEROBIC AND ANAEROBIC 5CC   Final   Culture  Setup Time 09/22/2012 22:01   Final   Culture     Final   Value:        BLOOD CULTURE RECEIVED NO GROWTH TO DATE CULTURE WILL BE HELD FOR 5 DAYS BEFORE ISSUING A FINAL NEGATIVE REPORT   Report Status PENDING   Incomplete  URINE CULTURE     Status: None   Collection Time    09/22/12  6:18 PM      Result Value Range Status   Specimen  Description URINE, CLEAN CATCH   Final   Special Requests Immunocompromised   Final   Culture  Setup Time 09/22/2012 22:17   Final   Colony Count NO GROWTH   Final   Culture NO GROWTH   Final   Report Status 09/23/2012 FINAL   Final     Labs: Basic Metabolic Panel:  Recent Labs Lab 09/22/12 1202 09/23/12 0553 09/24/12 0540 09/25/12 0455 09/26/12 0500  NA 134* 138 138 136 136  K 4.5 3.6 3.7 3.3* 3.9  CL 99 108 106 104 104  CO2 24 23 25 24 23   GLUCOSE 113* 108* 90 102* 89  BUN 13.4 9 9 7 6   CREATININE 0.8 0.69 0.80 0.84 0.72  CALCIUM 9.3 7.2* 8.0* 8.6 8.5   Liver Function Tests:  Recent Labs Lab 09/20/12 1017 09/22/12 1202 09/23/12 0553  AST 18 23 14   ALT 23 22 14   ALKPHOS 55 70 43  BILITOT 2.18* 2.60* 1.3*  PROT 5.9* 6.5 4.8*  ALBUMIN 3.2* 3.4* 2.2*   CBC:  Recent Labs Lab 09/20/12 1017 09/22/12 1202 09/23/12 0553 09/24/12 0900 09/25/12 0455 09/26/12 0654  WBC 30.5* 2.5* 1.7* 6.8 19.5* 29.2*  NEUTROABS 28.9* 0.9* 0.2*  --   --   --   HGB 12.2 14.1 11.3* 12.0 11.3* 11.2*  HCT 36.5 41.0 32.9* 35.1* 33.5* 33.5*  MCV 92.9 92.1 90.1 90.7 90.3 90.1  PLT 259 158 136* 143* 188 182    BNP (last 3 results)  Recent Labs  09/02/12 0650  PROBNP 1049.0*   CBG: No results found for this basename: GLUCAP,  in the last 168 hours   SIGNED: Time coordinating discharge: Over 30 minutes  Debbora Presto, MD  [facility] 09/26/2012, 8:35 AM Pager [phone]  If 7PM-7AM, please contact night-coverage [email or web address] Password [email or web address]

## 2012-09-28 ENCOUNTER — Other Ambulatory Visit (HOSPITAL_BASED_OUTPATIENT_CLINIC_OR_DEPARTMENT_OTHER): Payer: Medicare Other | Admitting: Lab

## 2012-09-28 ENCOUNTER — Other Ambulatory Visit: Payer: Self-pay | Admitting: Emergency Medicine

## 2012-09-28 ENCOUNTER — Ambulatory Visit (HOSPITAL_BASED_OUTPATIENT_CLINIC_OR_DEPARTMENT_OTHER): Payer: Medicare Other | Admitting: Adult Health

## 2012-09-28 ENCOUNTER — Encounter: Payer: Self-pay | Admitting: Adult Health

## 2012-09-28 ENCOUNTER — Telehealth: Payer: Self-pay | Admitting: *Deleted

## 2012-09-28 VITALS — BP 112/69 | HR 94 | Temp 97.6°F | Resp 20 | Ht 63.0 in | Wt 149.2 lb

## 2012-09-28 DIAGNOSIS — C50319 Malignant neoplasm of lower-inner quadrant of unspecified female breast: Secondary | ICD-10-CM

## 2012-09-28 DIAGNOSIS — C50311 Malignant neoplasm of lower-inner quadrant of right female breast: Secondary | ICD-10-CM

## 2012-09-28 DIAGNOSIS — Z170 Estrogen receptor positive status [ER+]: Secondary | ICD-10-CM

## 2012-09-28 LAB — COMPREHENSIVE METABOLIC PANEL (CC13)
AST: 33 U/L (ref 5–34)
Albumin: 3.4 g/dL — ABNORMAL LOW (ref 3.5–5.0)
BUN: 9.9 mg/dL (ref 7.0–26.0)
Calcium: 9.8 mg/dL (ref 8.4–10.4)
Chloride: 104 mEq/L (ref 98–107)
Potassium: 3.8 mEq/L (ref 3.5–5.1)

## 2012-09-28 LAB — CBC WITH DIFFERENTIAL/PLATELET
Basophils Absolute: 0.2 10*3/uL — ABNORMAL HIGH (ref 0.0–0.1)
EOS%: 0.1 % (ref 0.0–7.0)
Eosinophils Absolute: 0 10*3/uL (ref 0.0–0.5)
HGB: 13.1 g/dL (ref 11.6–15.9)
MCH: 31.4 pg (ref 25.1–34.0)
MONO#: 1.6 10*3/uL — ABNORMAL HIGH (ref 0.1–0.9)
NEUT#: 13.2 10*3/uL — ABNORMAL HIGH (ref 1.5–6.5)
RDW: 15.4 % — ABNORMAL HIGH (ref 11.2–14.5)
WBC: 17.7 10*3/uL — ABNORMAL HIGH (ref 3.9–10.3)
lymph#: 2.6 10*3/uL (ref 0.9–3.3)

## 2012-09-28 LAB — CULTURE, BLOOD (ROUTINE X 2)

## 2012-09-28 NOTE — Progress Notes (Signed)
OFFICE PROGRESS NOTE  CC  Shelia Kayser, MD [address] [place] [address] Dr. Almond Lint  Dr. 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   STAGE:  Cancer of lower-inner quadrant of female breast  Primary site: Breast (Right)  Staging method: AJCC 7th Edition  Clinical: Stage IA (T1c, N0, cM0)  Summary: Stage IA (T1c, N0, cM0)   PRIOR THERAPY: #1 patient originally presented at the [facility] with a mammogram that showed architectural distortion. Ultrasound showed a 1.2 cm mass in the 4:00 position of the right breast. MRI confirmed a solitary lesion measuring 1.6 cm up. 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  #2 patient is status post lumpectomy with sentinel lymph node biopsy the final pathology revealed a 1.7 cm invasive lobular carcinoma. Tumor was ER positive PR positive HER-2/neu negative with a Ki-67 of 12% 4 sentinel nodes were negative for metastatic disease.  #3 patient had Oncotype DX testing performed her recurrence score was 29 giving her a 19% risk of distant recurrence with adjuvant hormonal therapy only. Patient and I and her family discussed her Oncotype DX testing this puts her in the intermediate risk category. We discussed side effects of chemotherapy types of chemotherapy. I would recommend Taxotere and Cytoxan every 3 weeks for 4 cycles. We can certainly begin this as soon as the Port-A-Cath is placed.  #4 patient had her first cycle of chemotherapy on 08/28/2011 She underwent two cycles of chemotherapy and subsequently developed febrile neutropenia after each cycle.  She was offered CMF instead of Taxotere/Cytoxan and declined.  She does not want any more chemotherapy.    CURRENT THERAPY:  observation  INTERVAL HISTORY: Shelia Obrien 72 y.o. female returns for follow up after  being discharged from the hospital for her second occurrence of febrile neutropenia related to her chemotherapy.  She's doing well and recovering.  She remains weak and tired, but otherwise denies any other concerns.  She is very uncertain about receiving radiation in the future.  Otherwise a 10 point ROS is neg.    MEDICAL HISTORY: Past Medical History  Diagnosis Date  . Bronchitis   . Thyroid disease   . Acid reflux disease   . High cholesterol   . Breast cancer   . Seasonal allergies   . Hypothyroidism   . Leaking of urine     Dribbles if coughs. Pt wears pad  . Epistaxis     went to [facility] ER  . Shortness of breath   . PONV (postoperative nausea and vomiting)   . Wears glasses   . Arthritis     ALLERGIES:  has No Known Allergies.  MEDICATIONS:  Current Outpatient Prescriptions  Medication Sig Dispense Refill  . acetaminophen (TYLENOL) 500 MG tablet Take 500 mg by mouth every 6 (six) hours as needed for pain.      . bifidobacterium infantis (ALIGN) capsule Take 1 capsule by mouth daily.      . cholecalciferol (VITAMIN D) 1000 UNITS tablet Take 5,000 Units by mouth once a week. On Wednesdays      . levofloxacin (LEVAQUIN) 500 MG tablet Take 1 tablet (500 mg total) by mouth daily.  5 tablet  0  . levothyroxine (SYNTHROID, LEVOTHROID) 75 MCG tablet Take 75 mcg by mouth every morning.       . lidocaine-prilocaine (EMLA)  cream       . loratadine (CLARITIN) 10 MG tablet Take 10 mg by mouth every morning.      . metoprolol succinate (TOPROL-XL) 25 MG 24 hr tablet Take 1 tablet (25 mg total) by mouth daily.  30 tablet  3  . ondansetron (ZOFRAN) 8 MG tablet Take 8 mg by mouth every 12 (twelve) hours as needed for nausea. Take two times a day starting the day after chemo for 3 days. Then take two times a day as needed for nausea or vomiting.      . pantoprazole (PROTONIX) 40 MG tablet Take 40 mg by mouth every morning.       . rosuvastatin (CRESTOR) 10 MG tablet Take 10 mg by mouth  every evening.       . valACYclovir (VALTREX) 1000 MG tablet Take 1,000 mg by mouth See admin instructions. Take 100 mg twice daily in the morning and in the evening as needed      . HYDROcodone-acetaminophen (NORCO/VICODIN) 5-325 MG per tablet       . loperamide (IMODIUM) 2 MG capsule Take 2 mg by mouth 4 (four) times daily as needed for diarrhea or loose stools.      Marland Kitchen LORazepam (ATIVAN) 0.5 MG tablet Take 1 tablet (0.5 mg total) by mouth every 6 (six) hours as needed (Nausea or vomiting).  30 tablet  0  . oxyCODONE-acetaminophen (PERCOCET/ROXICET) 5-325 MG per tablet 1 tablet every 4 (four) hours as needed for pain.        No current facility-administered medications for this visit.   Facility-Administered Medications Ordered in Other Visits  Medication Dose Route Frequency Provider Last Rate Last Dose  . 0.9 %  sodium chloride infusion   Intravenous Continuous Augustin Schooling, NP 500 mL/hr at 09/22/12 1345      SURGICAL HISTORY:  Past Surgical History  Procedure Laterality Date  . Abdominal hysterectomy    . Cholecystectomy    . Upper gi endoscopy    . Colonoscopy    . Knee arthroscopy Left   . Nasal septum surgery    . Breast lumpectomy with needle localization and axillary sentinel lymph node bx Right 07/22/2012    Procedure: BREAST LUMPECTOMY WITH NEEDLE LOCALIZATION AND AXILLARY SENTINEL LYMPH NODE BX;  Surgeon: Almond Lint, MD;  Location: [facility] OR;  Service: General;  Laterality: Right;  . Portacath placement Left 08/25/2012    Procedure: INSERTION PORT-A-CATH;  Surgeon: Almond Lint, MD;  Location: [facility];  Service: General;  Laterality: Left;    REVIEW OF SYSTEMS: General: fatigue (+), night sweats (+), fever (-), pain (-) Lymph: palpable nodes (-) HEENT: vision changes (-), mucositis (-), gum bleeding (-), epistaxis (-) Cardiovascular: chest pain (-), palpitations (-) Pulmonary: shortness of breath (-), dyspnea on exertion (-), cough (-), hemoptysis  (-) GI:  Early satiety (-), melena (-), dysphagia (-), nausea/vomiting (+), diarrhea (-) GU: dysuria (-), hematuria (-), incontinence (-) Musculoskeletal: joint swelling (-), joint pain (-), back pain (-) Neuro: weakness (-), numbness (-), headache (-), confusion (-) Skin: Rash (-), lesions (-), dryness (-) Psych: depression (-), suicidal/homicidal ideation (-), feeling of hopelessness (-)  Health Maintenance  Colonoscopy: 05/10/2010 Bone Density Scan: 07/24/11 Pap Smear: partial hysterectomy January 1977,  Eye Exam: due Vitamin D Level: checked annually Lipid Panel: checked annually   PHYSICAL EXAMINATION: Blood pressure 112/69, pulse 94, temperature 97.6 F (36.4 C), temperature source Oral, resp. rate 20, height 5\' 3"  (1.6 m), weight 149 lb 3.2 oz (67.677  kg). Body mass index is 26.44 kg/(m^2). General: Patient is a well appearing female in no acute distress HEENT: PERRLA, sclerae anicteric no conjunctival pallor, MMM Neck: supple, no palpable adenopathy Lungs: clear to auscultation bilaterally, no wheezes, rhonchi, or rales Cardiovascular: regular rate rhythm, S1, S2, no murmurs, rubs or gallops Abdomen: Soft, non-tender, non-distended, normoactive bowel sounds, no HSM Extremities: warm and well perfused, no clubbing, cyanosis, or edema Skin: No rashes or lesions Neuro: Non-focal Breasts: right breast- lumpectomy site no nodularity/mass, left breast no nodularity/mass ECOG PERFORMANCE STATUS: 0 - Asymptomatic    LABORATORY DATA: Lab Results  Component Value Date   WBC 17.7* 09/28/2012   HGB 13.1 09/28/2012   HCT 38.0 09/28/2012   MCV 91.1 09/28/2012   PLT 195 09/28/2012      Chemistry      Component Value Date/Time   NA 139 09/28/2012 1347   NA 136 09/26/2012 0500   K 3.8 09/28/2012 1347   K 3.9 09/26/2012 0500   CL 104 09/28/2012 1347   CL 104 09/26/2012 0500   CO2 22 09/28/2012 1347   CO2 23 09/26/2012 0500   BUN 9.9 09/28/2012 1347   BUN 6 09/26/2012 0500   CREATININE  1.0 09/28/2012 1347   CREATININE 0.72 09/26/2012 0500      Component Value Date/Time   CALCIUM 9.8 09/28/2012 1347   CALCIUM 8.5 09/26/2012 0500   ALKPHOS 89 09/28/2012 1347   ALKPHOS 43 09/23/2012 0553   AST 33 09/28/2012 1347   AST 14 09/23/2012 0553   ALT 34 09/28/2012 1347   ALT 14 09/23/2012 0553   BILITOT 0.62 09/28/2012 1347   BILITOT 1.3* 09/23/2012 0553     ADDITIONAL INFORMATION: 1. A sample (block 1A) was sent to [facility] for Oncotype testing. The patient's recurrence score is 29. Those patients who had a recurrence score of 29 had an average rate of distant recurrence of 19%. (JBK:caf 08/06/12) Pecola Leisure MD Pathologist, Electronic Signature ( Signed 08/06/2012) 1. CHROMOGENIC IN-SITU HYBRIDIZATION Results: HER-2/NEU BY CISH - NO AMPLIFICATION OF HER-2 DETECTED. RESULT RATIO OF HER2: CEP 17 SIGNALS 0.94 AVERAGE HER2 COPY NUMBER PER CELL 2.20 REFERENCE RANGE NEGATIVE HER2/Chr17 Ratio <2.0 and Average HER2 copy number <4.0 EQUIVOCAL HER2/Chr17 Ratio <2.0 and Average HER2 copy number 4.0 and <6.0 POSITIVE HER2/Chr17 Ratio >=2.0 and/or Average HER2 copy number >=6.0I Jimmy Picket MD Pathologist, Electronic Signature ( Signed 07/30/2012) FINAL DIAGNOSIS Diagnosis 1. Breast, lumpectomy, right 1 of 4 FINAL for YASMIN, BRONAUGH D [phone]) Diagnosis(continued) - INVASIVE LOBULAR CARCINOMA, GRADE II/III, SPANNING 1.7 CM. - LOBULAR CARCINOMA IN SITU. - PERINEURAL INVASION IS IDENTIFIED. - THE SURGICAL RESECTION MARGINS ARE NEGATIVE FOR CARCINOMA. - SEE ONCOLOGY TABLE BELOW. 2. Lymph node, sentinel, biopsy, #1 right axilla - THERE IS NO EVIDENCE OF CARCINOMA IN 1 OF 1 LYMPH NODE (0/1). 3. Lymph node, sentinel, biopsy, #2 right axilla - THERE IS NO EVIDENCE OF CARCINOMA IN 1 OF 1 LYMPH NODE (0/1). 4. Lymph node, sentinel, biopsy, #3, right axilla - THERE IS NO EVIDENCE OF CARCINOMA IN 1 OF 1 LYMPH NODE (0/1). 5. Lymph node, sentinel, biopsy, #4, right axilla - THERE IS  NO EVIDENCE OF CARCINOMA IN 1 OF 1 LYMPH NODE (0/1). Microscopic Comment 1. BREAST, INVASIVE TUMOR, WITH LYMPH NODE SAMPLING Specimen, including laterality: Right breast. Procedure: Lumpectomy. Grade: II. Tubule formation: 3. Nuclear pleomorphism: 2. Mitotic: 1. Tumor size (gross measurement): 1.7 cm. Margins: Invasive, distance to closest margin: 0.5 cm to the anterior and superior margins (gross measurements).  Lymphovascular invasion: Not identified. Ductal carcinoma in situ: Not identified. Tumor focality: Unifocal. Treatment effect: N/A. Extent of tumor: Confined to breast parenchyma. Lymph nodes: # examined: 4. Lymph nodes with metastasis: 0. Breast prognostic profile: Case [id number]. Estrogen receptor: 93%, strong staining intensity. Progesterone receptor: 7%, strong staining intensity. HER-2/neu: No amplification was detected. The ratio was 1.04. HER-2/neu by CISH will be repeated on the current case and the results reported separately. Ki-67: 12%. Non-neoplastic breast: Healing biopsy site. TNM: pT1c, pN0. Comments: Immunohistochemical stains performed on parts 2-5 fail to highlight the presence of cytokeratin positive tumor cells. (JBK:eps 07/27/12) JOSHUA  RADIOGRAPHIC STUDIES:  No results found.  ASSESSMENT: 72 year old female with  #1 new diagnosis of invasive lobular carcinoma measuring 1.7 cm node negative ER positive PR positive HER-2/neu negative with Ki-67 low. Patient had Oncotype DX testing performed which showed an intermediate risk category recurrence score. We discussed chemotherapy adjuvantly for 4 cycles. We discussed Taxotere and Cytoxan every 21 days for a total of 4 cycles. Side effects of treatment were discussed very clearly.  #2 Patient is receiving Taxotere and Cytoxan. She understands the side effects and benefits for it.  #3 she was given a prescription for a cranial prosthesis. She has all of her antiemetics. She was taught how to take  these.   PLAN:  #1  Patient will stop chemotherapy.  I have requested all of her appointments be canceled.  She and I discussed the possiblity of radiation if needed.  She agrees to make an appointment with Dr. Roselind Messier which I requested.  We also discussed adjuvant anti-estrogen therapy in the role of an aromatase inhibitor.  I gave her information to read about this.  She will make an appt with [place] after she sees Dr. Roselind Messier.     All questions were answered. The patient knows to call the clinic with any problems, questions or concerns. We can certainly see the patient much sooner if necessary.  I spent 25 minutes counseling the patient face to face. The total time spent in the appointment was 30 minutes.  This was reviewed with Dr. Welton Flakes and Dr. Welton Flakes evaluated the patient as well.   Cherie Ouch Lyn Hollingshead, NP Medical Oncology [facility] Phone: [phone]

## 2012-09-28 NOTE — Telephone Encounter (Signed)
appts made and printed...td 

## 2012-09-28 NOTE — Patient Instructions (Addendum)
We will cancel all of your chemotherapy appointments and appointments associated with chemotherapy.  Please make and keep appointment with Dr. Roselind Messier.  We will see you back after you make a decision regarding radiation therapy.  Please call us if you have any questions or concerns.      Anastrozole tablets What is this medicine? ANASTROZOLE (an AS troe zole) is used to treat breast cancer in women who have gone through menopause. Some types of breast cancer depend on estrogen to grow, and this medicine can stop tumor growth by blocking estrogen production. This medicine may be used for other purposes; ask your health care provider or pharmacist if you have questions. What should I tell my health care provider before I take this medicine? They need to know if you have any of these conditions: -liver disease -an unusual or allergic reaction to anastrozole, other medicines, foods, dyes, or preservatives -pregnant or trying to get pregnant -breast-feeding How should I use this medicine? Take this medicine by mouth with a glass of water. Follow the directions on the prescription label. You can take this medicine with or without food. Take your doses at regular intervals. Do not take your medicine more often than directed. Do not stop taking except on the advice of your doctor or health care professional. Talk to your pediatrician regarding the use of this medicine in children. Special care may be needed. Overdosage: If you think you have taken too much of this medicine contact a poison control center or emergency room at once. NOTE: This medicine is only for you. Do not share this medicine with others. What if I miss a dose? If you miss a dose, take it as soon as you can. If it is almost time for your next dose, take only that dose. Do not take double or extra doses. What may interact with this medicine? Do not take this medicine with any of the following medications: -female hormones, like  estrogens or progestins and birth control pills This medicine may also interact with the following medications: -tamoxifen This list may not describe all possible interactions. Give your health care provider a list of all the medicines, herbs, non-prescription drugs, or dietary supplements you use. Also tell them if you smoke, drink alcohol, or use illegal drugs. Some items may interact with your medicine. What should I watch for while using this medicine? Visit your doctor or health care professional for regular checks on your progress. Let your doctor or health care professional know about any unusual vaginal bleeding. Do not treat yourself for diarrhea, nausea, vomiting or other side effects. Ask your doctor or health care professional for advice. What side effects may I notice from receiving this medicine? Side effects that you should report to your doctor or health care professional as soon as possible: -allergic reactions like skin rash, itching or hives, swelling of the face, lips, or tongue -any new or unusual symptoms -breathing problems -chest pain -leg pain or swelling -vomiting Side effects that usually do not require medical attention (report to your doctor or health care professional if they continue or are bothersome): -back or bone pain -cough, or throat infection -diarrhea or constipation -dizziness -headache -hot flashes -loss of appetite -nausea -sweating -weakness and tiredness -weight gain This list may not describe all possible side effects. Call your doctor for medical advice about side effects. You may report side effects to FDA at 1-800-FDA-1088. Where should I keep my medicine? Keep out of the reach of children. Store  at room temperature between 20 and 25 degrees C (68 and 77 degrees F). Throw away any unused medicine after the expiration date. NOTE: This sheet is a summary. It may not cover all possible information. If you have questions about this medicine,  talk to your doctor, pharmacist, or health care provider.  2013, [employer]vier/[employer] Standard. (06/11/2007 4:31:52 PM)

## 2012-09-29 ENCOUNTER — Telehealth: Payer: Self-pay | Admitting: Dietician

## 2012-09-30 ENCOUNTER — Ambulatory Visit: Payer: Medicare Other | Admitting: Oncology

## 2012-09-30 ENCOUNTER — Other Ambulatory Visit: Payer: Medicare Other | Admitting: Lab

## 2012-10-04 NOTE — Progress Notes (Signed)
ATTENDING'S ATTESTATION:  I personally reviewed patient's chart, examined patient myself, formulated the treatment plan as followed.    patient was discharge from the hospital. She is feeling better,she is accompanied by her son. She is declining any chemotherapy at this point. She understands the risks and accpets them. Her son concurs with her today.  We will refer to RT  After completion of RT we will begin her on anti-estrogen therapy.  Drue Second, MD Medical/Oncology [facility] [phone] (beeper) [phone] (Office)

## 2012-10-04 NOTE — Progress Notes (Signed)
ATTENDING'S ATTESTATION:  I personally reviewed patient's chart, examined patient myself, formulated the treatment plan as followed.    [facility]. This is in spite of the fact that she had dose reduction in her chemotherapy  Patient tells me that she does not want anymore chemotherapy. But we will make this decision once she is discharged from the hospital and she is feeling better  Drue Second, MD Medical/Oncology [facility] [phone] (beeper) [phone] (Office)

## 2012-10-08 ENCOUNTER — Ambulatory Visit: Payer: Medicare Other | Admitting: Oncology

## 2012-10-08 ENCOUNTER — Ambulatory Visit: Payer: Medicare Other

## 2012-10-08 ENCOUNTER — Other Ambulatory Visit: Payer: Medicare Other | Admitting: Lab

## 2012-10-09 ENCOUNTER — Ambulatory Visit: Payer: Medicare Other

## 2012-10-12 ENCOUNTER — Other Ambulatory Visit: Payer: Self-pay | Admitting: Emergency Medicine

## 2012-10-14 ENCOUNTER — Encounter: Payer: Self-pay | Admitting: Radiation Oncology

## 2012-10-14 DIAGNOSIS — C50919 Malignant neoplasm of unspecified site of unspecified female breast: Secondary | ICD-10-CM | POA: Insufficient documentation

## 2012-10-14 NOTE — Progress Notes (Signed)
Location of Breast Cancer: right, lower inner  Histology per Pathology Report: 1. Breast, lumpectomy, right  1 of 4  FINAL for Shelia Obrien, Shelia Obrien [phone])  Diagnosis(continued)  - INVASIVE LOBULAR CARCINOMA, GRADE II/III, SPANNING 1.7 CM.  - LOBULAR CARCINOMA IN SITU.  - PERINEURAL INVASION IS IDENTIFIED.  - THE SURGICAL RESECTION MARGINS ARE NEGATIVE FOR CARCINOMA.  - SEE ONCOLOGY TABLE BELOW.  2. Lymph node, sentinel, biopsy, #1 right axilla  - THERE IS NO EVIDENCE OF CARCINOMA IN 1 OF 1 LYMPH NODE (0/1).  3. Lymph node, sentinel, biopsy, #2 right axilla  - THERE IS NO EVIDENCE OF CARCINOMA IN 1 OF 1 LYMPH NODE (0/1).  4. Lymph node, sentinel, biopsy, #3, right axilla  - THERE IS NO EVIDENCE OF CARCINOMA IN 1 OF 1 LYMPH NODE (0/1).  5. Lymph node, sentinel, biopsy, #4, right axilla  - THERE IS NO EVIDENCE OF CARCINOMA IN 1 OF 1 LYMPH NODE (0/1).   Receptor Status: ER(+), PR (+), Her2-neu (-)  Did patient present with symptoms (if so, please note symptoms) or was this found on screening mammography?: screening  Past/Anticipated interventions by surgeon, if any: right lumpectomy  Past/Anticipated interventions by medical oncology, if any: Chemotherapy Taxotere/Cytoxan s/p 2 cycles , febrile neutropenia after each chemo, declines more chemotherapy. Last chemo 09/17/12  Lymphedema issues, if any:  no   Pain issues, if any:  Dull contstant ache at base of neck x 2 weeks, Tylenol ineffective, ice pack gives fair relief  SAFETY ISSUES:  Prior radiation? no  Pacemaker/ICD? no  Possible current pregnancy? no Is the patient on methotrexate? No  Current Complaints / other details:  Dull constant ache at base of neck x 2 weeks, Tylenol ineffective, ice pack gives fair relief. Denies right breast pain, fatigue, loss of appetite. Edema in bilat feet which increases during day.    Glennie Hawk, RN 10/14/2012,7:46 AM

## 2012-10-18 ENCOUNTER — Ambulatory Visit
Admission: RE | Admit: 2012-10-18 | Discharge: 2012-10-18 | Disposition: A | Discharge status: Home / Self Care | Payer: Medicare Other | Source: Ambulatory Visit | Attending: Radiation Oncology | Admitting: Radiation Oncology

## 2012-10-18 ENCOUNTER — Other Ambulatory Visit (INDEPENDENT_AMBULATORY_CARE_PROVIDER_SITE_OTHER): Payer: Self-pay | Admitting: General Surgery

## 2012-10-18 ENCOUNTER — Encounter: Payer: Self-pay | Admitting: Radiation Oncology

## 2012-10-18 VITALS — BP 128/80 | HR 84 | Temp 97.6°F | Resp 20 | Wt 159.1 lb

## 2012-10-18 DIAGNOSIS — C50311 Malignant neoplasm of lower-inner quadrant of right female breast: Secondary | ICD-10-CM

## 2012-10-18 DIAGNOSIS — Z79899 Other long term (current) drug therapy: Secondary | ICD-10-CM | POA: Insufficient documentation

## 2012-10-18 DIAGNOSIS — R609 Edema, unspecified: Secondary | ICD-10-CM | POA: Insufficient documentation

## 2012-10-18 DIAGNOSIS — Z510 Encounter for antineoplastic radiation therapy: Secondary | ICD-10-CM | POA: Insufficient documentation

## 2012-10-18 DIAGNOSIS — C50319 Malignant neoplasm of lower-inner quadrant of unspecified female breast: Secondary | ICD-10-CM | POA: Insufficient documentation

## 2012-10-18 DIAGNOSIS — Y842 Radiological procedure and radiotherapy as the cause of abnormal reaction of the patient, or of later complication, without mention of misadventure at the time of the procedure: Secondary | ICD-10-CM | POA: Insufficient documentation

## 2012-10-18 DIAGNOSIS — L589 Radiodermatitis, unspecified: Secondary | ICD-10-CM | POA: Insufficient documentation

## 2012-10-18 NOTE — Progress Notes (Signed)
Please see the Nurse Progress Note in the MD Initial Consult Encounter for this patient. 

## 2012-10-18 NOTE — Progress Notes (Signed)
Radiation Oncology         [phone] ________________________________  Name: Shelia Obrien MRN: [id number]  Date: 10/18/2012  DOB: 05-18-1940  Follow-Up Visit Note  CC: [name], MD  Victorino December, MD  Diagnosis:   Stage I invasive lobular  carcinoma of the right breast                       Cancer of lower-inner quadrant of female breast         Primary site: Breast (Right)         Staging method: AJCC 7th Edition         Clinical: Stage IA (T1c, N0, cM0)         Summary: Stage IA (T1c, N0, cM0)     Narrative:  The patient returns today for further evaluation. The patient was initially seen in the multidisciplinary breast clinic on 07/07/2012. Since that time the patient proceeded to undergo definitive surgery under the direction of Dr. Donell Beers. Patient underwent a partial mastectomy and sentinel node procedure. Upon pathologic review the patient was found to have an invasive lobular carcinoma grade 2/3 spanning 1.7 cm. There was perineural invasion noted. 4 benign lymph nodes were removed from the right axilla. Oncotype DX testing showed a high risk for recurrence and the patient proceeded to undergo 2 cycles of adjuvant chemotherapy. Patient developed febrile neutropenia requiring admission to the hospital. Patient elected not to proceed with any further adjuvant chemotherapy in light of this issue.  The patient is now seen to coordinate her radiation therapy as breast conservation therapy. She denies any pain in the right breast area,  nipple discharge or bleeding. She denies any swelling in her right arm or hand.                              ALLERGIES:  has No Known Allergies.  Meds: Current Outpatient Prescriptions  Medication Sig Dispense Refill  . acetaminophen (TYLENOL) 500 MG tablet Take 500 mg by mouth every 6 (six) hours as needed for pain.      . bifidobacterium infantis (ALIGN) capsule Take 1 capsule by mouth daily.      . cholecalciferol (VITAMIN D) 1000 UNITS  tablet Take 5,000 Units by mouth once a week. On Wednesdays      . HYDROcodone-acetaminophen (NORCO/VICODIN) 5-325 MG per tablet       . levofloxacin (LEVAQUIN) 500 MG tablet Take 1 tablet (500 mg total) by mouth daily.  5 tablet  0  . levothyroxine (SYNTHROID, LEVOTHROID) 75 MCG tablet Take 75 mcg by mouth every morning.       . lidocaine-prilocaine (EMLA) cream       . loperamide (IMODIUM) 2 MG capsule Take 2 mg by mouth 4 (four) times daily as needed for diarrhea or loose stools.      Marland Kitchen loratadine (CLARITIN) 10 MG tablet Take 10 mg by mouth every morning.      Marland Kitchen LORazepam (ATIVAN) 0.5 MG tablet Take 1 tablet (0.5 mg total) by mouth every 6 (six) hours as needed (Nausea or vomiting).  30 tablet  0  . metoprolol succinate (TOPROL-XL) 25 MG 24 hr tablet Take 1 tablet (25 mg total) by mouth daily.  30 tablet  3  . ondansetron (ZOFRAN) 8 MG tablet Take 8 mg by mouth every 12 (twelve) hours as needed for nausea. Take two times a day starting the day  after chemo for 3 days. Then take two times a day as needed for nausea or vomiting.      Marland Kitchen oxyCODONE-acetaminophen (PERCOCET/ROXICET) 5-325 MG per tablet 1 tablet every 4 (four) hours as needed for pain.       . pantoprazole (PROTONIX) 40 MG tablet Take 40 mg by mouth every morning.       . rosuvastatin (CRESTOR) 10 MG tablet Take 10 mg by mouth every evening.       . valACYclovir (VALTREX) 1000 MG tablet Take 1,000 mg by mouth See admin instructions. Take 100 mg twice daily in the morning and in the evening as needed       No current facility-administered medications for this encounter.    Physical Findings: The patient is in no acute distress. Patient is alert and oriented.  weight is 159 lb 1.6 oz (72.167 kg). Her oral temperature is 97.6 F (36.4 C). Her blood pressure is 128/80 and her pulse is 84. Her respiration is 20. Marland Kitchen  No palpable cervical supraclavicular or axillary adenopathy. The lungs are clear to auscultation. The heart has a regular  rhythm and rate. Examination of the left breast reveals no mass or nipple discharge. Examination of the right breast area reveals a well-healed scar in the lower inner quadrant. There is no dominant mass appreciated in the breast nipple discharge or bleeding.  Lab Findings: Lab Results  Component Value Date   WBC 17.7* 09/28/2012   HGB 13.1 09/28/2012   HCT 38.0 09/28/2012   MCV 91.1 09/28/2012   PLT 195 09/28/2012      Radiographic Findings: Dg Abd Acute W/chest  09/22/2012   *RADIOLOGY REPORT*  Clinical Data: 72 year old female with nausea vomiting abdominal pain.  Decreased bowel sounds.  ACUTE ABDOMEN SERIES (ABDOMEN 2 VIEW & CHEST 1 VIEW)  Comparison: CT abdomen and pelvis 09/02/2012 and earlier.  Findings: Stable left chest Port-A-Cath.  Stable postoperative changes to the right axilla and chest wall.  Right upper quadrant surgical clips also noted.  Stable lung volumes.  No pneumothorax or pneumoperitoneum.  Cardiac size and mediastinal contours are within normal limits.  No acute pulmonary opacity.  Paucity of distal bowel gas.  Gas is seen in the mid abdominal small and large bowel loops.  No dilated bowel loops are identified.  Stable pelvic phleboliths and flank injection granulomas.  Abdominal and pelvic visceral contours are within normal limits. No acute osseous abnormality identified.  IMPRESSION: 1. Nonobstructed bowel gas pattern, no free air. 2. No acute cardiopulmonary abnormality.   Original Report Authenticated By: Erskine Speed, M.D.    Impression:  Stage I invasive lobular carcinoma of the right breast. Patient would be an excellent candidate for breast conservation with radiation therapy directed at the right breast area. With the patient's age as well as breast contour she would appear to be a candidate for hypofractionated accelerated radiation therapy. I discussed this treatment approach with the patient and her daughter. The patient does wish to proceed with this  treatment.  Plan:  Simulation and planning later this morning.  I anticipate starting radiation therapy approximately 3 weeks post chemotherapy.  _____________________________________  -----------------------------------  Billie Lade, PhD, MD

## 2012-10-19 NOTE — Progress Notes (Signed)
  Radiation Oncology         [phone]) [phone] ________________________________  Name: Shelia Obrien MRN: [id number]  Date: 10/18/2012  DOB: 1940-09-13  SIMULATION AND TREATMENT PLANNING NOTE  DIAGNOSIS:  Stage I invasive lobular carcinoma of the right breast   NARRATIVE:  The patient was brought to the CT Simulation planning suite.  Identity was confirmed.  All relevant records and images related to the planned course of therapy were reviewed.  The patient freely provided informed written consent to proceed with treatment after reviewing the details related to the planned course of therapy. The consent form was witnessed and verified by the simulation staff.  Then, the patient was set-up in a stable reproducible  supine position for radiation therapy.  CT images were obtained.  Surface markings were placed.  The CT images were loaded into the planning software.  Then the target and avoidance structures were contoured.  Treatment planning then occurred.  The radiation prescription was entered and confirmed.  Then, I designed and supervised the construction of a total of 3 medically necessary complex treatment devices.  I have requested : Isodose Plan.  I have ordered:dose calc.  PLAN:  The patient will receive 42.5 Gy in 17 fractions, followed by a boost of 7.5 gray to the lumpectomy cavity.  ________________________________  -----------------------------------  Billie Lade, PhD, MD

## 2012-10-25 ENCOUNTER — Ambulatory Visit
Admission: RE | Admit: 2012-10-25 | Discharge: 2012-10-25 | Disposition: A | Discharge status: Home / Self Care | Payer: Medicare Other | Source: Ambulatory Visit | Attending: Radiation Oncology | Admitting: Radiation Oncology

## 2012-10-25 ENCOUNTER — Telehealth: Payer: Self-pay | Admitting: Oncology

## 2012-10-25 DIAGNOSIS — C50311 Malignant neoplasm of lower-inner quadrant of right female breast: Secondary | ICD-10-CM

## 2012-10-25 NOTE — Progress Notes (Signed)
  Radiation Oncology         [phone]) [phone] ________________________________  Name: Shelia Obrien MRN: [id number]  Date: 10/25/2012  DOB: 1941/01/10  Simulation Verification Note  Status: outpatient  NARRATIVE: The patient was brought to the treatment unit and placed in the planned treatment position. The clinical setup was verified. Then port films were obtained and uploaded to the radiation oncology medical record software.  The treatment beams were carefully compared against the planned radiation fields. The position location and shape of the radiation fields was reviewed. They targeted volume of tissue appears to be appropriately covered by the radiation beams. Organs at risk appear to be excluded as planned.  Based on my personal review, I approved the simulation verification. The patient's treatment will proceed as planned.  -----------------------------------  Billie Lade, PhD, MD

## 2012-10-25 NOTE — Telephone Encounter (Signed)
pt needed to cancel flush...per having port taken on 7.22.14

## 2012-10-26 ENCOUNTER — Encounter: Payer: Self-pay | Admitting: Radiation Oncology

## 2012-10-26 ENCOUNTER — Ambulatory Visit
Admission: RE | Admit: 2012-10-26 | Discharge: 2012-10-26 | Disposition: A | Discharge status: Home / Self Care | Payer: Medicare Other | Source: Ambulatory Visit | Attending: Radiation Oncology | Admitting: Radiation Oncology

## 2012-10-26 VITALS — BP 138/78 | HR 85 | Temp 97.5°F | Resp 20 | Wt 158.9 lb

## 2012-10-26 DIAGNOSIS — C50311 Malignant neoplasm of lower-inner quadrant of right female breast: Secondary | ICD-10-CM

## 2012-10-26 MED ORDER — RADIAPLEXRX EX GEL
Freq: Once | CUTANEOUS | Status: AC
  Administered 2012-10-26: 16:00:00 via TOPICAL

## 2012-10-26 MED ORDER — ALRA NON-METALLIC DEODORANT (RAD-ONC)
1.0000 "application " | Freq: Once | TOPICAL | Status: AC
  Administered 2012-10-26: 1 via TOPICAL

## 2012-10-26 NOTE — Progress Notes (Signed)
[facility]    Radiation Oncology [address] [place]     Maryln Gottron, M.D. [place], [place] [address]               Billie Lade, M.D., Ph.D. Phone: [phone]      Molli Hazard A. Kathrynn Running, M.D. Fax: [phone]      Radene Gunning, M.D., Ph.D.         Lurline Hare, M.D.         Grayland Jack, M.D Weekly Treatment Management Note  Name: Shelia Obrien     MRN: [id number]        CSN: [id number] Date: 10/26/2012      DOB: 03/11/1941  CC: Shelia Kayser, MD         [place]    Status: Outpatient  Diagnosis: The encounter diagnosis was Cancer of lower-inner quadrant of female breast, right.  Current Dose: 2.5 Gy  Current Fraction: 1  Planned Dose: 50 Gy  Narrative: Shelia Obrien was seen today for weekly treatment management. The chart was checked and port films  were reviewed. She is tolerating the treatments well at this time without any side effects.  She has noticed swelling in her lower extremities which does improve with rest at night. I recommended she discuss this issue with her primary care physician if it continues to be a problem.  Review of patient's allergies indicates no known allergies.  Current Outpatient Prescriptions  Medication Sig Dispense Refill  . acetaminophen (TYLENOL) 500 MG tablet Take 500 mg by mouth every 6 (six) hours as needed for pain.      . cholecalciferol (VITAMIN D) 1000 UNITS tablet Take 5,000 Units by mouth once a week. On Wednesdays      . levothyroxine (SYNTHROID, LEVOTHROID) 75 MCG tablet Take 75 mcg by mouth every morning.       . lidocaine-prilocaine (EMLA) cream       . loratadine (CLARITIN) 10 MG tablet Take 10 mg by mouth every morning.      Marland Kitchen oxyCODONE-acetaminophen (PERCOCET/ROXICET) 5-325 MG per tablet 1 tablet every 4 (four) hours as needed for pain.       . pantoprazole (PROTONIX) 40 MG tablet Take 40 mg by mouth every morning.       Marland Kitchen levofloxacin (LEVAQUIN) 500 MG tablet Take 1 tablet (500 mg total) by mouth  daily.  5 tablet  0  . loperamide (IMODIUM) 2 MG capsule Take 2 mg by mouth 4 (four) times daily as needed for diarrhea or loose stools.      Marland Kitchen LORazepam (ATIVAN) 0.5 MG tablet Take 1 tablet (0.5 mg total) by mouth every 6 (six) hours as needed (Nausea or vomiting).  30 tablet  0  . rosuvastatin (CRESTOR) 10 MG tablet Take 10 mg by mouth every evening.       . valACYclovir (VALTREX) 1000 MG tablet Take 1,000 mg by mouth See admin instructions. Take 100 mg twice daily in the morning and in the evening as needed       No current facility-administered medications for this encounter.   Labs:  Lab Results  Component Value Date   WBC 17.7* 09/28/2012   HGB 13.1 09/28/2012   HCT 38.0 09/28/2012   MCV 91.1 09/28/2012   PLT 195 09/28/2012   Lab Results  Component Value Date   CREATININE 1.0 09/28/2012   BUN 9.9 09/28/2012   NA 139 09/28/2012   K 3.8 09/28/2012   CL 104 09/28/2012  CO2 22 09/28/2012   Lab Results  Component Value Date   ALT 34 09/28/2012   AST 33 09/28/2012   PHOS 1.9* 09/02/2012   BILITOT 0.62 09/28/2012    Physical Examination:  weight is 158 lb 14.4 oz (72.077 kg). Her oral temperature is 97.5 F (36.4 C). Her blood pressure is 138/78 and her pulse is 85. Her respiration is 20.    Wt Readings from Last 3 Encounters:  10/26/12 158 lb 14.4 oz (72.077 kg)  10/18/12 159 lb 1.6 oz (72.167 kg)  09/28/12 149 lb 3.2 oz (67.677 kg)    No significant radiation reaction in the right breast. Both inframammary fold area shows a maculopapular rash suspicious for yeast infection. Lungs - Normal respiratory effort, chest expands symmetrically. Lungs are clear to auscultation, no crackles or wheezes.  Heart has regular rhythm and rate  Abdomen is soft and non tender with normal bowel sounds  Assessment:  Patient tolerating treatments well  Plan: Continue treatment per original radiation prescription. Patient was given antifungal powder to place in the inframammary fold area  bilaterally.

## 2012-10-26 NOTE — Progress Notes (Signed)
Provided patient with Metronidazole antifungal powder.

## 2012-10-26 NOTE — Progress Notes (Addendum)
Weekly rad tx 1st tx completed, pt education done, alra,radiaplex gel, radiation thrapy and you book given, teach back given, pat's legs swollen and painful in feet, has tried support hose but too tight stated, no skin changes portacath to be removed next Tuesday by Dr. Donell Beers, Monday to have labs and pre-op

## 2012-10-27 ENCOUNTER — Ambulatory Visit
Admission: RE | Admit: 2012-10-27 | Discharge: 2012-10-27 | Disposition: A | Discharge status: Home / Self Care | Payer: Medicare Other | Source: Ambulatory Visit | Attending: Radiation Oncology | Admitting: Radiation Oncology

## 2012-10-27 ENCOUNTER — Encounter (HOSPITAL_COMMUNITY): Payer: Self-pay | Admitting: Pharmacy Technician

## 2012-10-27 NOTE — Progress Notes (Signed)
Echo  And chest 2 view xray 5-22-14epic Carotid doppler 09-03-12 epic ekg 09-01-12 epic

## 2012-10-27 NOTE — Patient Instructions (Addendum)
20 Shelia Obrien  10/27/2012   Your procedure is scheduled on: Tuesday 11-02-2012   Report to [facility] at 1030 AM.  Call this number if you have problems the morning of surgery [phone]   Remember:   Do not eat food  :After Midnight.   clear liquids midnight until 0700 am day of surgery, then nothing by mouth   Take these medicines the morning of surgery with A SIP OF WATER: levothyroxine, protonix, claritin                                SEE [place] PREPARING FOR SURGERY SHEET   Do not wear jewelry, make-up or nail polish.  Do not wear lotions, powders, or perfumes. You may wear deodorant.   Men may shave face and neck.  Do not bring valuables to the hospital. [facility] IS NOT RESPONSIBLE FOR VALUEABLES.  Contacts, dentures or bridgework may not be worn into surgery.  Leave suitcase in the car. After surgery it may be brought to your room.  For patients admitted to the hospital, checkout time is 11:00 AM the day of discharge.   Patients discharged the day of surgery will not be allowed to drive home.  Name and phone number of your driver:sister gloria shores cell [phone]  Special Instructions: N/A   Please read over the following fact sheets that you were given: MRSA Information.  Call Cain Sieve RN pre op nurse if needed [phone]    FAILURE TO FOLLOW THESE INSTRUCTIONS MAY RESULT IN THE CANCELLATION OF YOUR SURGERY.  PATIENT SIGNATURE___________________________________________  NURSE SIGNATURE_____________________________________________

## 2012-10-28 ENCOUNTER — Ambulatory Visit
Admission: RE | Admit: 2012-10-28 | Discharge: 2012-10-28 | Disposition: A | Discharge status: Home / Self Care | Payer: Medicare Other | Source: Ambulatory Visit | Attending: Radiation Oncology | Admitting: Radiation Oncology

## 2012-10-28 ENCOUNTER — Encounter (HOSPITAL_COMMUNITY)
Admission: RE | Admit: 2012-10-28 | Discharge: 2012-10-28 | Disposition: A | Discharge status: Home / Self Care | Payer: Medicare Other | Source: Ambulatory Visit | Attending: General Surgery | Admitting: General Surgery

## 2012-10-28 ENCOUNTER — Encounter (HOSPITAL_COMMUNITY): Payer: Self-pay

## 2012-10-28 HISTORY — DX: Essential (primary) hypertension: I10

## 2012-10-28 HISTORY — DX: Other specified soft tissue disorders: M79.89

## 2012-10-28 LAB — BASIC METABOLIC PANEL
GFR calc Af Amer: 82 mL/min — ABNORMAL LOW (ref >90)
GFR calc non Af Amer: 70 mL/min — ABNORMAL LOW (ref >90)
Glucose, Bld: 101 mg/dL — ABNORMAL HIGH (ref 70–99)
Potassium: 4.9 mEq/L (ref 3.5–5.1)
Sodium: 141 mEq/L (ref 135–145)

## 2012-10-28 LAB — CBC
Hemoglobin: 12.9 g/dL (ref 12.0–15.0)
MCHC: 33.5 g/dL (ref 30.0–36.0)
RDW: 14.3 % (ref 11.5–15.5)

## 2012-10-29 ENCOUNTER — Ambulatory Visit: Payer: Medicare Other | Admitting: Oncology

## 2012-10-29 ENCOUNTER — Other Ambulatory Visit: Payer: Medicare Other | Admitting: Lab

## 2012-10-29 ENCOUNTER — Ambulatory Visit: Payer: Medicare Other

## 2012-10-29 ENCOUNTER — Ambulatory Visit
Admission: RE | Admit: 2012-10-29 | Discharge: 2012-10-29 | Disposition: A | Discharge status: Home / Self Care | Payer: Medicare Other | Source: Ambulatory Visit | Attending: Radiation Oncology | Admitting: Radiation Oncology

## 2012-10-30 ENCOUNTER — Ambulatory Visit: Payer: Medicare Other

## 2012-11-01 ENCOUNTER — Ambulatory Visit
Admission: RE | Admit: 2012-11-01 | Discharge: 2012-11-01 | Disposition: A | Discharge status: Home / Self Care | Payer: Medicare Other | Source: Ambulatory Visit | Attending: Radiation Oncology | Admitting: Radiation Oncology

## 2012-11-02 ENCOUNTER — Ambulatory Visit
Admission: RE | Admit: 2012-11-02 | Discharge: 2012-11-02 | Disposition: A | Discharge status: Home / Self Care | Payer: Medicare Other | Source: Ambulatory Visit | Attending: Radiation Oncology | Admitting: Radiation Oncology

## 2012-11-02 ENCOUNTER — Ambulatory Visit (HOSPITAL_COMMUNITY)
Admission: RE | Admit: 2012-11-02 | Discharge: 2012-11-02 | Disposition: A | Discharge status: Home / Self Care | Payer: Medicare Other | Source: Ambulatory Visit | Attending: General Surgery | Admitting: General Surgery

## 2012-11-02 ENCOUNTER — Encounter (HOSPITAL_COMMUNITY)
Admission: RE | Disposition: A | Discharge status: Home / Self Care | Payer: Self-pay | Source: Ambulatory Visit | Attending: General Surgery

## 2012-11-02 ENCOUNTER — Encounter (HOSPITAL_COMMUNITY): Payer: Self-pay | Admitting: Anesthesiology

## 2012-11-02 ENCOUNTER — Encounter: Payer: Self-pay | Admitting: Radiation Oncology

## 2012-11-02 ENCOUNTER — Ambulatory Visit (HOSPITAL_COMMUNITY): Payer: Medicare Other | Admitting: Anesthesiology

## 2012-11-02 ENCOUNTER — Encounter (HOSPITAL_COMMUNITY): Payer: Self-pay

## 2012-11-02 VITALS — BP 128/66 | HR 84 | Temp 98.1°F | Resp 20 | Wt 157.7 lb

## 2012-11-02 DIAGNOSIS — Z79899 Other long term (current) drug therapy: Secondary | ICD-10-CM | POA: Insufficient documentation

## 2012-11-02 DIAGNOSIS — C50311 Malignant neoplasm of lower-inner quadrant of right female breast: Secondary | ICD-10-CM

## 2012-11-02 DIAGNOSIS — Z01812 Encounter for preprocedural laboratory examination: Secondary | ICD-10-CM | POA: Insufficient documentation

## 2012-11-02 DIAGNOSIS — I10 Essential (primary) hypertension: Secondary | ICD-10-CM | POA: Insufficient documentation

## 2012-11-02 DIAGNOSIS — Z452 Encounter for adjustment and management of vascular access device: Secondary | ICD-10-CM | POA: Insufficient documentation

## 2012-11-02 DIAGNOSIS — C50919 Malignant neoplasm of unspecified site of unspecified female breast: Secondary | ICD-10-CM | POA: Insufficient documentation

## 2012-11-02 DIAGNOSIS — E039 Hypothyroidism, unspecified: Secondary | ICD-10-CM | POA: Insufficient documentation

## 2012-11-02 HISTORY — PX: PORT-A-CATH REMOVAL: SHX5289

## 2012-11-02 SURGERY — REMOVAL PORT-A-CATH
Anesthesia: Monitor Anesthesia Care | Site: Chest | Wound class: Clean

## 2012-11-02 MED ORDER — LACTATED RINGERS IV SOLN
INTRAVENOUS | Status: DC
  Administered 2012-11-02: 1000 mL via INTRAVENOUS

## 2012-11-02 MED ORDER — CEFAZOLIN SODIUM-DEXTROSE 2-3 GM-% IV SOLR
2.0000 g | INTRAVENOUS | Status: AC
Start: 2012-11-02 — End: 2012-11-02
  Administered 2012-11-02: 2 g via INTRAVENOUS

## 2012-11-02 MED ORDER — CEFAZOLIN SODIUM-DEXTROSE 2-3 GM-% IV SOLR
INTRAVENOUS | Status: AC
  Filled 2012-11-02: qty 50

## 2012-11-02 MED ORDER — LIDOCAINE-EPINEPHRINE (PF) 1 %-1:200000 IJ SOLN
INTRAMUSCULAR | Status: DC | PRN
  Administered 2012-11-02: 7.5 mL

## 2012-11-02 MED ORDER — BUPIVACAINE HCL (PF) 0.25 % IJ SOLN
INTRAMUSCULAR | Status: AC
  Filled 2012-11-02: qty 30

## 2012-11-02 MED ORDER — FENTANYL CITRATE 0.05 MG/ML IJ SOLN: 25.0000 ug | INTRAMUSCULAR | Status: DC | PRN

## 2012-11-02 MED ORDER — PROPOFOL INFUSION 10 MG/ML OPTIME
INTRAVENOUS | Status: DC | PRN
  Administered 2012-11-02: 140 ug/kg/min via INTRAVENOUS

## 2012-11-02 MED ORDER — LACTATED RINGERS IV SOLN: INTRAVENOUS | Status: DC

## 2012-11-02 MED ORDER — LIDOCAINE-EPINEPHRINE 1 %-1:100000 IJ SOLN
INTRAMUSCULAR | Status: AC
  Filled 2012-11-02: qty 1

## 2012-11-02 MED ORDER — BUPIVACAINE-EPINEPHRINE PF 0.25-1:200000 % IJ SOLN
INTRAMUSCULAR | Status: DC | PRN
  Administered 2012-11-02: 7.5 mL

## 2012-11-02 MED ORDER — HYDROCODONE-ACETAMINOPHEN 5-325 MG PO TABS: 1.0000 | ORAL_TABLET | ORAL | Status: DC | PRN

## 2012-11-02 SURGICAL SUPPLY — 37 items
ADH SKN CLS APL DERMABOND .7 (GAUZE/BANDAGES/DRESSINGS)
APL SKNCLS STERI-STRIP NONHPOA (GAUZE/BANDAGES/DRESSINGS) ×1
BENZOIN TINCTURE PRP APPL 2/3 (GAUZE/BANDAGES/DRESSINGS) ×2 IMPLANT
BLADE SURG 15 STRL LF DISP TIS (BLADE) ×1 IMPLANT
BLADE SURG 15 STRL SS (BLADE) ×2
CANISTER SUCTION 2500CC (MISCELLANEOUS) ×2 IMPLANT
CHLORAPREP W/TINT 10.5 ML (MISCELLANEOUS) ×2 IMPLANT
CLOTH BEACON ORANGE TIMEOUT ST (SAFETY) ×2 IMPLANT
DECANTER SPIKE VIAL GLASS SM (MISCELLANEOUS) ×2 IMPLANT
DERMABOND ADVANCED (GAUZE/BANDAGES/DRESSINGS)
DERMABOND ADVANCED .7 DNX12 (GAUZE/BANDAGES/DRESSINGS) IMPLANT
DRAPE LAPAROTOMY TRNSV 102X78 (DRAPE) IMPLANT
ELECT COATED BLADE 2.86 ST (ELECTRODE) IMPLANT
ELECT REM PT RETURN 9FT ADLT (ELECTROSURGICAL) ×2
ELECTRODE REM PT RTRN 9FT ADLT (ELECTROSURGICAL) ×1 IMPLANT
GAUZE SPONGE 4X4 16PLY XRAY LF (GAUZE/BANDAGES/DRESSINGS) IMPLANT
GLOVE BIO SURGEON STRL SZ 6 (GLOVE) ×2 IMPLANT
GLOVE BIO SURGEON STRL SZ 6.5 (GLOVE) IMPLANT
GLOVE BIOGEL PI IND STRL 6.5 (GLOVE) ×1 IMPLANT
GLOVE BIOGEL PI IND STRL 7.0 (GLOVE) ×1 IMPLANT
GLOVE BIOGEL PI INDICATOR 6.5 (GLOVE) ×1
GLOVE BIOGEL PI INDICATOR 7.0 (GLOVE) ×1
GLOVE INDICATOR 6.5 STRL GRN (GLOVE) ×4 IMPLANT
GOWN PREVENTION PLUS XXLARGE (GOWN DISPOSABLE) ×2 IMPLANT
KIT BASIN OR (CUSTOM PROCEDURE TRAY) ×2 IMPLANT
MARKER SKIN DUAL TIP RULER LAB (MISCELLANEOUS) ×2 IMPLANT
NEEDLE HYPO 22GX1.5 SAFETY (NEEDLE) IMPLANT
NEEDLE HYPO 25X1 1.5 SAFETY (NEEDLE) ×2 IMPLANT
PACK BASIC VI WITH GOWN DISP (CUSTOM PROCEDURE TRAY) ×2 IMPLANT
PENCIL BUTTON HOLSTER BLD 10FT (ELECTRODE) ×2 IMPLANT
SPONGE GAUZE 4X4 12PLY (GAUZE/BANDAGES/DRESSINGS) ×2 IMPLANT
STRIP CLOSURE SKIN 1/2X4 (GAUZE/BANDAGES/DRESSINGS) ×2 IMPLANT
SUT MNCRL AB 4-0 PS2 18 (SUTURE) ×2 IMPLANT
SUT VIC AB 3-0 SH 27 (SUTURE)
SUT VIC AB 3-0 SH 27X BRD (SUTURE) IMPLANT
SYR CONTROL 10ML LL (SYRINGE) ×2 IMPLANT
TOWEL OR 17X26 10 PK STRL BLUE (TOWEL DISPOSABLE) ×2 IMPLANT

## 2012-11-02 NOTE — Progress Notes (Signed)
Pt states she had mid back pain yesterday, took Hydrocodone 1/2 tab w/good relief, denies pain today. She is having her chest porta cath removed this afternoon. Applying Radiaplex to right breast treatment area, no skin changes reported. Pt is fatigued, no loss of appetite.

## 2012-11-02 NOTE — Anesthesia Postprocedure Evaluation (Signed)
  Anesthesia Post-op Note  Patient: Shelia Obrien  Procedure(s) Performed: Procedure(s) (LRB): REMOVAL PORT-A-CATH (N/A)  Patient Location: PACU  Anesthesia Type: MAC  Level of Consciousness: awake and alert   Airway and Oxygen Therapy: Patient Spontanous Breathing  Post-op Pain: mild  Post-op Assessment: Post-op Vital signs reviewed, Patient's Cardiovascular Status Stable, Respiratory Function Stable, Patent Airway and No signs of Nausea or vomiting  Last Vitals:  Filed Vitals:   11/02/12 1515  BP: 126/55  Pulse: 86  Temp:   Resp: 13    Post-op Vital Signs: stable   Complications: No apparent anesthesia complications

## 2012-11-02 NOTE — Transfer of Care (Signed)
Immediate Anesthesia Transfer of Care Note  Patient: Shelia Obrien  Procedure(s) Performed: Procedure(s): REMOVAL PORT-A-CATH (N/A)  Patient Location: PACU  Anesthesia Type:MAC  Level of Consciousness: awake, alert , oriented and patient cooperative  Airway & Oxygen Therapy: Patient Spontanous Breathing and Patient connected to face mask oxygen  Post-op Assessment: Report given to PACU RN and Post -op Vital signs reviewed and stable  Post vital signs: Reviewed and stable  Complications: No apparent anesthesia complications

## 2012-11-02 NOTE — Op Note (Signed)
  PRE-OPERATIVE DIAGNOSIS:  un-needed Port-A-Cath for breast cancer  POST-OPERATIVE DIAGNOSIS:  Same   PROCEDURE:  Procedure(s):  REMOVAL PORT-A-CATH  SURGEON:  Surgeon(s):  Almond Lint, MD  ANESTHESIA:   MAC + local  EBL:   Minimal  SPECIMEN:  None  Complications : none known  Procedure:   Pt was  identified in the holding area and taken to the operating room where she was placed supine on the operating room table.  General anesthesia was induced via LMA.  The R upper chest was prepped and draped.  The prior incision was anesthetized with local anesthetic.  The incision was opened with a #15 blade.  The subcutaneous tissue was divided with the cautery.  The port was identified and the capsule opened.  The four 2-0 prolene sutures were removed.  The port was then removed and pressure held on the tract.  The catheter appeared intact without evidence of breakage.  The wound was inspected for hemostasis, which was achieved with cautery.  The wound was closed with 3-0 vicryl deep dermal interrupted sutures and 4-0 Monocryl running subcuticular suture.  The wound was cleaned, dried, and dressed with dermabond.  The patient was awakened from anesthesia and taken to the PACU in stable condition.  Needle, sponge, and instrument counts are correct.

## 2012-11-02 NOTE — Progress Notes (Signed)
[facility]    Radiation Oncology [address] [place]     Maryln Gottron, M.D. [place], [place] [address]               Billie Lade, M.D., Ph.D. Phone: [phone]      Molli Hazard A. Kathrynn Running, M.D. Fax: [phone]      Radene Gunning, M.D., Ph.D.         Lurline Hare, M.D.         Grayland Jack, M.D Weekly Treatment Management Note  Name: Shelia Obrien     MRN: [id number]        CSN: [id number] Date: 11/02/2012      DOB: May 21, 1940  CC: [name], MD         [place]    Status: Outpatient  Diagnosis: The encounter diagnosis was Cancer of lower-inner quadrant of female breast, right.  Current Dose: 15 Gy  Current Fraction: 6  Planned Dose: 50 Gy  Narrative: [name] was seen today for weekly treatment management. The chart was checked and port films  were reviewed. She is tolerating her treatments well at this time. She has noticed some mild itching in the inframammary fold area. Patient was given antifungal powder last week which has not changed the dermatitis on either side. Patient will try non-adherent dressing in the inframammary fold areas tonight. She is having problems with hot flashes which contribute to the moisture in this area.  Review of patient's allergies indicates no known allergies.  Current Outpatient Prescriptions  Medication Sig Dispense Refill  . acetaminophen (TYLENOL) 500 MG tablet Take 500 mg by mouth every 6 (six) hours as needed for pain.      . hyaluronate sodium (RADIAPLEXRX) GEL Apply topically 2 (two) times daily. Right whole breast due to radiation tx      . HYDROcodone-acetaminophen (NORCO/VICODIN) 5-325 MG per tablet Take 1 tablet by mouth every 6 (six) hours as needed for pain.      Marland Kitchen levothyroxine (SYNTHROID, LEVOTHROID) 75 MCG tablet Take 75 mcg by mouth every morning.       . lidocaine-prilocaine (EMLA) cream Apply 1 application topically as needed (for port-a-cath access).       Marland Kitchen loratadine (CLARITIN) 10 MG tablet  Take 10 mg by mouth every morning.      . miconazole (MICRO GUARD) 2 % powder Apply topically as needed for itching. Under both breasts to rash      . pantoprazole (PROTONIX) 40 MG tablet Take 40 mg by mouth every morning.       . rosuvastatin (CRESTOR) 10 MG tablet Take 10 mg by mouth every evening.       . valACYclovir (VALTREX) 1000 MG tablet Take 1,000 mg by mouth 2 (two) times daily as needed (for cold sores).       . Vitamin D, Ergocalciferol, (DRISDOL) 50000 UNITS CAPS Take 50,000 Units by mouth every 7 (seven) days. Taken on Wednesdays.       No current facility-administered medications for this encounter.   Labs:  Lab Results  Component Value Date   WBC 6.3 10/28/2012   HGB 12.9 10/28/2012   HCT 38.5 10/28/2012   MCV 91.7 10/28/2012   PLT 200 10/28/2012   Lab Results  Component Value Date   CREATININE 0.81 10/28/2012   BUN 10 10/28/2012   NA 141 10/28/2012   K 4.9 10/28/2012   CL 107 10/28/2012   CO2 28 10/28/2012   Lab Results  Component Value Date   ALT 34 09/28/2012   AST 33 09/28/2012   PHOS 1.9* 09/02/2012   BILITOT 0.62 09/28/2012    Physical Examination:  weight is 157 lb 11.2 oz (71.532 kg). Her temperature is 98.1 F (36.7 C). Her blood pressure is 128/66 and her pulse is 84. Her respiration is 20.    Wt Readings from Last 3 Encounters:  11/02/12 157 lb 11.2 oz (71.532 kg)  10/28/12 155 lb 9.6 oz (70.58 kg)  10/26/12 158 lb 14.4 oz (72.077 kg)    Patient has a maculopapular rash along both inframammary fold areas more so along the right side. There is minimal radiation reaction throughout the right breast at this time. Lungs - Normal respiratory effort, chest expands symmetrically. Lungs are clear to auscultation, no crackles or wheezes.  Heart has regular rhythm and rate  Abdomen is soft and non tender with normal bowel sounds  Assessment:  Patient tolerating treatments well  Plan: Continue treatment per original radiation prescription

## 2012-11-02 NOTE — H&P (Signed)
Shelia Obrien is an 72 y.o. female.   Chief Complaint: unneeded port a cath HPI:  Pt is a 72 yo F with right breast cancer.  She was unable to tolerate chemotherapy despite adjustments in regimen.  She is not going to continue  Past Medical History  Diagnosis Date  . Bronchitis   . Thyroid disease   . Acid reflux disease   . High cholesterol   . Seasonal allergies   . Hypothyroidism   . Leaking of urine     Dribbles if coughs. Pt wears pad  . Epistaxis     went to [facility]  . Shortness of breath   . PONV (postoperative nausea and vomiting)   . Wears glasses   . Arthritis   . Hypertension june 2014    no current bp meds  . Breast cancer 06/24/12    right, lower inner  . Swelling of left lower extremity     x 2 weeks, no sob  . Swelling of right lower extremity x 2 weeks    no sob    Past Surgical History  Procedure Laterality Date  . Upper gi endoscopy      last in 05/10/10  . Colonoscopy    . Knee arthroscopy Left 04/11/02  . Nasal septum surgery  04/01/95  . Breast lumpectomy with needle localization and axillary sentinel lymph node bx Right 07/22/2012    Procedure: BREAST LUMPECTOMY WITH NEEDLE LOCALIZATION AND AXILLARY SENTINEL LYMPH NODE BX;  Surgeon: Almond Lint, MD;  Location: [facility] OR;  Service: General;  Laterality: Right;  . Portacath placement Left 08/25/2012    Procedure: INSERTION PORT-A-CATH;  Surgeon: Almond Lint, MD;  Location: [facility];  Service: General;  Laterality: Left;  . Abdominal hysterectomy  04/1975    partial, heavy menses  . Cholecystectomy  06/21/96    Family History  Problem Relation Age of Onset  . Prostate cancer Father   . Cancer Father     stomach  . Breast cancer Maternal Aunt   . Lung cancer Maternal Uncle   . Lung cancer Maternal Grandmother   . Kidney cancer Maternal Grandmother   . Cirrhosis Mother   . Prostate cancer Brother   . Diabetes Brother    Social History:  reports that she has never smoked.  She has never used smokeless tobacco. She reports that she does not drink alcohol or use illicit drugs.  Allergies: No Known Allergies  Medications Prior to Admission  Medication Sig Dispense Refill  . acetaminophen (TYLENOL) 500 MG tablet Take 500 mg by mouth every 6 (six) hours as needed for pain.      . hyaluronate sodium (RADIAPLEXRX) GEL Apply topically 2 (two) times daily. Right whole breast due to radiation tx      . HYDROcodone-acetaminophen (NORCO/VICODIN) 5-325 MG per tablet Take 1 tablet by mouth every 6 (six) hours as needed for pain.      Marland Kitchen levothyroxine (SYNTHROID, LEVOTHROID) 75 MCG tablet Take 75 mcg by mouth every morning.       . loratadine (CLARITIN) 10 MG tablet Take 10 mg by mouth every morning.      . miconazole (MICRO GUARD) 2 % powder Apply topically as needed for itching. Under both breasts to rash      . pantoprazole (PROTONIX) 40 MG tablet Take 40 mg by mouth every morning.       . rosuvastatin (CRESTOR) 10 MG tablet Take 10 mg by mouth daily.       Marland Kitchen  Vitamin D, Ergocalciferol, (DRISDOL) 50000 UNITS CAPS Take 50,000 Units by mouth every 7 (seven) days. Taken on Wednesdays.      Marland Kitchen lidocaine-prilocaine (EMLA) cream Apply 1 application topically as needed (for port-a-cath access).       . valACYclovir (VALTREX) 1000 MG tablet Take 1,000 mg by mouth 2 (two) times daily as needed (for cold sores).         No results found for this or any previous visit (from the past 48 hour(s)). No results found.  Review of Systems  All other systems reviewed and are negative.    Blood pressure 135/81, pulse 82, temperature 97 F (36.1 C), temperature source Oral, resp. rate 20, SpO2 97.00%. Physical Exam  Constitutional: She is oriented to person, place, and time. She appears well-developed and well-nourished. No distress.  HENT:  Head: Normocephalic.  Alopecia   Eyes: Conjunctivae are normal. Pupils are equal, round, and reactive to light. No scleral icterus.  Neck: Neck  supple.  Cardiovascular: Normal rate.   Respiratory: Effort normal. No respiratory distress.  Left [facility] port in place   GI: Soft. She exhibits no distension. There is no tenderness.  Neurological: She is alert and oriented to person, place, and time.  Skin: Skin is warm and dry. No rash noted. She is not diaphoretic. No erythema. No pallor.  Psychiatric: She has a normal mood and affect. Her behavior is normal. Judgment and thought content normal.     Assessment/Plan  Right breast cancer Port no longer needed. Will remove.   Risks of bleeding, infection discussed with patient. Plan procedure under MAC with local.    Shelia Obrien 11/02/2012, 1:44 PM

## 2012-11-02 NOTE — Anesthesia Preprocedure Evaluation (Addendum)
Anesthesia Evaluation  Patient identified by MRN, date of birth, ID band Patient awake    Reviewed: Allergy & Precautions, H&P , NPO status , Patient's Chart, lab work & pertinent test results  History of Anesthesia Complications (+) PONV  Airway Mallampati: II TM Distance: >3 FB Neck ROM: full    Dental  (+) Caps and Dental Advisory Given 2 upper front capped:   Pulmonary neg pulmonary ROS, shortness of breath and with exertion,  breath sounds clear to auscultation  Pulmonary exam normal       Cardiovascular Exercise Tolerance: Good hypertension, negative cardio ROS  Rhythm:regular Rate:Normal  Borderline prolonged QT   Neuro/Psych negative neurological ROS  negative psych ROS   GI/Hepatic negative GI ROS, Neg liver ROS, GERD-  Medicated and Controlled,  Endo/Other  negative endocrine ROSHypothyroidism   Renal/GU negative Renal ROS  negative genitourinary   Musculoskeletal   Abdominal   Peds  Hematology negative hematology ROS (+)   Anesthesia Other Findings   Reproductive/Obstetrics negative OB ROS                          Anesthesia Physical Anesthesia Plan  ASA: II  Anesthesia Plan: MAC   Post-op Pain Management:    Induction:   Airway Management Planned: Simple Face Mask  Additional Equipment:   Intra-op Plan:   Post-operative Plan:   Informed Consent: I have reviewed the patients History and Physical, chart, labs and discussed the procedure including the risks, benefits and alternatives for the proposed anesthesia with the patient or authorized representative who has indicated his/her understanding and acceptance.   Dental Advisory Given  Plan Discussed with: CRNA and Surgeon  Anesthesia Plan Comments:         Anesthesia Quick Evaluation

## 2012-11-03 ENCOUNTER — Encounter (HOSPITAL_COMMUNITY): Payer: Self-pay | Admitting: General Surgery

## 2012-11-03 ENCOUNTER — Ambulatory Visit
Admission: RE | Admit: 2012-11-03 | Discharge: 2012-11-03 | Disposition: A | Discharge status: Home / Self Care | Payer: Medicare Other | Source: Ambulatory Visit | Attending: Radiation Oncology | Admitting: Radiation Oncology

## 2012-11-04 ENCOUNTER — Ambulatory Visit
Admission: RE | Admit: 2012-11-04 | Discharge: 2012-11-04 | Disposition: A | Discharge status: Home / Self Care | Payer: Medicare Other | Source: Ambulatory Visit | Attending: Radiation Oncology | Admitting: Radiation Oncology

## 2012-11-05 ENCOUNTER — Ambulatory Visit
Admission: RE | Admit: 2012-11-05 | Discharge: 2012-11-05 | Disposition: A | Discharge status: Home / Self Care | Payer: Medicare Other | Source: Ambulatory Visit | Attending: Radiation Oncology | Admitting: Radiation Oncology

## 2012-11-08 ENCOUNTER — Ambulatory Visit
Admission: RE | Admit: 2012-11-08 | Discharge: 2012-11-08 | Disposition: A | Discharge status: Home / Self Care | Payer: Medicare Other | Source: Ambulatory Visit | Attending: Radiation Oncology | Admitting: Radiation Oncology

## 2012-11-09 ENCOUNTER — Ambulatory Visit
Admission: RE | Admit: 2012-11-09 | Discharge: 2012-11-09 | Disposition: A | Discharge status: Home / Self Care | Payer: Medicare Other | Source: Ambulatory Visit | Attending: Radiation Oncology | Admitting: Radiation Oncology

## 2012-11-09 ENCOUNTER — Encounter: Payer: Self-pay | Admitting: Radiation Oncology

## 2012-11-09 VITALS — BP 134/65 | HR 86 | Temp 97.8°F | Resp 20 | Wt 158.8 lb

## 2012-11-09 DIAGNOSIS — C50311 Malignant neoplasm of lower-inner quadrant of right female breast: Secondary | ICD-10-CM

## 2012-11-09 NOTE — Progress Notes (Signed)
[facility]    Radiation Oncology [address] [place]     Maryln Gottron, M.D. [place], [place] [address]               Billie Lade, M.D., Ph.D. Phone: [phone]      Molli Hazard A. Kathrynn Running, M.D. Fax: [phone]      Radene Gunning, M.D., Ph.D.         Lurline Hare, M.D.         Grayland Jack, M.D Weekly Treatment Management Note  Name: Shelia Obrien     MRN: [id number]        CSN: [id number] Date: 11/09/2012      DOB: Nov 11, 1940  CC: Shelia Kayser, MD         [place]    Status: Outpatient  Diagnosis: The encounter diagnosis was Cancer of lower-inner quadrant of female breast, right.  Current Dose: 27.5 Gy  Current Fraction: 11  Planned Dose: 50 Gy  Narrative: Shelia Obrien was seen today for weekly treatment management. The chart was checked and port films  were reviewed. She has noticed improvement in her rash in the inframammary folds since being on miconazole powder.  She does have some fatigue but is not sleeping well at night.  Review of patient's allergies indicates no known allergies.  Current Outpatient Prescriptions  Medication Sig Dispense Refill  . acetaminophen (TYLENOL) 500 MG tablet Take 500 mg by mouth every 6 (six) hours as needed for pain.      . hyaluronate sodium (RADIAPLEXRX) GEL Apply topically 2 (two) times daily. Right whole breast due to radiation tx      . HYDROcodone-acetaminophen (NORCO/VICODIN) 5-325 MG per tablet Take 1 tablet by mouth every 6 (six) hours as needed for pain.      Marland Kitchen HYDROcodone-acetaminophen (NORCO/VICODIN) 5-325 MG per tablet Take 1-2 tablets by mouth every 4 (four) hours as needed for pain.  20 tablet  0  . levothyroxine (SYNTHROID, LEVOTHROID) 75 MCG tablet Take 75 mcg by mouth every morning.       . lidocaine-prilocaine (EMLA) cream Apply 1 application topically as needed (for port-a-cath access).       Marland Kitchen loratadine (CLARITIN) 10 MG tablet Take 10 mg by mouth every morning.      Marland Kitchen LORazepam (ATIVAN) 0.5 MG  tablet Take 0.5 mg by mouth every 8 (eight) hours.      . miconazole (MICRO GUARD) 2 % powder Apply topically as needed for itching. Under both breasts to rash      . pantoprazole (PROTONIX) 40 MG tablet Take 40 mg by mouth every morning.       . rosuvastatin (CRESTOR) 10 MG tablet Take 10 mg by mouth daily.       . valACYclovir (VALTREX) 1000 MG tablet Take 1,000 mg by mouth 2 (two) times daily as needed (for cold sores).       . Vitamin D, Ergocalciferol, (DRISDOL) 50000 UNITS CAPS Take 50,000 Units by mouth every 7 (seven) days. Taken on Wednesdays.       No current facility-administered medications for this encounter.      Physical Examination:  weight is 158 lb 12.8 oz (72.031 kg). Her oral temperature is 97.8 F (36.6 C). Her blood pressure is 134/65 and her pulse is 86. Her respiration is 20.    Wt Readings from Last 3 Encounters:  11/09/12 158 lb 12.8 oz (72.031 kg)  11/02/12 157 lb 11.2 oz (71.532 kg)  10/28/12 155  lb 9.6 oz (70.58 kg)    The right breast area shows mild hyperpigmentation changes and erythema. The inframammary fold rashes are improved. There continues to be more significant involvement along the right side. Lungs - Normal respiratory effort, chest expands symmetrically. Lungs are clear to auscultation, no crackles or wheezes.  Heart has regular rhythm and rate  Abdomen is soft and non tender with normal bowel sounds  Assessment:  Patient tolerating treatments well  Plan: Continue treatment per original radiation prescription

## 2012-11-09 NOTE — Progress Notes (Signed)
Pt states she has lower mid back pain today which she has had in past. She also states she pain in her feet from neuropathy and swelling. Pt having difficulty going to sleep, took Ativan 0.5 mg last night which was helpful.  Pt states rash under left breast healed but still has rash under right breast. She continues to apply Miconazole powder. Pt applying Radiaplex to right breast tx area. Denies fatigue.

## 2012-11-10 ENCOUNTER — Ambulatory Visit
Admission: RE | Admit: 2012-11-10 | Discharge: 2012-11-10 | Disposition: A | Discharge status: Home / Self Care | Payer: Medicare Other | Source: Ambulatory Visit | Attending: Radiation Oncology | Admitting: Radiation Oncology

## 2012-11-11 ENCOUNTER — Ambulatory Visit
Admission: RE | Admit: 2012-11-11 | Discharge: 2012-11-11 | Disposition: A | Discharge status: Home / Self Care | Payer: Medicare Other | Source: Ambulatory Visit | Attending: Radiation Oncology | Admitting: Radiation Oncology

## 2012-11-12 ENCOUNTER — Ambulatory Visit
Admission: RE | Admit: 2012-11-12 | Discharge: 2012-11-12 | Disposition: A | Discharge status: Home / Self Care | Payer: Medicare Other | Source: Ambulatory Visit | Attending: Radiation Oncology | Admitting: Radiation Oncology

## 2012-11-15 ENCOUNTER — Ambulatory Visit
Admission: RE | Admit: 2012-11-15 | Discharge: 2012-11-15 | Disposition: A | Discharge status: Home / Self Care | Payer: Medicare Other | Source: Ambulatory Visit | Attending: Radiation Oncology | Admitting: Radiation Oncology

## 2012-11-16 ENCOUNTER — Ambulatory Visit
Admission: RE | Admit: 2012-11-16 | Discharge: 2012-11-16 | Disposition: A | Discharge status: Home / Self Care | Payer: Medicare Other | Source: Ambulatory Visit | Attending: Radiation Oncology | Admitting: Radiation Oncology

## 2012-11-16 ENCOUNTER — Encounter: Payer: Self-pay | Admitting: Radiation Oncology

## 2012-11-16 VITALS — BP 120/76 | HR 77 | Resp 16 | Wt 157.3 lb

## 2012-11-16 DIAGNOSIS — C50311 Malignant neoplasm of lower-inner quadrant of right female breast: Secondary | ICD-10-CM

## 2012-11-16 DIAGNOSIS — C50911 Malignant neoplasm of unspecified site of right female breast: Secondary | ICD-10-CM

## 2012-11-16 MED ORDER — BIAFINE EX EMUL
Freq: Every day | CUTANEOUS | Status: DC
  Administered 2012-11-16: 09:00:00 via TOPICAL

## 2012-11-16 NOTE — Progress Notes (Signed)
Weekly Management Note Current Dose: 40  Gy  Projected Dose: 42.5 Gy   Narrative:  The patient presents for routine under treatment assessment.  CBCT/MVCT images/Port film x-rays were reviewed.  The chart was checked. Doing well. More itching in inframammary fold. Bilateral lower extremity edema (worse during day, better at night). Has called med onc x 2 with no response. Using radiaplex. Verified ebeam set up on machine.   Physical Findings: Weight: 157 lb 4.8 oz (71.351 kg). Pink right breast medially and in inframmary fold. Bilateral nonpitting edema.   Impression:  The patient is tolerating radiation.  Plan:  Continue treatment as planned. Will forward concerns to med onc re: swelling. Switch to biafene.

## 2012-11-16 NOTE — Progress Notes (Signed)
Denies fatigue despite difficulty falling asleep. Faint hyperpigmentation of right/treated breast with dermatitis. Patient reports using radiaplex gel. Provided patient with biafine cream to relieve itching from dermatitis. Non pitting edema of both lower extremities noted. Pedal pulses present.

## 2012-11-17 ENCOUNTER — Ambulatory Visit
Admission: RE | Admit: 2012-11-17 | Discharge: 2012-11-17 | Disposition: A | Discharge status: Home / Self Care | Payer: Medicare Other | Source: Ambulatory Visit | Attending: Radiation Oncology | Admitting: Radiation Oncology

## 2012-11-18 ENCOUNTER — Ambulatory Visit
Admission: RE | Admit: 2012-11-18 | Discharge: 2012-11-18 | Disposition: A | Discharge status: Home / Self Care | Payer: Medicare Other | Source: Ambulatory Visit | Attending: Radiation Oncology | Admitting: Radiation Oncology

## 2012-11-19 ENCOUNTER — Ambulatory Visit
Admission: RE | Admit: 2012-11-19 | Discharge: 2012-11-19 | Disposition: A | Discharge status: Home / Self Care | Payer: Medicare Other | Source: Ambulatory Visit | Attending: Radiation Oncology | Admitting: Radiation Oncology

## 2012-11-22 ENCOUNTER — Encounter: Payer: Self-pay | Admitting: Radiation Oncology

## 2012-11-22 ENCOUNTER — Telehealth: Payer: Self-pay | Admitting: Radiation Oncology

## 2012-11-22 ENCOUNTER — Ambulatory Visit
Admission: RE | Admit: 2012-11-22 | Discharge: 2012-11-22 | Disposition: A | Discharge status: Home / Self Care | Payer: Medicare Other | Source: Ambulatory Visit | Attending: Radiation Oncology | Admitting: Radiation Oncology

## 2012-11-22 ENCOUNTER — Other Ambulatory Visit: Payer: Self-pay | Admitting: Oncology

## 2012-11-22 VITALS — BP 123/66 | HR 74 | Resp 16 | Wt 157.6 lb

## 2012-11-22 DIAGNOSIS — C50311 Malignant neoplasm of lower-inner quadrant of right female breast: Secondary | ICD-10-CM

## 2012-11-22 NOTE — Progress Notes (Signed)
[facility]    Radiation Oncology [address] [place]     Maryln Gottron, M.D. [place], [place] [address]               Billie Lade, M.D., Ph.D. Phone: [phone]      Molli Hazard A. Kathrynn Running, M.D. Fax: [phone]      Radene Gunning, M.D., Ph.D.         Lurline Hare, M.D.         Grayland Jack, M.D Weekly Treatment Management Note  Name: Shelia Obrien     MRN: [id number]        CSN: [id number] Date: 11/22/2012      DOB: 11/25/1940  CC: Shelia Kayser, MD         [place]    Status: Outpatient  Diagnosis: The encounter diagnosis was Cancer of lower-inner quadrant of female breast, right.  Current Dose: 50 Gy  Current Fraction: 20  Planned Dose: 50 Gy  Narrative: [name] was seen today for weekly treatment management. The chart was checked and port films  were reviewed. [name]. She does have some itching in the upper inner aspect of the breast. She denies any drainage from the breast area.  Review of patient's allergies indicates no known allergies. Current Outpatient Prescriptions  Medication Sig Dispense Refill  . acetaminophen (TYLENOL) 500 MG tablet Take 500 mg by mouth every 6 (six) hours as needed for pain.      Marland Kitchen emollient (BIAFINE) cream Apply topically as needed.      . hyaluronate sodium (RADIAPLEXRX) GEL Apply topically 2 (two) times daily. Right whole breast due to radiation tx      . HYDROcodone-acetaminophen (NORCO/VICODIN) 5-325 MG per tablet Take 1 tablet by mouth every 6 (six) hours as needed for pain.      Marland Kitchen HYDROcodone-acetaminophen (NORCO/VICODIN) 5-325 MG per tablet Take 1-2 tablets by mouth every 4 (four) hours as needed for pain.  20 tablet  0  . levothyroxine (SYNTHROID, LEVOTHROID) 75 MCG tablet Take 75 mcg by mouth every morning.       . lidocaine-prilocaine (EMLA) cream Apply 1 application topically as needed (for port-a-cath access).       Marland Kitchen loratadine (CLARITIN) 10 MG tablet Take  10 mg by mouth every morning.      Marland Kitchen LORazepam (ATIVAN) 0.5 MG tablet Take 0.5 mg by mouth every 8 (eight) hours.      . miconazole (MICRO GUARD) 2 % powder Apply topically as needed for itching. Under both breasts to rash      . pantoprazole (PROTONIX) 40 MG tablet Take 40 mg by mouth every morning.       . rosuvastatin (CRESTOR) 10 MG tablet Take 10 mg by mouth daily.       . valACYclovir (VALTREX) 1000 MG tablet Take 1,000 mg by mouth 2 (two) times daily as needed (for cold sores).       . Vitamin D, Ergocalciferol, (DRISDOL) 50000 UNITS CAPS Take 50,000 Units by mouth every 7 (seven) days. Taken on Wednesdays.       No current facility-administered medications for this encounter.   Labs:  Lab Results  Component Value Date   WBC 6.3 10/28/2012   HGB 12.9 10/28/2012   HCT 38.5 10/28/2012   MCV 91.7 10/28/2012   PLT 200 10/28/2012   Lab Results  Component Value Date   CREATININE 0.81 10/28/2012   BUN 10 10/28/2012  NA 141 10/28/2012   K 4.9 10/28/2012   CL 107 10/28/2012   CO2 28 10/28/2012   Lab Results  Component Value Date   ALT 34 09/28/2012   AST 33 09/28/2012   PHOS 1.9* 09/02/2012   BILITOT 0.62 09/28/2012    Physical Examination:  weight is 157 lb 9.6 oz (71.487 kg). Her blood pressure is 123/66 and her pulse is 74. Her respiration is 16.    Wt Readings from Last 3 Encounters:  11/22/12 157 lb 9.6 oz (71.487 kg)  11/16/12 157 lb 4.8 oz (71.351 kg)  11/09/12 158 lb 12.8 oz (72.031 kg)    The right breast area shows erythema throughout with radiation dermatitis in the upper-inner aspect. There is no moist desquamation noted. Lungs - Normal respiratory effort, chest expands symmetrically. Lungs are clear to auscultation, no crackles or wheezes.  Heart has regular rhythm and rate  Abdomen is soft and non tender with normal bowel sounds  Assessment:  Patient tolerating treatments well  Plan: Routine followup in one month. I have advised the patient to consider hydrocortisone  cream if the itching becomes more bothersome.

## 2012-11-22 NOTE — Progress Notes (Signed)
Hyperpigmentation of right upper chest wall noted. Itchy radiation dermatitis noted right upper chest wall despite using biafine bid. Denies fatigue. Provided patient with appointment card to return in one month for follow up. Encouraged patient to contact staff with future needs. Provided patient with [facility] and [facility] flyers then, reviewed pertinent information. Patient verbalized understanding.

## 2012-11-22 NOTE — Telephone Encounter (Signed)
Message copied by Agnes [name] on Mon Nov 22, 2012  5:28 PM ------      Message from: Victorino December      Created: Mon Nov 22, 2012  4:57 PM      Regarding: RE: Completed Radiation Therapy      Contact: [phone]       Please let her know that my office will be calling her for an appointment, if she does not hear from them in 48 hours she should call the office and ask for the triage nurse      ----- Message -----         From: Agnes [name], RN         Sent: 11/22/2012   8:21 AM           To: Billie Lade, MD, Victorino December, MD      Subject: Completed Radiation Therapy                              Dr. Welton Flakes.            Mrs. Plourde has completed radiation therapy. She is of the understanding she should see you to discuss antiestrogen therapy. She does not have a follow up appointment scheduled. Could you please check into this and have your schedulers call her at [phone] or [phone]. Thank you.            Sam, RN      Ext [phone]       ------

## 2012-11-22 NOTE — Telephone Encounter (Signed)
Phoned patient as requested by Dr. Welton Flakes. Informed her Dr. Milta Deiters office should be in contact within 48 hours but, if no one contacts her to call the office and request the triage nurse. She verbalized understanding.

## 2012-11-23 ENCOUNTER — Ambulatory Visit: Payer: Medicare Other

## 2012-11-24 ENCOUNTER — Telehealth: Payer: Self-pay | Admitting: *Deleted

## 2012-11-24 ENCOUNTER — Ambulatory Visit: Payer: Medicare Other

## 2012-11-24 ENCOUNTER — Encounter: Payer: Self-pay | Admitting: Radiation Oncology

## 2012-11-24 NOTE — Progress Notes (Signed)
  Radiation Oncology         [phone] ________________________________  Name: Shelia Obrien MRN: [id number]  Date: 11/24/2012  DOB: 1940-04-26  End of Treatment Note  Diagnosis:   Invasive lobular carcinoma of the right breast     Indication for treatment:  Breast conservation therapy       Radiation treatment dates:   July 15 through August 11  Site/dose:   Right breast 42.5 gray in 17 fractions, the site of presentation in the lower inner quadrant was boosted to 50 gray  Beams/energy:   Tangential beams encompassing the right breast for the initial set up, boost with a custom electron cutout field using 12 MeV electrons. The patient was treated with hypofractionated accelerated radiation therapy  Narrative: The patient tolerated radiation treatment relatively well.   She did have some fatigue as well as some itching and discomfort in the breast area towards the end of her treatment. She did not experience any moist desquamation.  Plan: The patient has completed radiation treatment. The patient will return to radiation oncology clinic for routine followup in one month. I advised them to call or return sooner if they have any questions or concerns related to their recovery or treatment.  -----------------------------------  Billie Lade, PhD, MD

## 2012-11-24 NOTE — Progress Notes (Signed)
   Department of Radiation Oncology  Phone:  [phone] Fax:        [phone]  Electron beam simulation note  On July 29 the patient underwent additional planning for radiation therapy directed at the right breast area. Patient's treatment planning CT scan was reviewed and she had set up of a custom electron cutout field directed at the site of presentation in the lower inner aspect of the breast area. The patient will be treated with 12 MeV electrons. She will receive 3 additional treatments at 2.5 gray per fraction. A special port plan is requested for treatment.  -----------------------------------  Billie Lade, PhD, MD

## 2012-11-24 NOTE — Telephone Encounter (Signed)
sw pt gv appt d/t for 12/14/12 @ 8:45am.the patient is aware...td

## 2012-11-25 ENCOUNTER — Ambulatory Visit: Payer: Medicare Other

## 2012-11-26 ENCOUNTER — Ambulatory Visit: Payer: Medicare Other

## 2012-11-29 ENCOUNTER — Ambulatory Visit: Payer: Medicare Other

## 2012-11-30 ENCOUNTER — Ambulatory Visit: Payer: Medicare Other

## 2012-12-01 ENCOUNTER — Ambulatory Visit: Payer: Medicare Other

## 2012-12-02 ENCOUNTER — Ambulatory Visit: Payer: Medicare Other

## 2012-12-03 ENCOUNTER — Ambulatory Visit: Payer: Medicare Other

## 2012-12-03 ENCOUNTER — Ambulatory Visit (INDEPENDENT_AMBULATORY_CARE_PROVIDER_SITE_OTHER): Payer: Medicare Other | Admitting: General Surgery

## 2012-12-03 ENCOUNTER — Encounter (INDEPENDENT_AMBULATORY_CARE_PROVIDER_SITE_OTHER): Payer: Self-pay | Admitting: General Surgery

## 2012-12-03 VITALS — BP 130/70 | HR 73 | Temp 96.8°F | Ht 63.0 in | Wt 155.2 lb

## 2012-12-03 DIAGNOSIS — C50319 Malignant neoplasm of lower-inner quadrant of unspecified female breast: Secondary | ICD-10-CM

## 2012-12-03 DIAGNOSIS — C50311 Malignant neoplasm of lower-inner quadrant of right female breast: Secondary | ICD-10-CM

## 2012-12-03 NOTE — Progress Notes (Signed)
HISTORY: Pt is a 72 yo F who is around 2-3 weeks post port a cath removal.  She had Stage I disease, but higher oncotype.  She did not tolerate chemotherapy well at all.  Second dose was worse than the first.  She has done well with port a cath removal.  She just finished radiation as well.  She is healing up from both.  She is not still taking analgesics.  She denies fevers or chills.      EXAM: General:  Alert and oriented Incision:  Healing well.  No erythema or drainage   PATHOLOGY: n/a   ASSESSMENT AND PLAN:   Cancer of lower-inner quadrant of female breast No evidence of surgical complications.   Follow up in 4-6 months with me.  Pt has follow up with Dr. Welton Flakes (oncology) in around 2 weeks.        Shelia Diego, MD Surgical Oncology, General & Endocrine Surgery [facility]  [name], MD [name], MD

## 2012-12-03 NOTE — Assessment & Plan Note (Signed)
No evidence of surgical complications.   Follow up in 4-6 months with me.  Pt has follow up with Dr. Welton Flakes (oncology) in around 2 weeks.

## 2012-12-03 NOTE — Patient Instructions (Signed)
Follow up with me in 4-6 months.  If you are seeing Dr. Welton Flakes in dec/jan, I do not need to see you for 6 months.

## 2012-12-06 ENCOUNTER — Ambulatory Visit: Payer: Medicare Other

## 2012-12-07 ENCOUNTER — Ambulatory Visit: Payer: Medicare Other

## 2012-12-07 HISTORY — PX: CARDIOVASCULAR STRESS TEST: SHX262

## 2012-12-08 ENCOUNTER — Ambulatory Visit: Payer: Medicare Other

## 2012-12-09 ENCOUNTER — Ambulatory Visit: Payer: Medicare Other

## 2012-12-10 ENCOUNTER — Ambulatory Visit: Payer: Medicare Other

## 2012-12-14 ENCOUNTER — Ambulatory Visit (HOSPITAL_BASED_OUTPATIENT_CLINIC_OR_DEPARTMENT_OTHER): Payer: Medicare Other | Admitting: Oncology

## 2012-12-14 ENCOUNTER — Encounter: Payer: Self-pay | Admitting: Oncology

## 2012-12-14 ENCOUNTER — Other Ambulatory Visit: Payer: Self-pay | Admitting: Oncology

## 2012-12-14 ENCOUNTER — Telehealth: Payer: Self-pay | Admitting: Oncology

## 2012-12-14 ENCOUNTER — Ambulatory Visit: Payer: Medicare Other

## 2012-12-14 VITALS — BP 137/81 | HR 84 | Temp 97.8°F | Resp 20 | Ht 63.0 in | Wt 154.2 lb

## 2012-12-14 DIAGNOSIS — C50311 Malignant neoplasm of lower-inner quadrant of right female breast: Secondary | ICD-10-CM

## 2012-12-14 DIAGNOSIS — Z170 Estrogen receptor positive status [ER+]: Secondary | ICD-10-CM

## 2012-12-14 DIAGNOSIS — C50319 Malignant neoplasm of lower-inner quadrant of unspecified female breast: Secondary | ICD-10-CM

## 2012-12-14 DIAGNOSIS — C50911 Malignant neoplasm of unspecified site of right female breast: Secondary | ICD-10-CM

## 2012-12-14 MED ORDER — TAMOXIFEN CITRATE 20 MG PO TABS: 20.0000 mg | ORAL_TABLET | Freq: Every day | ORAL | Status: AC

## 2012-12-14 NOTE — Patient Instructions (Addendum)
You will now proceed with adjuvant antiestrogen therapy. This will consist of tamoxifen 20 mg daily. Length of treatment will be 10 years.  We discussed the risks and benefits of this. Further information is as below.  I will plan on seeing you back in 3 months time for followup.  Tamoxifen oral tablet What is this medicine? TAMOXIFEN (ta MOX i fen) blocks the effects of estrogen. It is commonly used to treat breast cancer. It is also used to decrease the chance of breast cancer coming back in women who have received treatment for the disease. It may also help prevent breast cancer in women who have a high risk of developing breast cancer. This medicine may be used for other purposes; ask your health care provider or pharmacist if you have questions. What should I tell my health care provider before I take this medicine? They need to know if you have any of these conditions: -blood clots -blood disease -cataracts or impaired eyesight -endometriosis -high calcium levels -high cholesterol -irregular menstrual cycles -liver disease -stroke -uterine fibroids -an unusual or allergic reaction to tamoxifen, other medicines, foods, dyes, or preservatives -pregnant or trying to get pregnant -breast-feeding How should I use this medicine? Take this medicine by mouth with a glass of water. Follow the directions on the prescription label. You can take it with or without food. Take your medicine at regular intervals. Do not take your medicine more often than directed. Do not stop taking except on your doctor's advice. A special MedGuide will be given to you by the pharmacist with each prescription and refill. Be sure to read this information carefully each time. Talk to your pediatrician regarding the use of this medicine in children. While this drug may be prescribed for selected conditions, precautions do apply. Overdosage: If you think you have taken too much of this medicine contact a poison  control center or emergency room at once. NOTE: This medicine is only for you. Do not share this medicine with others. What if I miss a dose? If you miss a dose, take it as soon as you can. If it is almost time for your next dose, take only that dose. Do not take double or extra doses. What may interact with this medicine? -aminoglutethimide -bromocriptine -chemotherapy drugs -female hormones, like estrogens and birth control pills -letrozole -medroxyprogesterone -phenobarbital -rifampin -warfarin This list may not describe all possible interactions. Give your health care provider a list of all the medicines, herbs, non-prescription drugs, or dietary supplements you use. Also tell them if you smoke, drink alcohol, or use illegal drugs. Some items may interact with your medicine. What should I watch for while using this medicine? Visit your doctor or health care professional for regular checks on your progress. You will need regular pelvic exams, breast exams, and mammograms. If you are taking this medicine to reduce your risk of getting breast cancer, you should know that this medicine does not prevent all types of breast cancer. If breast cancer or other problems occur, there is no guarantee that it will be found at an early stage. Do not become pregnant while taking this medicine or for 2 months after stopping this medicine. Stop taking this medicine if you get pregnant or think you are pregnant and contact your doctor. This medicine may harm your unborn baby. Women who can possibly become pregnant should use birth control methods that do not use hormones during tamoxifen treatment and for 2 months after therapy has stopped. Talk with your  health care provider for birth control advice. Do not breast feed while taking this medicine. What side effects may I notice from receiving this medicine? Side effects that you should report to your doctor or health care professional as soon as  possible: -changes in vision (blurred vision) -changes in your menstrual cycle -difficulty breathing or shortness of breath -difficulty walking or talking -new breast lumps -numbness -pelvic pain or pressure -redness, blistering, peeling or loosening of the skin, including inside the mouth -skin rash or itching (hives) -sudden chest pain -swelling of lips, face, or tongue -swelling, pain or tenderness in your calf or leg -unusual bruising or bleeding -vaginal discharge that is bloody, brown, or rust -weakness -yellowing of the whites of the eyes or skin Side effects that usually do not require medical attention (report to your doctor or health care professional if they continue or are bothersome): -fatigue -hair loss, although uncommon and is usually mild -headache -hot flashes -impotence (in men) -nausea, vomiting (mild) -vaginal discharge (white or clear) This list may not describe all possible side effects. Call your doctor for medical advice about side effects. You may report side effects to FDA at 1-800-FDA-1088. Where should I keep my medicine? Keep out of the reach of children. Store at room temperature between 20 and 25 degrees C (68 and 77 degrees F). Protect from light. Keep container tightly closed. Throw away any unused medicine after the expiration date. NOTE: This sheet is a summary. It may not cover all possible information. If you have questions about this medicine, talk to your doctor, pharmacist, or health care provider.  2012, Elsevier/Gold Standard. (12/16/2007 12:01:56 PM)

## 2012-12-14 NOTE — Progress Notes (Signed)
OFFICE PROGRESS NOTE  CC  [name], MD [address] [address] Dr. Almond Lint  Dr. 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   STAGE:  Cancer of lower-inner quadrant of female breast  Primary site: Breast (Right)  Staging method: AJCC 7th Edition  Clinical: Stage IA (T1c, N0, cM0)  Summary: Stage IA (T1c, N0, cM0)   PRIOR THERAPY: #1 patient originally presented at the [facility] with a mammogram that showed architectural distortion. Ultrasound showed a 1.2 cm mass in the 4:00 position of the right breast. MRI confirmed a solitary lesion measuring 1.6 cm up. 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  #2 patient is status post lumpectomy with sentinel lymph node biopsy the final pathology revealed a 1.7 cm invasive lobular carcinoma. Tumor was ER positive PR positive HER-2/neu negative with a Ki-67 of 12% 4 sentinel nodes were negative for metastatic disease.  #3 patient had Oncotype DX testing performed her recurrence score was 29 giving her a 19% risk of distant recurrence with adjuvant hormonal therapy only. Patient and I and her family discussed her Oncotype DX testing this puts her in the intermediate risk category. We discussed side effects of chemotherapy types of chemotherapy. I would recommend Taxotere and Cytoxan every 3 weeks for 4 cycles. We can certainly begin this as soon as the Port-A-Cath is placed.  #4 patient completed 2 cycles of Taxotere Cytoxan from 08/27/2012 through June 2014. She declined any further therapy.  #5 patient is now status post adjuvant radiation therapy from 10/26/2012 through 11/22/2012.  #6 proceed with adjuvant antiestrogen therapy with tamoxifen 20 daily starting 12/14/2012. She will receive total 5 - 10  years of therapy risks benefits of treatment are  discussed.  CURRENT THERAPY: Tamoxifen 20 mg daily  INTERVAL HISTORY: Shelia Obrien JANUARY 72 y.o. female returns for followup visit today. Overall she's doing much better. She tolerated the radiation very nicely. She has very minimal residual side effects from the radiation. She however does note some fatigue and hot flashes. She also noticed some swelling in her ankles but this is not significantly improved especially when she keeps her feet elevated at night. She has no history of vaginal discharge. She has had a hysterectomy. She has no history of strokes or blood clots. Remainder of the 10 point review of systems is negative.   MEDICAL HISTORY: Past Medical History  Diagnosis Date  . Bronchitis   . Thyroid disease   . Acid reflux disease   . High cholesterol   . Seasonal allergies   . Hypothyroidism   . Leaking of urine     Dribbles if coughs. Pt wears pad  . Epistaxis     went to [facility] ER  . Shortness of breath   . PONV (postoperative nausea and vomiting)   . Wears glasses   . Arthritis   . Hypertension june 2014    no current bp meds  . Breast cancer 06/24/12    right, lower inner  . Swelling of left lower extremity     x 2 weeks, no sob  . Swelling of right lower extremity x 2 weeks    no sob    ALLERGIES:  has No Known Allergies.  MEDICATIONS:  Current Outpatient Prescriptions  Medication Sig Dispense Refill  . acetaminophen (TYLENOL) 500 MG tablet Take  500 mg by mouth every 6 (six) hours as needed for pain.      Marland Kitchen b complex vitamins tablet Take 1 tablet by mouth daily.      Marland Kitchen emollient (BIAFINE) cream Apply topically as needed.      Marland Kitchen levothyroxine (SYNTHROID, LEVOTHROID) 75 MCG tablet Take 75 mcg by mouth every morning.       . lidocaine-prilocaine (EMLA) cream Apply 1 application topically as needed (for port-a-cath access).       . pantoprazole (PROTONIX) 40 MG tablet Take 40 mg by mouth every morning.       . rosuvastatin (CRESTOR) 10 MG tablet Take 10 mg by  mouth daily.       . valACYclovir (VALTREX) 1000 MG tablet Take 1,000 mg by mouth 2 (two) times daily as needed (for cold sores).       . Vitamin D, Ergocalciferol, (DRISDOL) 50000 UNITS CAPS Take 50,000 Units by mouth every 7 (seven) days. Taken on Wednesdays.      . hyaluronate sodium (RADIAPLEXRX) GEL Apply topically 2 (two) times daily. Right whole breast due to radiation tx      . HYDROcodone-acetaminophen (NORCO/VICODIN) 5-325 MG per tablet Take 1 tablet by mouth every 6 (six) hours as needed for pain.      Marland Kitchen HYDROcodone-acetaminophen (NORCO/VICODIN) 5-325 MG per tablet Take 1-2 tablets by mouth every 4 (four) hours as needed for pain.  20 tablet  0  . loratadine (CLARITIN) 10 MG tablet Take 10 mg by mouth every morning.      Marland Kitchen LORazepam (ATIVAN) 0.5 MG tablet Take 0.5 mg by mouth every 8 (eight) hours.      . miconazole (MICRO GUARD) 2 % powder Apply topically as needed for itching. Under both breasts to rash       No current facility-administered medications for this visit.    SURGICAL HISTORY:  Past Surgical History  Procedure Laterality Date  . Upper gi endoscopy      last in 05/10/10  . Colonoscopy    . Knee arthroscopy Left 04/11/02  . Nasal septum surgery  04/01/95  . Breast lumpectomy with needle localization and axillary sentinel lymph node bx Right 07/22/2012    Procedure: BREAST LUMPECTOMY WITH NEEDLE LOCALIZATION AND AXILLARY SENTINEL LYMPH NODE BX;  Surgeon: Almond Lint, MD;  Location: [facility] OR;  Service: General;  Laterality: Right;  . Portacath placement Left 08/25/2012    Procedure: INSERTION PORT-A-CATH;  Surgeon: Almond Lint, MD;  Location: [facility];  Service: General;  Laterality: Left;  . Abdominal hysterectomy  04/1975    partial, heavy menses  . Cholecystectomy  06/21/96  . Port-a-cath removal N/A 11/02/2012    Procedure: REMOVAL PORT-A-CATH;  Surgeon: Almond Lint, MD;  Location: [facility];  Service: General;  Laterality: N/A;    REVIEW OF SYSTEMS:   Pertinent items are noted in HPI.   HEALTH MAINTENANCE:   PHYSICAL EXAMINATION: Blood pressure 137/81, pulse 84, temperature 97.8 F (36.6 C), temperature source Oral, resp. rate 20, height 5\' 3"  (1.6 m), weight 154 lb 3.2 oz (69.945 kg). Body mass index is 27.32 kg/(m^2). ECOG PERFORMANCE STATUS: 0 - Asymptomatic   well-developed older his female in no acute distress HEENT exam EOMI PERRLA sclerae anicteric no conjunctival pallor oral mucosa is moist neck is supple lungs are clear to auscultation cardiovascular is regular rate rhythm abdomen is soft nontender nondistended bowel sounds are present no HSM extremities no edema neuro patient's alert oriented otherwise nonfocal right lumpectomy site  looks well-healed no skin changes no erythema no nodularity.   LABORATORY DATA: Lab Results  Component Value Date   WBC 6.3 10/28/2012   HGB 12.9 10/28/2012   HCT 38.5 10/28/2012   MCV 91.7 10/28/2012   PLT 200 10/28/2012      Chemistry      Component Value Date/Time   NA 141 10/28/2012 0925   NA 139 09/28/2012 1347   K 4.9 10/28/2012 0925   K 3.8 09/28/2012 1347   CL 107 10/28/2012 0925   CL 104 09/28/2012 1347   CO2 28 10/28/2012 0925   CO2 22 09/28/2012 1347   BUN 10 10/28/2012 0925   BUN 9.9 09/28/2012 1347   CREATININE 0.81 10/28/2012 0925   CREATININE 1.0 09/28/2012 1347      Component Value Date/Time   CALCIUM 9.7 10/28/2012 0925   CALCIUM 9.8 09/28/2012 1347   ALKPHOS 89 09/28/2012 1347   ALKPHOS 43 09/23/2012 0553   AST 33 09/28/2012 1347   AST 14 09/23/2012 0553   ALT 34 09/28/2012 1347   ALT 14 09/23/2012 0553   BILITOT 0.62 09/28/2012 1347   BILITOT 1.3* 09/23/2012 0553     ADDITIONAL INFORMATION: 1. A sample (block 1A) was sent to [facility] for Oncotype testing. The patient's recurrence score is 29. Those patients who had a recurrence score of 29 had an average rate of distant recurrence of 19%. (JBK:caf 08/06/12) Pecola Leisure MD Pathologist, Electronic Signature ( Signed  08/06/2012) 1. CHROMOGENIC IN-SITU HYBRIDIZATION Results: HER-2/NEU BY CISH - NO AMPLIFICATION OF HER-2 DETECTED. RESULT RATIO OF HER2: CEP 17 SIGNALS 0.94 AVERAGE HER2 COPY NUMBER PER CELL 2.20 REFERENCE RANGE NEGATIVE HER2/Chr17 Ratio <2.0 and Average HER2 copy number <4.0 EQUIVOCAL HER2/Chr17 Ratio <2.0 and Average HER2 copy number 4.0 and <6.0 POSITIVE HER2/Chr17 Ratio >=2.0 and/or Average HER2 copy number >=6.0I Jimmy Picket MD Pathologist, Electronic Signature ( Signed 07/30/2012) FINAL DIAGNOSIS Diagnosis 1. Breast, lumpectomy, right 1 of 4 FINAL for Shelia, Obrien D [id number]) Diagnosis(continued) - INVASIVE LOBULAR CARCINOMA, GRADE II/III, SPANNING 1.7 CM. - LOBULAR CARCINOMA IN SITU. - PERINEURAL INVASION IS IDENTIFIED. - THE SURGICAL RESECTION MARGINS ARE NEGATIVE FOR CARCINOMA. - SEE ONCOLOGY TABLE BELOW. 2. Lymph node, sentinel, biopsy, #1 right axilla - THERE IS NO EVIDENCE OF CARCINOMA IN 1 OF 1 LYMPH NODE (0/1). 3. Lymph node, sentinel, biopsy, #2 right axilla - THERE IS NO EVIDENCE OF CARCINOMA IN 1 OF 1 LYMPH NODE (0/1). 4. Lymph node, sentinel, biopsy, #3, right axilla - THERE IS NO EVIDENCE OF CARCINOMA IN 1 OF 1 LYMPH NODE (0/1). 5. Lymph node, sentinel, biopsy, #4, right axilla - THERE IS NO EVIDENCE OF CARCINOMA IN 1 OF 1 LYMPH NODE (0/1). Microscopic Comment 1. BREAST, INVASIVE TUMOR, WITH LYMPH NODE SAMPLING Specimen, including laterality: Right breast. Procedure: Lumpectomy. Grade: II. Tubule formation: 3. Nuclear pleomorphism: 2. Mitotic: 1. Tumor size (gross measurement): 1.7 cm. Margins: Invasive, distance to closest margin: 0.5 cm to the anterior and superior margins (gross measurements). Lymphovascular invasion: Not identified. Ductal carcinoma in situ: Not identified. Tumor focality: Unifocal. Treatment effect: N/A. Extent of tumor: Confined to breast parenchyma. Lymph nodes: # examined: 4. Lymph nodes with metastasis: 0. Breast  prognostic profile: Case [id number]. Estrogen receptor: 93%, strong staining intensity. Progesterone receptor: 7%, strong staining intensity. HER-2/neu: No amplification was detected. The ratio was 1.04. HER-2/neu by CISH will be repeated on the current case and the results reported separately. Ki-67: 12%. Non-neoplastic breast: Healing biopsy site. TNM: pT1c, pN0. Comments: Immunohistochemical stains  performed on parts 2-5 fail to highlight the presence of cytokeratin positive tumor cells. (JBK:eps 07/27/12) JOSHUA  RADIOGRAPHIC STUDIES:  No results found.  ASSESSMENT: 71 year old female with  #1 new diagnosis of invasive lobular carcinoma measuring 1.7 cm node negative ER positive PR positive HER-2/neu negative with Ki-67 low. Patient had Oncotype DX testing performed which showed an intermediate risk category recurrence score. We discussed chemotherapy adjuvantly for 4 cycles. We discussed Taxotere and Cytoxan every 21 days for a total of 4 cycles. Side effects of treatment were discussed very clearly.  #2 she received a total of 2 cycles of Taxotere Cytoxan under 4. She subsequently declined further chemotherapy due to significant side effects.  #3 patient has now completed adjuvant radiation therapy from 10/26/2012 through 11/22/2012.  #4 patient will begin antiestrogen therapy with tamoxifen 20 mg daily risks and benefits of this were discussed with the patient. Literature was given to them. Total of 5 - 10 years of therapy is planned.  PLAN:  #1 patient will proceed with tamoxifen 20 mg daily. Prescription to [facility] was sent for a total of 90 with 12 refills.  #2 she will be seen back in 3 months time for followup. Of course she knows to call me earlier if she thinks there are some side effects she is experiencing.  All questions were answered. The patient knows to call the clinic with any problems, questions or concerns. We can certainly see the patient much sooner if  necessary.  I spent 25 minutes counseling the patient face to face. The total time spent in the appointment was 30 minutes.  Drue Second, MD Medical/Oncology [facility] [phone] (beeper) [phone] (Office)

## 2012-12-22 ENCOUNTER — Encounter: Payer: Self-pay | Admitting: Oncology

## 2012-12-23 ENCOUNTER — Ambulatory Visit
Admission: RE | Admit: 2012-12-23 | Discharge: 2012-12-23 | Disposition: A | Discharge status: Home / Self Care | Payer: Medicare Other | Source: Ambulatory Visit | Attending: Radiation Oncology | Admitting: Radiation Oncology

## 2012-12-23 ENCOUNTER — Encounter: Payer: Self-pay | Admitting: Radiation Oncology

## 2012-12-23 VITALS — BP 148/61 | HR 75 | Temp 97.6°F | Ht 63.0 in | Wt 152.7 lb

## 2012-12-23 DIAGNOSIS — C50311 Malignant neoplasm of lower-inner quadrant of right female breast: Secondary | ICD-10-CM

## 2012-12-23 NOTE — Progress Notes (Signed)
Shelia Obrien here for follow up after treatment to her right breast.  She does have pain in her lower back that she is rating at a 5/10.  She says she has had the pain for quite a while.  She has lost 2 lbs and said she has stopped eating sweats and is drinking juices.  She denies fatigue.  She had a stress test in August that was normal.  She has started taking tamoxifen on Tuesday of this week.  The skin on her right breast is intact with hyperpigmentation.  She does have some rough skin on the her mid chest.  Advised her to use Aquaphor cream.

## 2012-12-23 NOTE — Progress Notes (Signed)
Radiation Oncology         ([phone]) [phone] ________________________________  Name: SHUNTELL FOODY MRN: [id number]  Date: 12/23/2012  DOB: May 11, 1940  Follow-Up Visit Note  CC: [name], MD  Victorino December, MD  Diagnosis:   Invasive lobular carcinoma of the right breast  Interval Since Last Radiation:  1  months  Narrative:  The patient returns today for routine follow-up.  She is doing well at this time. She denies any fatigue. She denies any itching or discomfort in the breast area. She has chronic low back pain. She denies any nipple discharge or bleeding. The patient has started taking tamoxifen. She does have chronic problems with insomnia.                              ALLERGIES:  has No Known Allergies.  Meds: Current Outpatient Prescriptions  Medication Sig Dispense Refill  . acetaminophen (TYLENOL) 500 MG tablet Take 500 mg by mouth every 6 (six) hours as needed for pain.      Marland Kitchen b complex vitamins tablet Take 1 tablet by mouth daily.      Marland Kitchen levothyroxine (SYNTHROID, LEVOTHROID) 75 MCG tablet Take 75 mcg by mouth every morning.       Marland Kitchen LORazepam (ATIVAN) 0.5 MG tablet Take 0.5 mg by mouth every 8 (eight) hours. Takes 1/2 tablet at night to help her sleep.      . pantoprazole (PROTONIX) 40 MG tablet Take 40 mg by mouth every morning.       . rosuvastatin (CRESTOR) 10 MG tablet Take 10 mg by mouth daily.       . tamoxifen (NOLVADEX) 20 MG tablet Take 1 tablet (20 mg total) by mouth daily.  90 tablet  12  . Vitamin D, Ergocalciferol, (DRISDOL) 50000 UNITS CAPS Take 50,000 Units by mouth every 7 (seven) days. Taken on Wednesdays.      Marland Kitchen emollient (BIAFINE) cream Apply topically as needed.      . hyaluronate sodium (RADIAPLEXRX) GEL Apply topically 2 (two) times daily. Right whole breast due to radiation tx      . HYDROcodone-acetaminophen (NORCO/VICODIN) 5-325 MG per tablet Take 1 tablet by mouth every 6 (six) hours as needed for pain.      Marland Kitchen HYDROcodone-acetaminophen  (NORCO/VICODIN) 5-325 MG per tablet Take 1-2 tablets by mouth every 4 (four) hours as needed for pain.  20 tablet  0  . loratadine (CLARITIN) 10 MG tablet Take 10 mg by mouth every morning.      . miconazole (MICRO GUARD) 2 % powder Apply topically as needed for itching. Under both breasts to rash      . valACYclovir (VALTREX) 1000 MG tablet Take 1,000 mg by mouth 2 (two) times daily as needed (for cold sores).        No current facility-administered medications for this encounter.    Physical Findings: The patient is in no acute distress. Patient is alert and oriented.  height is 5\' 3"  (1.6 m) and weight is 152 lb 11.2 oz (69.264 kg). Her temperature is 97.6 F (36.4 C). Her blood pressure is 148/61 and her pulse is 75. Marland Kitchen  No palpable supraclavicular or axillary adenopathy. The lungs are clear to auscultation. The heart has regular rhythm and rate. Examination of the left breast reveals no mass or nipple discharge. Examination right breast reveals some hyperpigmentation changes. Patient continues have some edema in the nipple areolar complex area. There  is no dominant mass appreciated in the breast. The patient's skin is well healed at this time.  .  Impression:  The patient is recovering from the effects of radiation.  No evidence of recurrence on clinical exam today  Plan:  Routine followup in 6 months.  _____________________________________  -----------------------------------  Billie Lade, PhD, MD

## 2013-02-17 ENCOUNTER — Other Ambulatory Visit: Payer: Self-pay

## 2013-03-04 ENCOUNTER — Ambulatory Visit (INDEPENDENT_AMBULATORY_CARE_PROVIDER_SITE_OTHER): Payer: Medicare Other | Admitting: General Surgery

## 2013-03-04 VITALS — BP 126/70 | HR 70 | Temp 98.0°F | Resp 18 | Ht 62.0 in | Wt 158.0 lb

## 2013-03-04 DIAGNOSIS — C50311 Malignant neoplasm of lower-inner quadrant of right female breast: Secondary | ICD-10-CM

## 2013-03-04 DIAGNOSIS — C50319 Malignant neoplasm of lower-inner quadrant of unspecified female breast: Secondary | ICD-10-CM

## 2013-03-04 NOTE — Progress Notes (Signed)
HISTORY: Patient is a 72 year old female who is status post breast conservation therapy in April of this year. She was unable to tolerate chemotherapy which was offered because of her high Oncotype.  She subsequently had her Port-A-Cath removed. She states that she does have a defect in this. She denies nausea vomiting. She will be due for mammogram in March.   PERTINENT REVIEW OF SYSTEMS: Otherwise negative x 11  Filed Vitals:   03/04/13 1107  BP: 126/70  Pulse: 70  Temp: 98 F (36.7 C)  Resp: 18   Filed Weights   03/04/13 1107  Weight: 158 lb (71.668 kg)     EXAM: Head: Normocephalic and atraumatic.  Eyes:  Conjunctivae are normal. Pupils are equal, round, and reactive to light. No scleral icterus.  Neck:  Normal range of motion. Neck supple. No tracheal deviation present. No thyromegaly present.  Resp: No respiratory distress, normal effort. Abd:  Abdomen is soft, non distended and non tender. No masses are palpable.  There is no rebound and no guarding.  Breast:  Some right breast lymphedema and faint erythema.  Size similar.  No palpable masses.  No lymphadenopathy.   Neurological: Alert and oriented to person, place, and time. Coordination normal.  Skin: Skin is warm and dry. No rash noted. No diaphoretic. No erythema. No pallor.  Psychiatric: Normal mood and affect. Normal behavior. Judgment and thought content normal.      ASSESSMENT AND PLAN:   Breast cancer of lower-inner quadrant of right female breast, BCT 07/2012, high oncotype, poorly tolerated chemo No clinical evidence of disease.  I will see the patient back in the spring following her mammogram.  Hopefully at that point we'll alternate with Dr. Darnelle Catalan.      Maudry Diego, MD Surgical Oncology, General & Endocrine Surgery [facility]  [name], MD [name], MD

## 2013-03-04 NOTE — Patient Instructions (Signed)
Follow up in march.    Mammogram due in march.  If it has not been scheduled by our visit, we will arrange for it to be scheduled at that time.    Call earlier for concerns.

## 2013-03-04 NOTE — Assessment & Plan Note (Signed)
No clinical evidence of disease.  I will see the patient back in the spring following her mammogram.  Hopefully at that point we'll alternate with Dr. Darnelle Catalan.

## 2013-03-23 ENCOUNTER — Telehealth: Payer: Self-pay | Admitting: Oncology

## 2013-03-23 ENCOUNTER — Ambulatory Visit (HOSPITAL_BASED_OUTPATIENT_CLINIC_OR_DEPARTMENT_OTHER): Payer: Medicare Other | Admitting: Oncology

## 2013-03-23 ENCOUNTER — Other Ambulatory Visit (HOSPITAL_BASED_OUTPATIENT_CLINIC_OR_DEPARTMENT_OTHER): Payer: Medicare Other

## 2013-03-23 ENCOUNTER — Encounter: Payer: Self-pay | Admitting: Oncology

## 2013-03-23 ENCOUNTER — Ambulatory Visit: Payer: Medicare Other

## 2013-03-23 VITALS — BP 139/75 | HR 85 | Temp 97.9°F | Resp 18 | Ht 62.0 in | Wt 155.7 lb

## 2013-03-23 DIAGNOSIS — C50311 Malignant neoplasm of lower-inner quadrant of right female breast: Secondary | ICD-10-CM

## 2013-03-23 DIAGNOSIS — C50319 Malignant neoplasm of lower-inner quadrant of unspecified female breast: Secondary | ICD-10-CM

## 2013-03-23 DIAGNOSIS — Z170 Estrogen receptor positive status [ER+]: Secondary | ICD-10-CM

## 2013-03-23 DIAGNOSIS — C50911 Malignant neoplasm of unspecified site of right female breast: Secondary | ICD-10-CM

## 2013-03-23 LAB — CBC WITH DIFFERENTIAL/PLATELET
Basophils Absolute: 0.1 10*3/uL (ref 0.0–0.1)
HCT: 40.2 % (ref 34.8–46.6)
HGB: 13.7 g/dL (ref 11.6–15.9)
MCH: 31 pg (ref 25.1–34.0)
MONO#: 0.7 10*3/uL (ref 0.1–0.9)
NEUT%: 46 % (ref 38.4–76.8)
WBC: 5.8 10*3/uL (ref 3.9–10.3)
lymph#: 2.2 10*3/uL (ref 0.9–3.3)

## 2013-03-23 LAB — COMPREHENSIVE METABOLIC PANEL (CC13)
Anion Gap: 13 mEq/L — ABNORMAL HIGH (ref 3–11)
BUN: 14.8 mg/dL (ref 7.0–26.0)
CO2: 22 mEq/L (ref 22–29)
Calcium: 9.7 mg/dL (ref 8.4–10.4)
Chloride: 107 mEq/L (ref 98–109)
Creatinine: 1 mg/dL (ref 0.6–1.1)

## 2013-03-23 LAB — URINALYSIS, MICROSCOPIC - CHCC
Bilirubin (Urine): NEGATIVE
Ketones: NEGATIVE mg/dL
Leukocyte Esterase: NEGATIVE
RBC / HPF: NEGATIVE (ref 0–2)

## 2013-03-23 NOTE — Telephone Encounter (Signed)
, °

## 2013-03-24 ENCOUNTER — Encounter: Payer: Self-pay | Admitting: Oncology

## 2013-03-24 ENCOUNTER — Telehealth: Payer: Self-pay | Admitting: Emergency Medicine

## 2013-03-24 NOTE — Telephone Encounter (Signed)
Spoke with patient; informed her of her UA results. No new instructions given. Instructed patient that this office will notify her if the urine culture is positive. Patient denies fevers; states she gets hot flashes and chills. Denies any urinary sx. Encouraged patient to keep all future appointments and to call for any questions or concerns. Patient verbalized understanding.

## 2013-03-30 NOTE — Progress Notes (Signed)
OFFICE PROGRESS NOTE  CC  [name], MD [address] [address] Dr. Almond Lint  Dr. 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   STAGE:  Cancer of lower-inner quadrant of female breast  Primary site: Breast (Right)  Staging method: AJCC 7th Edition  Clinical: Stage IA (T1c, N0, cM0)  Summary: Stage IA (T1c, N0, cM0)   PRIOR THERAPY: #1 patient originally presented at the [facility] with a mammogram that showed architectural distortion. Ultrasound showed a 1.2 cm mass in the 4:00 position of the right breast. MRI confirmed a solitary lesion measuring 1.6 cm up. 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  #2 patient is status post lumpectomy with sentinel lymph node biopsy the final pathology revealed a 1.7 cm invasive lobular carcinoma. Tumor was ER positive PR positive HER-2/neu negative with a Ki-67 of 12% 4 sentinel nodes were negative for metastatic disease.  #3 patient had Oncotype DX testing performed her recurrence score was 29 giving her a 19% risk of distant recurrence with adjuvant hormonal therapy only. Patient and I and her family discussed her Oncotype DX testing this puts her in the intermediate risk category. We discussed side effects of chemotherapy types of chemotherapy. I would recommend Taxotere and Cytoxan every 3 weeks for 4 cycles. We can certainly begin this as soon as the Port-A-Cath is placed.  #4 patient completed 2 cycles of Taxotere Cytoxan from 08/27/2012 through June 2014. She declined any further therapy.  #5 patient is now status post adjuvant radiation therapy from 10/26/2012 through 11/22/2012.  #6 proceed with adjuvant antiestrogen therapy with tamoxifen 20 daily starting 12/14/2012. She will receive total 5 - 10  years of therapy risks benefits of treatment are  discussed.  CURRENT THERAPY: Tamoxifen 20 mg daily  INTERVAL HISTORY: TITILAYO HAGANS 72 y.o. female returns for followup visit today. Overall she's doing much better.She however does note some fatigue and hot flashes. She also noticed some swelling in her ankles but this is not significantly improved especially when she keeps her feet elevated at night. She has no history of vaginal discharge. She has had a hysterectomy. She has no history of strokes or blood clots. Remainder of the 10 point review of systems is negative.   MEDICAL HISTORY: Past Medical History  Diagnosis Date  . Bronchitis   . Thyroid disease   . Acid reflux disease   . High cholesterol   . Seasonal allergies   . Hypothyroidism   . Leaking of urine     Dribbles if coughs. Pt wears pad  . Epistaxis     went to [facility] ER  . Shortness of breath   . PONV (postoperative nausea and vomiting)   . Wears glasses   . Arthritis   . Hypertension june 2014    no current bp meds  . Breast cancer 06/24/12    right, lower inner  . Swelling of left lower extremity     x 2 weeks, no sob  . Swelling of right lower extremity x 2 weeks    no sob  . History of radiation therapy 10/26/2012-11/22/2012    49 gray to the right breast  . Use of tamoxifen (Nolvadex)     ALLERGIES:  has No Known Allergies.  MEDICATIONS:  Current Outpatient Prescriptions  Medication Sig Dispense Refill  .  b complex vitamins tablet Take 1 tablet by mouth daily.      Marland Kitchen levothyroxine (SYNTHROID, LEVOTHROID) 75 MCG tablet Take 75 mcg by mouth every morning.       Marland Kitchen LORazepam (ATIVAN) 0.5 MG tablet Take 0.5 mg by mouth every 8 (eight) hours. Takes 1/2 tablet at night to help her sleep.      . pantoprazole (PROTONIX) 40 MG tablet Take 40 mg by mouth every morning.       . rosuvastatin (CRESTOR) 10 MG tablet Take 10 mg by mouth daily.       . tamoxifen (NOLVADEX) 20 MG tablet       . valACYclovir (VALTREX) 1000 MG tablet Take 1,000 mg by mouth 2 (two)  times daily as needed (for cold sores).       . Vitamin D, Ergocalciferol, (DRISDOL) 50000 UNITS CAPS Take 50,000 Units by mouth every 7 (seven) days. Taken on Wednesdays.      Marland Kitchen amoxicillin (AMOXIL) 500 MG capsule        No current facility-administered medications for this visit.    SURGICAL HISTORY:  Past Surgical History  Procedure Laterality Date  . Upper gi endoscopy      last in 05/10/10  . Colonoscopy    . Knee arthroscopy Left 04/11/02  . Nasal septum surgery  04/01/95  . Breast lumpectomy with needle localization and axillary sentinel lymph node bx Right 07/22/2012    Procedure: BREAST LUMPECTOMY WITH NEEDLE LOCALIZATION AND AXILLARY SENTINEL LYMPH NODE BX;  Surgeon: Almond Lint, MD;  Location: [facility] OR;  Service: General;  Laterality: Right;  . Portacath placement Left 08/25/2012    Procedure: INSERTION PORT-A-CATH;  Surgeon: Almond Lint, MD;  Location: [facility];  Service: General;  Laterality: Left;  . Abdominal hysterectomy  04/1975    partial, heavy menses  . Cholecystectomy  06/21/96  . Port-a-cath removal N/A 11/02/2012    Procedure: REMOVAL PORT-A-CATH;  Surgeon: Almond Lint, MD;  Location: [facility] ORS;  Service: General;  Laterality: N/A;  . Cardiovascular stress test  12/07/2012    [facility] cardiology    REVIEW OF SYSTEMS:  Pertinent items are noted in HPI.   HEALTH MAINTENANCE:   PHYSICAL EXAMINATION: Blood pressure 139/75, pulse 85, temperature 97.9 F (36.6 C), temperature source Oral, resp. rate 18, height 5\' 2"  (1.575 m), weight 155 lb 11.2 oz (70.625 kg). Body mass index is 28.47 kg/(m^2). ECOG PERFORMANCE STATUS: 0 - Asymptomatic   well-developed older his female in no acute distress HEENT exam EOMI PERRLA sclerae anicteric no conjunctival pallor oral mucosa is moist neck is supple lungs are clear to auscultation cardiovascular is regular rate rhythm abdomen is soft nontender nondistended bowel sounds are present no HSM extremities no edema neuro  patient's alert oriented otherwise nonfocal right lumpectomy site looks well-healed no skin changes no erythema no nodularity.   LABORATORY DATA: Lab Results  Component Value Date   WBC 5.8 03/23/2013   HGB 13.7 03/23/2013   HCT 40.2 03/23/2013   MCV 90.6 03/23/2013   PLT 191 03/23/2013      Chemistry      Component Value Date/Time   NA 143 03/23/2013 0849   NA 141 10/28/2012 0925   K 4.5 03/23/2013 0849   K 4.9 10/28/2012 0925   CL 107 10/28/2012 0925   CL 104 09/28/2012 1347   CO2 22 03/23/2013 0849   CO2 28 10/28/2012 0925   BUN 14.8 03/23/2013 0849   BUN 10 10/28/2012 0925  CREATININE 1.0 03/23/2013 0849   CREATININE 0.81 10/28/2012 0925      Component Value Date/Time   CALCIUM 9.7 03/23/2013 0849   CALCIUM 9.7 10/28/2012 0925   ALKPHOS 47 03/23/2013 0849   ALKPHOS 43 09/23/2012 0553   AST 31 03/23/2013 0849   AST 14 09/23/2012 0553   ALT 25 03/23/2013 0849   ALT 14 09/23/2012 0553   BILITOT 0.73 03/23/2013 0849   BILITOT 1.3* 09/23/2012 0553     ADDITIONAL INFORMATION: 1. A sample (block 1A) was sent to [facility] for Oncotype testing. The patient's recurrence score is 29. Those patients who had a recurrence score of 29 had an average rate of distant recurrence of 19%. (JBK:caf 08/06/12) Pecola Leisure MD Pathologist, Electronic Signature ( Signed 08/06/2012) 1. CHROMOGENIC IN-SITU HYBRIDIZATION Results: HER-2/NEU BY CISH - NO AMPLIFICATION OF HER-2 DETECTED. RESULT RATIO OF HER2: CEP 17 SIGNALS 0.94 AVERAGE HER2 COPY NUMBER PER CELL 2.20 REFERENCE RANGE NEGATIVE HER2/Chr17 Ratio <2.0 and Average HER2 copy number <4.0 EQUIVOCAL HER2/Chr17 Ratio <2.0 and Average HER2 copy number 4.0 and <6.0 POSITIVE HER2/Chr17 Ratio >=2.0 and/or Average HER2 copy number >=6.0I Jimmy Picket MD Pathologist, Electronic Signature ( Signed 07/30/2012) FINAL DIAGNOSIS Diagnosis 1. Breast, lumpectomy, right 1 of 4 FINAL for ELLICIA, ALIX D [phone]) Diagnosis(continued) -  INVASIVE LOBULAR CARCINOMA, GRADE II/III, SPANNING 1.7 CM. - LOBULAR CARCINOMA IN SITU. - PERINEURAL INVASION IS IDENTIFIED. - THE SURGICAL RESECTION MARGINS ARE NEGATIVE FOR CARCINOMA. - SEE ONCOLOGY TABLE BELOW. 2. Lymph node, sentinel, biopsy, #1 right axilla - THERE IS NO EVIDENCE OF CARCINOMA IN 1 OF 1 LYMPH NODE (0/1). 3. Lymph node, sentinel, biopsy, #2 right axilla - THERE IS NO EVIDENCE OF CARCINOMA IN 1 OF 1 LYMPH NODE (0/1). 4. Lymph node, sentinel, biopsy, #3, right axilla - THERE IS NO EVIDENCE OF CARCINOMA IN 1 OF 1 LYMPH NODE (0/1). 5. Lymph node, sentinel, biopsy, #4, right axilla - THERE IS NO EVIDENCE OF CARCINOMA IN 1 OF 1 LYMPH NODE (0/1). Microscopic Comment 1. BREAST, INVASIVE TUMOR, WITH LYMPH NODE SAMPLING Specimen, including laterality: Right breast. Procedure: Lumpectomy. Grade: II. Tubule formation: 3. Nuclear pleomorphism: 2. Mitotic: 1. Tumor size (gross measurement): 1.7 cm. Margins: Invasive, distance to closest margin: 0.5 cm to the anterior and superior margins (gross measurements). Lymphovascular invasion: Not identified. Ductal carcinoma in situ: Not identified. Tumor focality: Unifocal. Treatment effect: N/A. Extent of tumor: Confined to breast parenchyma. Lymph nodes: # examined: 4. Lymph nodes with metastasis: 0. Breast prognostic profile: Case [id number]. Estrogen receptor: 93%, strong staining intensity. Progesterone receptor: 7%, strong staining intensity. HER-2/neu: No amplification was detected. The ratio was 1.04. HER-2/neu by CISH will be repeated on the current case and the results reported separately. Ki-67: 12%. Non-neoplastic breast: Healing biopsy site. TNM: pT1c, pN0. Comments: Immunohistochemical stains performed on parts 2-5 fail to highlight the presence of cytokeratin positive tumor cells. (JBK:eps 07/27/12) JOSHUA  RADIOGRAPHIC STUDIES:  No results found.  ASSESSMENT: 72 year old female with  #1 new diagnosis of  invasive lobular carcinoma measuring 1.7 cm node negative ER positive PR positive HER-2/neu negative with Ki-67 low. Patient had Oncotype DX testing performed which showed an intermediate risk category recurrence score. We discussed chemotherapy adjuvantly for 4 cycles. We discussed Taxotere and Cytoxan every 21 days for a total of 4 cycles. Side effects of treatment were discussed very clearly.  #2 she received a total of 2 cycles of Taxotere Cytoxan under 4. She subsequently declined further chemotherapy due to significant side effects.  #3  patient has now completed adjuvant radiation therapy from 10/26/2012 through 11/22/2012.  #4 patient is on tamoxifen 20 mg daily since past 3 months. Overall she's tolerating it well without any significant problems except for hot flashes.  PLAN:  #1 continue tamoxifen 20 mg. Total of 10 years of therapy is planned.  #2 patient will return in 6 months time in followup.  All questions were answered. The patient knows to call the clinic with any problems, questions or concerns. We can certainly see the patient much sooner if necessary.  I spent 25 minutes counseling the patient face to face. The total time spent in the appointment was 30 minutes.  Drue Second, MD Medical/Oncology [facility] [phone] (beeper) [phone] (Office)

## 2013-06-16 ENCOUNTER — Telehealth: Payer: Self-pay | Admitting: Oncology

## 2013-06-16 NOTE — Telephone Encounter (Signed)
, °

## 2013-06-20 ENCOUNTER — Ambulatory Visit: Payer: Self-pay | Admitting: Radiation Oncology

## 2013-06-23 ENCOUNTER — Ambulatory Visit
Admission: RE | Admit: 2013-06-23 | Discharge: 2013-06-23 | Disposition: A | Discharge status: Home / Self Care | Payer: Medicare Other | Source: Ambulatory Visit | Attending: Radiation Oncology | Admitting: Radiation Oncology

## 2013-06-23 ENCOUNTER — Encounter: Payer: Self-pay | Admitting: Radiation Oncology

## 2013-06-23 VITALS — BP 151/77 | HR 67 | Temp 97.8°F | Ht 62.0 in | Wt 158.6 lb

## 2013-06-23 DIAGNOSIS — C50311 Malignant neoplasm of lower-inner quadrant of right female breast: Secondary | ICD-10-CM

## 2013-06-23 NOTE — Progress Notes (Addendum)
Shelia Obrien here for follow up after treatment to her right breast.  She denies pain except for chronic pain in her lower back.  She denies fatigue.  She does report trouble sleeping due to hot flashes and chills.  She is taking tamoxifen.  The skin on her right breast is intact.  She had a mammogram yesterday and was told it was good.

## 2013-06-23 NOTE — Progress Notes (Signed)
  Radiation Oncology         [phone]) [phone] ________________________________  Name: Shelia Obrien MRN: [id number]  Date: 06/23/2013  DOB: 1940/11/04  Follow-Up Visit Note  CC: Jerlyn Ly, MD  Deatra Robinson, MD  Diagnosis:   Invasive lobular carcinoma of the right breast  Interval Since Last Radiation:  7  months  Narrative:  The patient returns today for routine follow-up.  She is doing well this time except for hot flashes. She is on tamoxifen. I recommend she discuss additional medication to help alleviate her symptoms with Dr. Humphrey Rolls. She does have difficulty sleeping at night in light of this issue. Patient did have a mammogram yesterday at [facility]. According to patient there were no problems in either breast. official report is not available at this time. Patient denies any pain in the right breast area nipple discharge or bleeding. She denies any problems with swelling in her right arm or hand.                              ALLERGIES:  has No Known Allergies.  Meds: Current Outpatient Prescriptions  Medication Sig Dispense Refill  . b complex vitamins tablet Take 1 tablet by mouth daily.      Marland Kitchen levothyroxine (SYNTHROID, LEVOTHROID) 75 MCG tablet Take 75 mcg by mouth every morning.       . loratadine (CLARITIN) 10 MG tablet Take 10 mg by mouth daily.      Marland Kitchen LORazepam (ATIVAN) 0.5 MG tablet Take 0.5 mg by mouth every 8 (eight) hours. Takes 1/2 tablet at night to help her sleep.      . pantoprazole (PROTONIX) 40 MG tablet Take 40 mg by mouth every morning.       . rosuvastatin (CRESTOR) 10 MG tablet Take 10 mg by mouth daily.       . tamoxifen (NOLVADEX) 20 MG tablet       . valACYclovir (VALTREX) 1000 MG tablet Take 1,000 mg by mouth 2 (two) times daily as needed (for cold sores).       . Vitamin D, Ergocalciferol, (DRISDOL) 50000 UNITS CAPS Take 5,000 Units by mouth every 7 (seven) days. Taken on Wednesdays.       No current facility-administered medications for this  encounter.    Physical Findings: The patient is in no acute distress. Patient is alert and oriented.  height is 5\' 2"  (1.575 m) and weight is 158 lb 9.6 oz (71.94 kg). Her temperature is 97.8 F (36.6 C). Her blood pressure is 151/77 and her pulse is 67. Marland Kitchen  No palpable supraclavicular or axillary adenopathy. The lungs are clear to auscultation. The heart has a regular rhythm and rate. Examination of the left breast reveals no mass or nipple discharge. Examination right breast reveals mild hyperpigmentation changes and mild edema. There is no dominant mass appreciated in the breast nipple discharge or bleeding.  Lab Findings: Lab Results  Component Value Date   WBC 5.8 03/23/2013   HGB 13.7 03/23/2013   HCT 40.2 03/23/2013   MCV 90.6 03/23/2013   PLT 191 03/23/2013      Radiographic Findings: No results found.  Impression:  No evidence of recurrence on clinical exam today.  Plan:  When necessary followup in radiation oncology. The patient will continue close followup in medical oncology.  ____________________________________ Blair Promise, MD

## 2013-09-14 ENCOUNTER — Telehealth: Payer: Self-pay | Admitting: Hematology and Oncology

## 2013-09-14 NOTE — Telephone Encounter (Signed)
, °

## 2013-09-22 ENCOUNTER — Other Ambulatory Visit: Payer: Medicare Other

## 2013-09-22 ENCOUNTER — Ambulatory Visit: Payer: Medicare Other | Admitting: Oncology

## 2013-09-30 ENCOUNTER — Telehealth: Payer: Self-pay

## 2013-09-30 NOTE — Telephone Encounter (Signed)
Received by fax progress notes from [facility] dtd 09/29/13 Dr. Joylene Draft.  Copy to [place].  Original to scan.

## 2013-12-06 ENCOUNTER — Other Ambulatory Visit: Payer: Self-pay | Admitting: *Deleted

## 2013-12-06 DIAGNOSIS — C50311 Malignant neoplasm of lower-inner quadrant of right female breast: Secondary | ICD-10-CM

## 2013-12-07 ENCOUNTER — Ambulatory Visit (HOSPITAL_BASED_OUTPATIENT_CLINIC_OR_DEPARTMENT_OTHER): Payer: Medicare Other | Admitting: Hematology and Oncology

## 2013-12-07 ENCOUNTER — Telehealth: Payer: Self-pay | Admitting: Hematology and Oncology

## 2013-12-07 ENCOUNTER — Encounter: Payer: Self-pay | Admitting: Hematology and Oncology

## 2013-12-07 ENCOUNTER — Other Ambulatory Visit (HOSPITAL_BASED_OUTPATIENT_CLINIC_OR_DEPARTMENT_OTHER): Payer: Medicare Other

## 2013-12-07 VITALS — BP 142/67 | HR 85 | Temp 98.4°F | Resp 18 | Ht 62.0 in | Wt 163.6 lb

## 2013-12-07 DIAGNOSIS — C50319 Malignant neoplasm of lower-inner quadrant of unspecified female breast: Secondary | ICD-10-CM

## 2013-12-07 DIAGNOSIS — C50311 Malignant neoplasm of lower-inner quadrant of right female breast: Secondary | ICD-10-CM

## 2013-12-07 DIAGNOSIS — Z170 Estrogen receptor positive status [ER+]: Secondary | ICD-10-CM

## 2013-12-07 LAB — CBC WITH DIFFERENTIAL/PLATELET
BASO%: 1 % (ref 0.0–2.0)
Basophils Absolute: 0.1 10*3/uL (ref 0.0–0.1)
EOS ABS: 0.2 10*3/uL (ref 0.0–0.5)
EOS%: 2.6 % (ref 0.0–7.0)
HCT: 40.7 % (ref 34.8–46.6)
HGB: 13.6 g/dL (ref 11.6–15.9)
LYMPH%: 42 % (ref 14.0–49.7)
MCH: 31.4 pg (ref 25.1–34.0)
MCHC: 33.3 g/dL (ref 31.5–36.0)
MCV: 94.2 fL (ref 79.5–101.0)
MONO#: 0.9 10*3/uL (ref 0.1–0.9)
MONO%: 11.4 % (ref 0.0–14.0)
NEUT%: 43 % (ref 38.4–76.8)
NEUTROS ABS: 3.4 10*3/uL (ref 1.5–6.5)
Platelets: 209 10*3/uL (ref 145–400)
RBC: 4.32 10*6/uL (ref 3.70–5.45)
RDW: 13.8 % (ref 11.2–14.5)
WBC: 7.9 10*3/uL (ref 3.9–10.3)
lymph#: 3.3 10*3/uL (ref 0.9–3.3)

## 2013-12-07 LAB — COMPREHENSIVE METABOLIC PANEL (CC13)
ALK PHOS: 41 U/L (ref 40–150)
ALT: 30 U/L (ref 0–55)
ANION GAP: 7 meq/L (ref 3–11)
AST: 33 U/L (ref 5–34)
Albumin: 3.7 g/dL (ref 3.5–5.0)
BUN: 11.5 mg/dL (ref 7.0–26.0)
CO2: 26 meq/L (ref 22–29)
Calcium: 9.2 mg/dL (ref 8.4–10.4)
Chloride: 109 mEq/L (ref 98–109)
Creatinine: 1 mg/dL (ref 0.6–1.1)
GLUCOSE: 113 mg/dL (ref 70–140)
Potassium: 4.6 mEq/L (ref 3.5–5.1)
SODIUM: 142 meq/L (ref 136–145)
TOTAL PROTEIN: 6.6 g/dL (ref 6.4–8.3)
Total Bilirubin: 0.52 mg/dL (ref 0.20–1.20)

## 2013-12-07 MED ORDER — ANASTROZOLE 1 MG PO TABS: 1.0000 mg | ORAL_TABLET | Freq: Every day | ORAL | Status: DC

## 2013-12-07 NOTE — Assessment & Plan Note (Addendum)
A. Stage IA right breast cancer diagnosed in March 2014 treated with lumpectomy. She was given 2 cycles of Taxotere and Cytoxan but she could not tolerate it so it was discontinued (due to high Oncotype DX recurrence score). Because she was ER/PR positive she was started on tamoxifen in September 2014. She has been tolerating it moderately well. She has profound hot flashes that cause major problems in her day-to-day life. She was prescribed alendronate for osteoporosis. We discussed the role of aromatase inhibitors and I recommended switching her to anastrozole 1 mg daily. I discussed the risks and benefits of treatment and I sent a prescription electronically to her mail-in pharmacy.  B. We discussed the risks and benefits of anti-estrogen therapy with aromatase inhibitors. These include but not limited to insomnia, hot flashes, mood changes, vaginal dryness, bone density loss, and weight gain. Although rare, serious side effects including, risk of blood clots were also discussed. We strongly believe that the benefits far outweigh the risks. Patient understands these risks and consented to starting treatment. Planned treatment duration is 4 more years.  C. Patient was given the following survivorship care plan  1. Annual mammograms for breast cancer surveillance with annual physical exams (last mammogram was done May 2015) 2. Clinic followups to once every 3 months for the first 2 years then every 6 months for the next 2 years and then annually thereafter. 3. Counseled the patient on diet and exercise. Encouraged eating more fruits and vegetables and less red meat. Encouraged patient to exercise daily for 30 minutes. 4. Counseled the patient on fatigue and issues related to cognitive dysfunction. Patient understands that only with exercise and active lifestyle that these symptoms can improve slowly over time. Physical therapy and occupational therapy were recommended to assist with her recovery. 5. The  following health screenings were recommended Bone density every other year to monitor for osteoporosis Regular cancer screening for other cancers as recommended by PCP

## 2013-12-07 NOTE — Progress Notes (Signed)
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 Stopped chemo.   10/26/2012 - 11/22/2012 Radiation Therapy Rsdiation to lumpectomy site   12/14/2012 -  Anti-estrogen oral therapy Tamoxifen 20 mg daily for 5-10 years    CHIEF COMPLIANT:  Severe hot flashes due to tamoxifen INTERVAL HISTORY:  [name] is a 73 year old Caucasian lady with above-mentioned history of breast cancer that was treated with surgery with lumpectomy radiation therapy and been on tamoxifen for the past one year. She has had profound hot flashes related to tamoxifen that have impaired her quality of life. She had hysterectomy previously so she does not have any issues with that. She requested any other alternatives to tamoxifen may be available to her. She had a mammogram in May 2015 which was normal. She denies any lumps or nodules in the breasts.  REVIEW OF SYSTEMS:   Constitutional: Denies fevers, chills or abnormal weight loss Eyes: Denies blurriness of vision Ears, nose, mouth, throat, and face: Denies mucositis or sore throat Respiratory:  Denies cough, dyspnea or wheezes Cardiovascular: Denies palpitation, chest discomfort or lower extremity swelling Gastrointestinal:  Denies nausea, heartburn or change in bowel habits Skin: Denies abnormal skin rashes Lymphatics: Denies new lymphadenopathy or easy bruising Neurological:Denies numbness, tingling or new weaknesses Behavioral/Psych: Mood is stable, no new changes  Breast: denies any pain or lumps or nodules in either breasts All other systems were reviewed with the patient and are negative.  I have reviewed the past medical history, past surgical history, social history and family history with the patient and they are unchanged from previous note.  ALLERGIES:  has No Known Allergies.  MEDICATIONS:  Current Outpatient Prescriptions  Medication Sig Dispense Refill  . alendronate (FOSAMAX) 70 MG tablet Take 70 mg by mouth once a week. Take with a full glass of water on an empty stomach.      Marland Kitchen b complex vitamins tablet Take 1 tablet by mouth daily.      . cetirizine (ZYRTEC) 10 MG tablet Take 10 mg by mouth daily.      Marland Kitchen levothyroxine (SYNTHROID, LEVOTHROID) 75 MCG tablet Take 75 mcg by mouth every morning.       . pantoprazole (PROTONIX) 40 MG tablet Take 40 mg by mouth every morning.       . rosuvastatin (CRESTOR) 10 MG tablet Take 10 mg by mouth daily.       . valACYclovir (VALTREX) 1000 MG tablet Take 1,000 mg by mouth 2 (two) times daily as needed (for cold sores).       . Vitamin D, Ergocalciferol, (DRISDOL) 50000 UNITS CAPS Take 5,000 Units by mouth every  7 (seven) days. Taken on Wednesdays.      Marland Kitchen anastrozole (ARIMIDEX) 1 MG tablet Take 1 tablet (1 mg total) by mouth daily.  90 tablet  3   No current facility-administered medications for this visit.    PHYSICAL EXAMINATION: ECOG PERFORMANCE STATUS: 0 - Asymptomatic  Filed Vitals:   12/07/13 1335  BP: 142/67  Pulse: 85  Temp: 98.4 F (36.9 C)  Resp: 18   Filed Weights   12/07/13 1335  Weight: 163 lb 9.6 oz  (74.208 kg)    GENERAL:alert, no distress and comfortable SKIN: skin color, texture, turgor are normal, no rashes or significant lesions EYES: normal, Conjunctiva are pink and non-injected, sclera clear OROPHARYNX:no exudate, no erythema and lips, buccal mucosa, and tongue normal  NECK: supple, thyroid normal size, non-tender, without nodularity LYMPH:  no palpable lymphadenopathy in the cervical, axillary or inguinal LUNGS: clear to auscultation and percussion with normal breathing effort HEART: regular rate & rhythm and no murmurs and no lower extremity edema ABDOMEN:abdomen soft, non-tender and normal bowel sounds Musculoskeletal:no cyanosis of digits and no clubbing  NEURO: alert & oriented x 3 with fluent speech, no focal motor/sensory deficits BREAST: No palpable masses lungs or nodules in either right or left breasts. No palpable axillary supraclavicular or infraclavicular adenopathy no breast tenderness or nipple discharge.   LABORATORY DATA:  I have reviewed the data as listed Appointment on 12/07/2013  Component Date Value Ref Range Status  . WBC 12/07/2013 7.9  3.9 - 10.3 10e3/uL Final  . NEUT# 12/07/2013 3.4  1.5 - 6.5 10e3/uL Final  . HGB 12/07/2013 13.6  11.6 - 15.9 g/dL Final  . HCT 12/07/2013 40.7  34.8 - 46.6 % Final  . Platelets 12/07/2013 209  145 - 400 10e3/uL Final  . MCV 12/07/2013 94.2  79.5 - 101.0 fL Final  . MCH 12/07/2013 31.4  25.1 - 34.0 pg Final  . MCHC 12/07/2013 33.3  31.5 - 36.0 g/dL Final  . RBC 12/07/2013 4.32  3.70 - 5.45 10e6/uL Final  . RDW 12/07/2013 13.8  11.2 - 14.5 % Final  . lymph# 12/07/2013 3.3  0.9 - 3.3 10e3/uL Final  . MONO# 12/07/2013 0.9  0.1 - 0.9 10e3/uL Final  . Eosinophils Absolute 12/07/2013 0.2  0.0 - 0.5 10e3/uL Final  . Basophils Absolute 12/07/2013 0.1  0.0 - 0.1 10e3/uL Final  . NEUT% 12/07/2013 43.0  38.4 - 76.8 % Final  . LYMPH% 12/07/2013 42.0  14.0 - 49.7 % Final  . MONO% 12/07/2013 11.4  0.0 - 14.0 % Final  . EOS%  12/07/2013 2.6  0.0 - 7.0 % Final  . BASO% 12/07/2013 1.0  0.0 - 2.0 % Final  . Sodium 12/07/2013 142  136 - 145 mEq/L Final  . Potassium 12/07/2013 4.6  3.5 - 5.1 mEq/L Final  . Chloride 12/07/2013 109  98 - 109 mEq/L Final  . CO2 12/07/2013 26  22 - 29 mEq/L Final  . Glucose 12/07/2013 113  70 - 140 mg/dl Final  . BUN 12/07/2013 11.5  7.0 - 26.0 mg/dL Final  . Creatinine 12/07/2013 1.0  0.6 - 1.1 mg/dL Final  . Total Bilirubin 12/07/2013 0.52  0.20 - 1.20 mg/dL Final  . Alkaline Phosphatase 12/07/2013 41  40 - 150 U/L Final  . AST 12/07/2013 33  5 - 34 U/L Final  . ALT 12/07/2013 30  0 - 55 U/L Final  . Total Protein 12/07/2013 6.6  6.4 - 8.3 g/dL Final  . Albumin 12/07/2013  3.7  3.5 - 5.0 g/dL Final  . Calcium 12/07/2013 9.2  8.4 - 10.4 mg/dL Final  . Anion Gap 12/07/2013 7  3 - 11 mEq/L Final    RADIOGRAPHIC STUDIES: I have personally reviewed the radiology reports and agreed with their findings. No results found.   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 Stage IA right breast cancer diagnosed in March 2014 treated with lumpectomy. She was given 2 cycles of Taxotere and Cytoxan but she could not tolerate it so it was discontinued (due to high Oncotype DX recurrence score). Because she was ER/PR positive she was started on tamoxifen in September 2014. She has been tolerating it moderately well. She has profound hot flashes that cause major problems in her day-to-day life. She was prescribed alendronate for osteoporosis. We discussed the role of aromatase inhibitors and I recommended switching her to anastrozole 1 mg daily. I discussed the risks and benefits of treatment and I sent a prescription electronically to her mail-in pharmacy.  B. We discussed the risks and benefits of anti-estrogen therapy with aromatase inhibitors. These include but not limited to insomnia, hot flashes, mood changes,  vaginal dryness, bone density loss, and weight gain. Although rare, serious side effects including, risk of blood clots were also discussed. We strongly believe that the benefits far outweigh the risks. Patient understands these risks and consented to starting treatment. Planned treatment duration is 4 more years.  C. Patient was given the following survivorship care plan  1. Annual mammograms for breast cancer surveillance with annual physical exams (last mammogram was done May 2015) 2. Clinic followups to once every 3 months for the first 2 years then every 6 months for the next 2 years and then annually thereafter. 3. Counseled the patient on diet and exercise. Encouraged eating more fruits and vegetables and less red meat. Encouraged patient to exercise daily for 30 minutes. 4. Counseled the patient on fatigue and issues related to cognitive dysfunction. Patient understands that only with exercise and active lifestyle that these symptoms can improve slowly over time. Physical therapy and occupational therapy were recommended to assist with her recovery. 5. The following health screenings were recommended Bone density every other year to monitor for osteoporosis Regular cancer screening for other cancers as recommended by PCP      No orders of the defined types were placed in this encounter.   The patient has a good understanding of the overall plan. she agrees with it. She will call with any problems that may develop before her next visit here.  I spent 25 minutes counseling the patient face to face. The total time spent in the appointment was 30 minutes and more than 50% was on counseling and review of test results    Rulon Eisenmenger, MD 12/07/2013 2:31 PM

## 2013-12-07 NOTE — Telephone Encounter (Signed)
per pof to sch pt appt-sch & gave pt copy of sch °

## 2013-12-09 ENCOUNTER — Telehealth: Payer: Self-pay | Admitting: *Deleted

## 2013-12-09 NOTE — Telephone Encounter (Signed)
Order for Arimidex faxed to [place]. Sent to scan.

## 2014-03-13 ENCOUNTER — Telehealth: Payer: Self-pay | Admitting: Hematology and Oncology

## 2014-03-13 ENCOUNTER — Ambulatory Visit (HOSPITAL_BASED_OUTPATIENT_CLINIC_OR_DEPARTMENT_OTHER): Payer: Medicare Other | Admitting: Hematology and Oncology

## 2014-03-13 ENCOUNTER — Ambulatory Visit: Payer: Medicare Other | Admitting: Hematology and Oncology

## 2014-03-13 VITALS — BP 143/60 | HR 98 | Temp 98.2°F | Resp 18 | Ht 62.0 in | Wt 162.1 lb

## 2014-03-13 DIAGNOSIS — C50311 Malignant neoplasm of lower-inner quadrant of right female breast: Secondary | ICD-10-CM

## 2014-03-13 DIAGNOSIS — Z79810 Long term (current) use of selective estrogen receptor modulators (SERMs): Secondary | ICD-10-CM

## 2014-03-13 DIAGNOSIS — C50919 Malignant neoplasm of unspecified site of unspecified female breast: Secondary | ICD-10-CM

## 2014-03-13 DIAGNOSIS — Z853 Personal history of malignant neoplasm of breast: Secondary | ICD-10-CM

## 2014-03-13 MED ORDER — VENLAFAXINE HCL ER 37.5 MG PO CP24: 37.5000 mg | ORAL_CAPSULE | Freq: Every day | ORAL | Status: DC

## 2014-03-13 NOTE — Progress Notes (Signed)
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 Stopped chemo.   10/26/2012 - 11/22/2012 Radiation Therapy Rsdiation to lumpectomy site   12/14/2012 -  Anti-estrogen oral therapy Tamoxifen 20 mg daily for 5-10 years    CHIEF COMPLIANT: severe hot flashes  INTERVAL HISTORY: Shelia Obrien is a 73 year old Caucasian with above-mentioned history of breast cancer on the right breast treated with lumpectomy and 2 cycles of chemotherapy and is currently on oral antiestrogen therapy. She could not tolerate tamoxifen because of hot flashes and we switched her to aromatase inhibitors with Arimidex. Her heart has continued to be a problem in spite of this change. She also has diffuse muscle aches and pains especially in the left side of the back.  REVIEW OF SYSTEMS:   Constitutional: Denies fevers, chills or abnormal weight loss Eyes: Denies blurriness of vision Ears, nose, mouth, throat, and face: Denies mucositis or sore throat Respiratory: Denies cough, dyspnea or wheezes Cardiovascular: Denies palpitation, chest discomfort or lower extremity swelling Gastrointestinal:  Denies nausea, heartburn or change in bowel  habits Skin: Denies abnormal skin rashes Lymphatics: Denies new lymphadenopathy or easy bruising Neurological:Denies numbness, tingling or new weaknesses Behavioral/Psych: Mood is stable, no new changes  Breast:  denies any pain or lumps or nodules in either breasts All other systems were reviewed with the patient and are negative.  I have reviewed the past medical history, past surgical history, social history and family history with the patient and they are unchanged from previous note.  ALLERGIES:  has No Known Allergies.  MEDICATIONS:  Current Outpatient Prescriptions  Medication Sig Dispense Refill  . anastrozole (ARIMIDEX) 1 MG tablet Take 1 tablet (1 mg total) by mouth daily. 90 tablet 3  . b complex vitamins tablet Take 1 tablet by mouth daily.    . cetirizine (ZYRTEC) 10 MG tablet Take 10 mg by mouth daily.    Marland Kitchen levothyroxine (SYNTHROID, LEVOTHROID) 75 MCG tablet Take 75 mcg by mouth every morning.     . pantoprazole (PROTONIX) 40 MG tablet Take 40 mg by mouth every morning.     . rosuvastatin (CRESTOR) 10 MG tablet Take 10 mg by mouth daily.     . valACYclovir (VALTREX) 1000 MG tablet Take 1,000 mg by mouth 2 (two) times daily as needed (for cold sores).     . Vitamin D, Ergocalciferol, (DRISDOL) 50000 UNITS CAPS Take 5,000 Units by mouth every 7 (seven) days. Taken on Wednesdays.    Marland Kitchen venlafaxine XR (EFFEXOR-XR) 37.5 MG 24 hr capsule Take 1 capsule (37.5 mg total) by mouth daily with breakfast. 30 capsule 11   No current facility-administered medications for this visit.    PHYSICAL  EXAMINATION: ECOG PERFORMANCE STATUS: 0 - Asymptomatic  Filed Vitals:   03/13/14 1425  BP: 143/60  Pulse: 98  Temp: 98.2 F (36.8 C)  Resp: 18   Filed Weights   03/13/14 1425  Weight: 162 lb 1.6 oz (73.528 kg)    GENERAL:alert, no distress and comfortable SKIN: skin color, texture, turgor are normal, no rashes or significant lesions EYES: normal, Conjunctiva are pink and  non-injected, sclera clear OROPHARYNX:no exudate, no erythema and lips, buccal mucosa, and tongue normal  NECK: supple, thyroid normal size, non-tender, without nodularity LYMPH:  no palpable lymphadenopathy in the cervical, axillary or inguinal LUNGS: clear to auscultation and percussion with normal breathing effort HEART: regular rate & rhythm and no murmurs and no lower extremity edema ABDOMEN:abdomen soft, non-tender and normal bowel sounds Musculoskeletal:no cyanosis of digits and no clubbing  NEURO: alert & oriented x 3 with fluent speech, no focal motor/sensory deficits  LABORATORY DATA:  I have reviewed the data as listed   Chemistry      Component Value Date/Time   NA 142 12/07/2013 1317   NA 141 10/28/2012 0925   K 4.6 12/07/2013 1317   K 4.9 10/28/2012 0925   CL 107 10/28/2012 0925   CL 104 09/28/2012 1347   CO2 26 12/07/2013 1317   CO2 28 10/28/2012 0925   BUN 11.5 12/07/2013 1317   BUN 10 10/28/2012 0925   CREATININE 1.0 12/07/2013 1317   CREATININE 0.81 10/28/2012 0925      Component Value Date/Time   CALCIUM 9.2 12/07/2013 1317   CALCIUM 9.7 10/28/2012 0925   ALKPHOS 41 12/07/2013 1317   ALKPHOS 43 09/23/2012 0553   AST 33 12/07/2013 1317   AST 14 09/23/2012 0553   ALT 30 12/07/2013 1317   ALT 14 09/23/2012 0553   BILITOT 0.52 12/07/2013 1317   BILITOT 1.3* 09/23/2012 0553       Lab Results  Component Value Date   WBC 7.9 12/07/2013   HGB 13.6 12/07/2013   HCT 40.7 12/07/2013   MCV 94.2 12/07/2013   PLT 209 12/07/2013   NEUTROABS 3.4 12/07/2013    ASSESSMENT & PLAN:  Breast cancer of lower-inner quadrant of right female breast Stage IA right breast cancer diagnosed in March 2014 treated with lumpectomy. She was given 2 cycles of Taxotere and Cytoxan but she could not tolerate it so it was discontinued (due to high Oncotype DX recurrence score). Because she was ER/PR positive she was started on tamoxifen in September 2014.   Aromatase inhibitor  toxicities: 1. Hot flashes 2. Muscle aches and pains especially left side of the back  Due to severe hot flashes, sleep and was changed to Arimidex and she continues to have the same amount of hot flashes. I recommended treating her with Effexor 37.5 mg tablet daily. I sent a prescription to [place]. Return to clinic in 3 months for followup to assess tolerance to treatment. Monitoring closely for toxicities. Mr. The patient to call is if she develops any problems with Effexor.   No orders of the defined types were placed in this encounter.   The patient has a good understanding of the overall plan. she agrees with it. She will call with any problems that may develop before her next visit here.   Rulon Eisenmenger, MD 03/13/2014 2:43 PM

## 2014-03-13 NOTE — Assessment & Plan Note (Signed)
Stage IA right breast cancer diagnosed in March 2014 treated with lumpectomy. She was given 2 cycles of Taxotere and Cytoxan but she could not tolerate it so it was discontinued (due to high Oncotype DX recurrence score). Because she was ER/PR positive she was started on tamoxifen in September 2014.   Aromatase inhibitor toxicities: 1. Hot flashes 2. Muscle aches and pains especially left side of the back  Due to severe hot flashes, sleep and was changed to Arimidex and she continues to have the same amount of hot flashes. I recommended treating her with Effexor 37.5 mg tablet daily. I sent a prescription to [place]. Return to clinic in 3 months for followup to assess tolerance to treatment. Monitoring closely for toxicities. Mr. The patient to call is if she develops any problems with Effexor.

## 2014-05-01 ENCOUNTER — Telehealth: Payer: Self-pay | Admitting: Hematology and Oncology

## 2014-05-01 NOTE — Telephone Encounter (Signed)
s.w. pt and advised on Feb 25 appt moved to 2.24 due to MD on call....pt ok and aware

## 2014-05-18 ENCOUNTER — Ambulatory Visit
Admission: RE | Admit: 2014-05-18 | Discharge: 2014-05-18 | Disposition: A | Discharge status: Home / Self Care | Payer: Medicare Other | Source: Ambulatory Visit | Attending: Internal Medicine | Admitting: Internal Medicine

## 2014-05-18 ENCOUNTER — Other Ambulatory Visit: Payer: Self-pay | Admitting: Internal Medicine

## 2014-05-18 DIAGNOSIS — R5383 Other fatigue: Secondary | ICD-10-CM | POA: Diagnosis not present

## 2014-05-18 DIAGNOSIS — M545 Low back pain: Secondary | ICD-10-CM

## 2014-05-18 DIAGNOSIS — E039 Hypothyroidism, unspecified: Secondary | ICD-10-CM | POA: Diagnosis not present

## 2014-05-18 DIAGNOSIS — G4700 Insomnia, unspecified: Secondary | ICD-10-CM | POA: Diagnosis not present

## 2014-05-18 DIAGNOSIS — Z6828 Body mass index (BMI) 28.0-28.9, adult: Secondary | ICD-10-CM | POA: Diagnosis not present

## 2014-05-18 DIAGNOSIS — Z853 Personal history of malignant neoplasm of breast: Secondary | ICD-10-CM | POA: Diagnosis not present

## 2014-06-07 ENCOUNTER — Telehealth: Payer: Self-pay | Admitting: Hematology and Oncology

## 2014-06-07 ENCOUNTER — Ambulatory Visit (HOSPITAL_BASED_OUTPATIENT_CLINIC_OR_DEPARTMENT_OTHER): Payer: Medicare Other | Admitting: Hematology and Oncology

## 2014-06-07 VITALS — BP 150/83 | HR 91 | Temp 98.2°F | Resp 18 | Ht 62.0 in | Wt 161.1 lb

## 2014-06-07 DIAGNOSIS — C50311 Malignant neoplasm of lower-inner quadrant of right female breast: Secondary | ICD-10-CM

## 2014-06-07 MED ORDER — ANASTROZOLE 1 MG PO TABS: 1.0000 mg | ORAL_TABLET | Freq: Every day | ORAL | Status: DC

## 2014-06-07 NOTE — Progress Notes (Signed)
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 Stopped chemo.   10/26/2012 - 11/22/2012 Radiation Therapy Rsdiation to lumpectomy site   12/14/2012 -  Anti-estrogen oral therapy Tamoxifen 20 mg daily until August 2015 switched to Arimidex 1 mg daily (given along with Effexor)    CHIEF COMPLIANT: follow-up of breast cancer severe hot flashes  INTERVAL HISTORY: Shelia Obrien is a 74 year old with above-mentioned history of right-sided breast cancer treated with lumpectomy followed by adjuvant chemotherapy 2 cycles of radiation and is currently on tamoxifen since September 2014. She is tolerating it fairly well except for severe hot flashes. We have tried to give her Effexor but it made her difficulty with sleeping and side effects which resulted in discontinuation of Effexor. So she continues to deal with hot flashes on a day-to-day basis.  REVIEW OF SYSTEMS:   Constitutional: Denies fevers, chills or abnormal weight loss Eyes: Denies blurriness of vision Ears, nose, mouth, throat, and face: Denies mucositis or sore throat Respiratory: Denies cough, dyspnea or wheezes Cardiovascular: Denies palpitation, chest  discomfort or lower extremity swelling Gastrointestinal:  Denies nausea, heartburn or change in bowel habits Skin: Denies abnormal skin rashes Lymphatics: Denies new lymphadenopathy or easy bruising Neurological:Denies numbness, tingling or new weaknesses Behavioral/Psych: Mood is stable, no new changes  Breast:  denies any pain or lumps or nodules in either breasts All other systems were reviewed with the patient and are negative.  I have reviewed the past medical history, past surgical history, social history and family history with the patient and they are unchanged from previous note.  ALLERGIES:  has No Known Allergies.  MEDICATIONS:  Current Outpatient Prescriptions  Medication Sig Dispense Refill  . alendronate (FOSAMAX) 70 MG tablet     . anastrozole (ARIMIDEX) 1 MG tablet Take 1 tablet (1 mg total) by mouth daily. 90 tablet 3  . b complex vitamins tablet Take 1 tablet by mouth daily.    . cetirizine (ZYRTEC) 10 MG tablet Take 10 mg by mouth daily.    Marland Kitchen levothyroxine (SYNTHROID, LEVOTHROID) 75 MCG tablet Take 75 mcg by mouth every morning.     . pantoprazole (PROTONIX) 40 MG tablet Take 40 mg by mouth every morning.     . rosuvastatin (CRESTOR) 10 MG tablet Take 10 mg by mouth daily.     Marland Kitchen UNABLE TO FIND Benafiber 1 teaspoon daily    . valACYclovir (VALTREX) 1000 MG tablet Take 1,000 mg by mouth 2 (two) times daily as needed (for cold sores).     . Vitamin D, Ergocalciferol, (DRISDOL) 50000 UNITS CAPS Take 5,000 Units by mouth every 7 (seven) days. Taken on Wednesdays.    Marland Kitchen zolpidem (  AMBIEN) 5 MG tablet      No current facility-administered medications for this visit.    PHYSICAL EXAMINATION: ECOG PERFORMANCE STATUS: 1 - Symptomatic but completely ambulatory  Filed Vitals:   06/07/14 1327  BP: 150/83  Pulse: 91  Temp: 98.2 F (36.8 C)  Resp: 18   Filed Weights   06/07/14 1327  Weight: 161 lb 1.6 oz (73.074 kg)    GENERAL:alert, no distress and comfortable SKIN:  skin color, texture, turgor are normal, no rashes or significant lesions EYES: normal, Conjunctiva are pink and non-injected, sclera clear OROPHARYNX:no exudate, no erythema and lips, buccal mucosa, and tongue normal  NECK: supple, thyroid normal size, non-tender, without nodularity LYMPH:  no palpable lymphadenopathy in the cervical, axillary or inguinal LUNGS: clear to auscultation and percussion with normal breathing effort HEART: regular rate & rhythm and no murmurs and no lower extremity edema ABDOMEN:abdomen soft, non-tender and normal bowel sounds Musculoskeletal:no cyanosis of digits and no clubbing  NEURO: alert & oriented x 3 with fluent speech, no focal motor/sensory deficits BREAST: No palpable masses or nodules in either right or left breasts. No palpable axillary supraclavicular or infraclavicular adenopathy no breast tenderness or nipple discharge. (exam performed in the presence of a chaperone)  LABORATORY DATA:  I have reviewed the data as listed   Chemistry      Component Value Date/Time   NA 142 12/07/2013 1317   NA 141 10/28/2012 0925   K 4.6 12/07/2013 1317   K 4.9 10/28/2012 0925   CL 107 10/28/2012 0925   CL 104 09/28/2012 1347   CO2 26 12/07/2013 1317   CO2 28 10/28/2012 0925   BUN 11.5 12/07/2013 1317   BUN 10 10/28/2012 0925   CREATININE 1.0 12/07/2013 1317   CREATININE 0.81 10/28/2012 0925      Component Value Date/Time   CALCIUM 9.2 12/07/2013 1317   CALCIUM 9.7 10/28/2012 0925   ALKPHOS 41 12/07/2013 1317   ALKPHOS 43 09/23/2012 0553   AST 33 12/07/2013 1317   AST 14 09/23/2012 0553   ALT 30 12/07/2013 1317   ALT 14 09/23/2012 0553   BILITOT 0.52 12/07/2013 1317   BILITOT 1.3* 09/23/2012 0553       Lab Results  Component Value Date   WBC 7.9 12/07/2013   HGB 13.6 12/07/2013   HCT 40.7 12/07/2013   MCV 94.2 12/07/2013   PLT 209 12/07/2013   NEUTROABS 3.4 12/07/2013   ASSESSMENT & PLAN:  Breast cancer of lower-inner quadrant of right  female breast Stage IA right breast cancer diagnosed in March 2014 treated with lumpectomy. She was given 2 cycles of Taxotere and Cytoxan but she could not tolerate it so it was discontinued (due to high Oncotype DX recurrence score). Because she was ER/PR positive she was started on tamoxifen in September 2014.   Aromatase inhibitor toxicities: 1. Hot flashes:could not tolerate Effexor XR, I recommended that she do yoga 2. Muscle aches and pains especially left side of the back  Breast cancer surveillance: 1. Breast exam 06/07/2014 is normal 2. Mammogram 06/22/2013 is normal next mammogram is due in March 2016  Return to clinic in 6 months for follow-up No orders of the defined types were placed in this encounter.   The patient has a good understanding of the overall plan. she agrees with it. She will call with any problems that may develop before her next visit here.   ,  K, MD    

## 2014-06-07 NOTE — Telephone Encounter (Signed)
per pof to sch pt appt-gave pt copy of sch °

## 2014-06-07 NOTE — Assessment & Plan Note (Signed)
Stage IA right breast cancer diagnosed in March 2014 treated with lumpectomy. She was given 2 cycles of Taxotere and Cytoxan but she could not tolerate it so it was discontinued (due to high Oncotype DX recurrence score). Because she was ER/PR positive she was started on tamoxifen in September 2014.   Aromatase inhibitor toxicities: 1. Hot flashes: Currently on Effexor XR 2. Muscle aches and pains especially left side of the back  Breast cancer surveillance: 1. Breast exam 06/07/2014 is normal 2. Mammogram 06/22/2013 is normal next mammogram is due in March 2016  Return to clinic in 6 months for follow-up

## 2014-06-08 ENCOUNTER — Ambulatory Visit: Payer: Medicare Other | Admitting: Hematology and Oncology

## 2014-06-08 ENCOUNTER — Other Ambulatory Visit: Payer: Self-pay | Admitting: Nurse Practitioner

## 2014-07-17 DIAGNOSIS — N6489 Other specified disorders of breast: Secondary | ICD-10-CM | POA: Diagnosis not present

## 2014-07-17 DIAGNOSIS — E039 Hypothyroidism, unspecified: Secondary | ICD-10-CM | POA: Diagnosis not present

## 2014-07-17 DIAGNOSIS — Z853 Personal history of malignant neoplasm of breast: Secondary | ICD-10-CM | POA: Diagnosis not present

## 2014-08-14 DIAGNOSIS — H25013 Cortical age-related cataract, bilateral: Secondary | ICD-10-CM | POA: Diagnosis not present

## 2014-09-12 DIAGNOSIS — R8299 Other abnormal findings in urine: Secondary | ICD-10-CM | POA: Diagnosis not present

## 2014-09-12 DIAGNOSIS — N390 Urinary tract infection, site not specified: Secondary | ICD-10-CM | POA: Diagnosis not present

## 2014-09-12 DIAGNOSIS — N39498 Other specified urinary incontinence: Secondary | ICD-10-CM | POA: Diagnosis not present

## 2014-10-11 DIAGNOSIS — E039 Hypothyroidism, unspecified: Secondary | ICD-10-CM | POA: Diagnosis not present

## 2014-10-11 DIAGNOSIS — E785 Hyperlipidemia, unspecified: Secondary | ICD-10-CM | POA: Diagnosis not present

## 2014-10-11 DIAGNOSIS — I10 Essential (primary) hypertension: Secondary | ICD-10-CM | POA: Diagnosis not present

## 2014-10-11 DIAGNOSIS — E559 Vitamin D deficiency, unspecified: Secondary | ICD-10-CM | POA: Diagnosis not present

## 2014-10-11 DIAGNOSIS — R7301 Impaired fasting glucose: Secondary | ICD-10-CM | POA: Diagnosis not present

## 2014-10-18 DIAGNOSIS — R1909 Other intra-abdominal and pelvic swelling, mass and lump: Secondary | ICD-10-CM | POA: Diagnosis not present

## 2014-10-18 DIAGNOSIS — G4700 Insomnia, unspecified: Secondary | ICD-10-CM | POA: Diagnosis not present

## 2014-10-18 DIAGNOSIS — I10 Essential (primary) hypertension: Secondary | ICD-10-CM | POA: Diagnosis not present

## 2014-10-18 DIAGNOSIS — Z6828 Body mass index (BMI) 28.0-28.9, adult: Secondary | ICD-10-CM | POA: Diagnosis not present

## 2014-10-18 DIAGNOSIS — C50919 Malignant neoplasm of unspecified site of unspecified female breast: Secondary | ICD-10-CM | POA: Diagnosis not present

## 2014-10-18 DIAGNOSIS — N951 Menopausal and female climacteric states: Secondary | ICD-10-CM | POA: Diagnosis not present

## 2014-10-18 DIAGNOSIS — M545 Low back pain: Secondary | ICD-10-CM | POA: Diagnosis not present

## 2014-10-18 DIAGNOSIS — M1990 Unspecified osteoarthritis, unspecified site: Secondary | ICD-10-CM | POA: Diagnosis not present

## 2014-10-18 DIAGNOSIS — Z1389 Encounter for screening for other disorder: Secondary | ICD-10-CM | POA: Diagnosis not present

## 2014-10-18 DIAGNOSIS — Z1231 Encounter for screening mammogram for malignant neoplasm of breast: Secondary | ICD-10-CM | POA: Diagnosis not present

## 2014-10-18 DIAGNOSIS — M5380 Other specified dorsopathies, site unspecified: Secondary | ICD-10-CM | POA: Diagnosis not present

## 2014-10-18 DIAGNOSIS — Z0000 Encounter for general adult medical examination without abnormal findings: Secondary | ICD-10-CM | POA: Diagnosis not present

## 2014-10-24 DIAGNOSIS — Z1212 Encounter for screening for malignant neoplasm of rectum: Secondary | ICD-10-CM | POA: Diagnosis not present

## 2014-12-07 ENCOUNTER — Telehealth: Payer: Self-pay | Admitting: Hematology and Oncology

## 2014-12-07 ENCOUNTER — Encounter: Payer: Self-pay | Admitting: Hematology and Oncology

## 2014-12-07 ENCOUNTER — Ambulatory Visit (HOSPITAL_BASED_OUTPATIENT_CLINIC_OR_DEPARTMENT_OTHER): Payer: Medicare Other | Admitting: Hematology and Oncology

## 2014-12-07 VITALS — BP 157/64 | HR 89 | Temp 97.0°F | Resp 18 | Ht 62.0 in | Wt 164.8 lb

## 2014-12-07 DIAGNOSIS — Z79810 Long term (current) use of selective estrogen receptor modulators (SERMs): Secondary | ICD-10-CM | POA: Diagnosis not present

## 2014-12-07 DIAGNOSIS — Z170 Estrogen receptor positive status [ER+]: Secondary | ICD-10-CM

## 2014-12-07 DIAGNOSIS — C50311 Malignant neoplasm of lower-inner quadrant of right female breast: Secondary | ICD-10-CM

## 2014-12-07 NOTE — Telephone Encounter (Signed)
Gave and printed appt sched adn avs for pt for Aug 2017

## 2014-12-07 NOTE — Progress Notes (Signed)
Mammogram and ultrasound rcvd from [facility]  Reviewed by Dr. Lindi Adie.  Sent to scan.

## 2014-12-07 NOTE — Progress Notes (Signed)
Patient Care Team: Crist Infante, MD as PCP - General (Internal Medicine)  DIAGNOSIS: Breast cancer of lower-inner quadrant of right female breast   Staging form: Breast, AJCC 7th Edition     Clinical: Stage IA (T1c, N0, cM0) - Unsigned       Staging comments: Staged at breast conference 3.26.14      Pathologic: No stage assigned - Unsigned   SUMMARY OF ONCOLOGIC HISTORY:   Breast cancer of lower-inner quadrant of right female breast   07/01/2012 Initial Diagnosis Breast cancer of lower-inner quadrant of right female breast, 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 Stopped chemo.   10/26/2012 - 11/22/2012 Radiation Therapy Rsdiation to lumpectomy site   12/14/2012 -  Anti-estrogen oral therapy Tamoxifen 20 mg daily until August 2015 switched to Arimidex 1 mg daily (given along with Effexor)    CHIEF COMPLIANT: Continues to have hot flashes and musculoskeletal aches and pains  INTERVAL HISTORY: Shelia Obrien is a 74 year old with above-mentioned history of right breast cancer currently on Arimidex tolerating it fairly well. She does have hot flashes and muscle discomfort and aches and pains for which she is seeing a Restaurant manager, fast food. Her symptoms have improved significantly. She wishes to undergo massage therapy. She denies any pain or discomfort in the breast. Recent mammogram and ultrasound were done at [facility]. The mammogram showed an indeterminate indeterminate abnormality which was evaluated by ultrasound was felt to be normal.  REVIEW OF SYSTEMS:   Constitutional: Denies fevers, chills or abnormal weight loss Eyes: Denies blurriness of vision Ears, nose, mouth, throat, and face: Denies mucositis or sore throat Respiratory: Denies cough,  dyspnea or wheezes Cardiovascular: Denies palpitation, chest discomfort or lower extremity swelling Gastrointestinal:  Denies nausea, heartburn or change in bowel habits Skin: Denies abnormal skin rashes Lymphatics: Denies new lymphadenopathy or easy bruising Neurological:Denies numbness, tingling or new weaknesses Behavioral/Psych: Mood is stable, no new changes  Breast:  denies any pain or lumps or nodules in either breasts All other systems were reviewed with the patient and are negative.  I have reviewed the past medical history, past surgical history, social history and family history with the patient and they are unchanged from previous note.  ALLERGIES:  has No Known Allergies.  MEDICATIONS:  Current Outpatient Prescriptions  Medication Sig Dispense Refill  . alendronate (FOSAMAX) 70 MG tablet     . anastrozole (ARIMIDEX) 1 MG tablet Take 1 tablet (1 mg total) by mouth daily. 90 tablet 3  . b complex vitamins tablet Take 1 tablet by mouth daily.    . cetirizine (ZYRTEC) 10 MG tablet Take 10 mg by mouth daily.    Marland Kitchen levothyroxine (SYNTHROID, LEVOTHROID) 75 MCG tablet Take 75 mcg by mouth every morning.     Marland Kitchen losartan (COZAAR) 25 MG tablet     . pantoprazole (PROTONIX) 40 MG tablet Take 40 mg by mouth every morning.     . rosuvastatin (CRESTOR) 10 MG tablet Take 10 mg by mouth daily.     Marland Kitchen UNABLE TO FIND Benafiber 1 teaspoon daily    . valACYclovir (VALTREX) 1000 MG tablet Take 1,000 mg by mouth 2 (two) times daily as needed (for cold sores).     . Vitamin D, Ergocalciferol, (DRISDOL) 50000 UNITS  CAPS Take 5,000 Units by mouth every 14 (fourteen) days. Taken on Wednesdays.     No current facility-administered medications for this visit.    PHYSICAL EXAMINATION: ECOG PERFORMANCE STATUS: 1 - Symptomatic but completely ambulatory  Filed Vitals:   12/07/14 1040  BP: 157/64  Pulse: 89  Temp: 97 F (36.1 C)  Resp: 18   Filed Weights   12/07/14 1040  Weight: 164 lb 12.8 oz  (74.753 kg)    GENERAL:alert, no distress and comfortable SKIN: skin color, texture, turgor are normal, no rashes or significant lesions EYES: normal, Conjunctiva are pink and non-injected, sclera clear OROPHARYNX:no exudate, no erythema and lips, buccal mucosa, and tongue normal  NECK: supple, thyroid normal size, non-tender, without nodularity LYMPH:  no palpable lymphadenopathy in the cervical, axillary or inguinal LUNGS: clear to auscultation and percussion with normal breathing effort HEART: regular rate & rhythm and no murmurs and no lower extremity edema ABDOMEN:abdomen soft, non-tender and normal bowel sounds Musculoskeletal:no cyanosis of digits and no clubbing  NEURO: alert & oriented x 3 with fluent speech, no focal motor/sensory deficits  LABORATORY DATA:  I have reviewed the data as listed   Chemistry      Component Value Date/Time   NA 142 12/07/2013 1317   NA 141 10/28/2012 0925   K 4.6 12/07/2013 1317   K 4.9 10/28/2012 0925   CL 107 10/28/2012 0925   CL 104 09/28/2012 1347   CO2 26 12/07/2013 1317   CO2 28 10/28/2012 0925   BUN 11.5 12/07/2013 1317   BUN 10 10/28/2012 0925   CREATININE 1.0 12/07/2013 1317   CREATININE 0.81 10/28/2012 0925      Component Value Date/Time   CALCIUM 9.2 12/07/2013 1317   CALCIUM 9.7 10/28/2012 0925   ALKPHOS 41 12/07/2013 1317   ALKPHOS 43 09/23/2012 0553   AST 33 12/07/2013 1317   AST 14 09/23/2012 0553   ALT 30 12/07/2013 1317   ALT 14 09/23/2012 0553   BILITOT 0.52 12/07/2013 1317   BILITOT 1.3* 09/23/2012 0553       Lab Results  Component Value Date   WBC 7.9 12/07/2013   HGB 13.6 12/07/2013   HCT 40.7 12/07/2013   MCV 94.2 12/07/2013   PLT 209 12/07/2013   NEUTROABS 3.4 12/07/2013   ASSESSMENT & PLAN:  Breast cancer of lower-inner quadrant of right female breast Stage IA right breast cancer diagnosed in March 2014 treated with lumpectomy. She was given 2 cycles of Taxotere and Cytoxan but she could not  tolerate it so it was discontinued (due to high Oncotype DX recurrence score). Because she was ER/PR positive she was started on tamoxifen in September 2014.   Aromatase inhibitor toxicities: 1. Hot flashes:could not tolerate Effexor XR, I recommended that she do yoga 2. Muscle aches and pains especially left side of the back  Breast cancer surveillance: 1. Breast exam 12/07/2014 is normal 2. Mammogram 07/17/2014 possible asymmetry in the left breast evaluated by ultrasound no abnormalities were seen. Breast density category B  Return to clinic in 1 year for follow-up   No orders of the defined types were placed in this encounter.   The patient has a good understanding of the overall plan. she agrees with it. she will call with any problems that may develop before the next visit here.   Rulon Eisenmenger, MD

## 2014-12-07 NOTE — Assessment & Plan Note (Signed)
Stage IA right breast cancer diagnosed in March 2014 treated with lumpectomy. She was given 2 cycles of Taxotere and Cytoxan but she could not tolerate it so it was discontinued (due to high Oncotype DX recurrence score). Because she was ER/PR positive she was started on tamoxifen in September 2014.   Aromatase inhibitor toxicities: 1. Hot flashes:could not tolerate Effexor XR, I recommended that she do yoga 2. Muscle aches and pains especially left side of the back  Breast cancer surveillance: 1. Breast exam 12/07/2014 is normal 2. Mammogram   Return to clinic in 6 months for follow-up

## 2014-12-30 DIAGNOSIS — Z23 Encounter for immunization: Secondary | ICD-10-CM | POA: Diagnosis not present

## 2015-01-22 DIAGNOSIS — J209 Acute bronchitis, unspecified: Secondary | ICD-10-CM | POA: Diagnosis not present

## 2015-01-22 DIAGNOSIS — R509 Fever, unspecified: Secondary | ICD-10-CM | POA: Diagnosis not present

## 2015-01-22 DIAGNOSIS — Z6828 Body mass index (BMI) 28.0-28.9, adult: Secondary | ICD-10-CM | POA: Diagnosis not present

## 2015-01-22 DIAGNOSIS — R05 Cough: Secondary | ICD-10-CM | POA: Diagnosis not present

## 2015-01-30 DIAGNOSIS — Z6828 Body mass index (BMI) 28.0-28.9, adult: Secondary | ICD-10-CM | POA: Diagnosis not present

## 2015-01-30 DIAGNOSIS — L0889 Other specified local infections of the skin and subcutaneous tissue: Secondary | ICD-10-CM | POA: Diagnosis not present

## 2015-01-31 ENCOUNTER — Ambulatory Visit (INDEPENDENT_AMBULATORY_CARE_PROVIDER_SITE_OTHER): Payer: Medicare Other | Admitting: Podiatry

## 2015-01-31 ENCOUNTER — Encounter: Payer: Self-pay | Admitting: Podiatry

## 2015-01-31 VITALS — BP 158/96 | HR 64 | Resp 12

## 2015-01-31 DIAGNOSIS — L03032 Cellulitis of left toe: Secondary | ICD-10-CM

## 2015-01-31 DIAGNOSIS — N6489 Other specified disorders of breast: Secondary | ICD-10-CM | POA: Diagnosis not present

## 2015-01-31 DIAGNOSIS — L02612 Cutaneous abscess of left foot: Secondary | ICD-10-CM | POA: Diagnosis not present

## 2015-01-31 NOTE — Progress Notes (Signed)
   Subjective:    Patient ID: Shelia Obrien, female    DOB: January 14, 1941, 74 y.o.   MRN: [id number]  HPI  L  Yesterday patient presents today complaining of approximately 1 week history of pain in the left great toenail area. She has soak the toe tea tree oil. Her medical doctor prescribed doxycycline 100 mg twice a day which he started yesterday for the toe infection referred her for follow-up care. She relates a history of approximately a month ago dropping a range on the left hallux, however, the symptoms did not begin until about a week ago  Review of Systems  HENT: Positive for sinus pressure.   Respiratory: Positive for cough.   Musculoskeletal: Positive for back pain.  All other systems reviewed and are negative.      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 Left hallux was blocked with 3 mL 50-50 mixture of 2% plain Xylocaine and 0.5% plain Marcaine. The toe is painted with Betadine and exsanguinated. The distal one third of left hallux was debrided from the nailbed revealing a small abscess in the nailbed. The abscess was punctured and a small amount of green purulent drainage was expressed. An antibiotic dressing was applied and the  tourniquet was released. Spontaneous capillary filling time noted in the left hallux. Patient tolerated procedure without any difficulty  Patient advised to complete previous Rx of doxycycline 100 mg twice a day until gone Apply topical antibiotic ointment and a Band-Aid left hallux daily until healed  Reappoint at patient's request

## 2015-01-31 NOTE — Patient Instructions (Signed)
Complete your existing prescription for doxycycline 100 mg one twice a day until gone Remove bandage on left great toe in 24 hours and apply topical antibiotic ointment and Band-Aid daily until all drainage subsides If you notice increased drainage okay to soak toe in dishwashing soap and apply topical antibiotic ointment

## 2015-03-23 ENCOUNTER — Other Ambulatory Visit: Payer: Self-pay | Admitting: Hematology and Oncology

## 2015-07-26 ENCOUNTER — Encounter: Payer: Self-pay | Admitting: Hematology and Oncology

## 2015-12-06 ENCOUNTER — Ambulatory Visit (HOSPITAL_BASED_OUTPATIENT_CLINIC_OR_DEPARTMENT_OTHER): Payer: Medicare Other | Admitting: Hematology and Oncology

## 2015-12-06 ENCOUNTER — Encounter: Payer: Self-pay | Admitting: Hematology and Oncology

## 2015-12-06 ENCOUNTER — Telehealth: Payer: Self-pay | Admitting: Hematology and Oncology

## 2015-12-06 DIAGNOSIS — Z170 Estrogen receptor positive status [ER+]: Secondary | ICD-10-CM | POA: Diagnosis not present

## 2015-12-06 DIAGNOSIS — Z79811 Long term (current) use of aromatase inhibitors: Secondary | ICD-10-CM | POA: Diagnosis not present

## 2015-12-06 DIAGNOSIS — C50311 Malignant neoplasm of lower-inner quadrant of right female breast: Secondary | ICD-10-CM

## 2015-12-06 NOTE — Assessment & Plan Note (Signed)
Stage IA right breast cancer diagnosed in March 2014 treated with lumpectomy. She was given 2 cycles of Taxotere and Cytoxan but she could not tolerate it so it was discontinued (due to high Oncotype DX recurrence score). Because she was ER/PR positive she was started on tamoxifen in September 2014 switched to anastrozole August 2015.   Aromatase inhibitor toxicities: 1. Hot flashes:could not tolerate Effexor XR, I recommended that she do yoga 2. Muscle aches and pains especially left side of the back  Breast cancer surveillance: 1. Breast exam 12/06/2015 is normal 2. Mammogram April 2017 no abnormalities were seen. Breast density category B  Return to clinic in 1 year for follow-up

## 2015-12-06 NOTE — Telephone Encounter (Signed)
appt made and avs printed °

## 2015-12-06 NOTE — Progress Notes (Signed)
Patient Care Team: Crist Infante, MD as PCP - General (Internal Medicine)  DIAGNOSIS: Breast cancer of lower-inner quadrant of right female breast [facility])   Staging form: Breast, AJCC 7th Edition   - Clinical: Stage IA (T1c, N0, cM0) - Unsigned         Staging comments: Staged at breast conference 3.26.14    - Pathologic: No stage assigned - Unsigned  SUMMARY OF ONCOLOGIC HISTORY:   Breast cancer of lower-inner quadrant of right female breast ([place])   07/01/2012 Initial Diagnosis    Breast cancer of lower-inner quadrant of right female breast, BCT 07/2012, high oncotype, poorly tolerated chemo      07/23/2012 Surgery    Lumpectomy with SLN biopsy, 1.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 Stopped chemo.      10/26/2012 - 11/22/2012 Radiation Therapy    Rsdiation to lumpectomy site      12/14/2012 -  Anti-estrogen oral therapy    Tamoxifen 20 mg daily until August 2015 switched to Arimidex 1 mg daily (given along with Effexor)       CHIEF COMPLIANT: Follow-up on anastrozole  INTERVAL HISTORY: Shelia Obrien is a 75 year old with above-mentioned history of right breast cancer who received adjuvant chemotherapy followed by radiation and is currently on antiestrogen therapy with Arimidex. She does not report any problems taking the Arimidex approximately the hot flashes that she has had previously. Hot flashes have improved slightly. She does not have any significant musculoskeletal aches and pains.  REVIEW OF SYSTEMS:   Constitutional: Denies fevers, chills or abnormal weight loss Eyes: Denies blurriness of vision Ears, nose, mouth, throat, and face: Denies mucositis or sore throat Respiratory: Denies cough, dyspnea or wheezes Cardiovascular: Denies palpitation, chest discomfort Gastrointestinal:  Denies nausea,  heartburn or change in bowel habits Skin: Denies abnormal skin rashes Lymphatics: Denies new lymphadenopathy or easy bruising Neurological:Denies numbness, tingling or new weaknesses Behavioral/Psych: Mood is stable, no new changes  Extremities: No lower extremity edema Breast:  denies any pain or lumps or nodules in either breasts All other systems were reviewed with the patient and are negative.  I have reviewed the past medical history, past surgical history, social history and family history with the patient and they are unchanged from previous note.  ALLERGIES:  has No Known Allergies.  MEDICATIONS:  Current Outpatient Prescriptions  Medication Sig Dispense Refill  . alendronate (FOSAMAX) 70 MG tablet     . anastrozole (ARIMIDEX) 1 MG tablet TAKE 1 TABLET EVERY DAY 90 tablet 3  . b complex vitamins tablet Take 1 tablet by mouth daily.    . cetirizine (ZYRTEC) 10 MG tablet Take 10 mg by mouth daily.    Marland Kitchen doxycycline (VIBRAMYCIN) 100 MG capsule     . levothyroxine (SYNTHROID, LEVOTHROID) 75 MCG tablet Take 75 mcg by mouth every morning.     Marland Kitchen losartan (COZAAR) 25 MG tablet     . pantoprazole (PROTONIX) 40 MG tablet Take 40 mg by mouth every morning.     . predniSONE (STERAPRED UNI-PAK 21 TAB) 5 MG (21) TBPK tablet     . rosuvastatin (CRESTOR) 10 MG tablet Take 10 mg by mouth daily.     Marland Kitchen UNABLE TO FIND Benafiber 1 teaspoon daily    . valACYclovir (VALTREX) 1000 MG tablet Take  1,000 mg by mouth 2 (two) times daily as needed (for cold sores).     . Vitamin D, Ergocalciferol, (DRISDOL) 50000 UNITS CAPS Take 5,000 Units by mouth every 14 (fourteen) days. Taken on Wednesdays.     No current facility-administered medications for this visit.     PHYSICAL EXAMINATION: ECOG PERFORMANCE STATUS: 1 - Symptomatic but completely ambulatory  Vitals:   12/06/15 1125  BP: (!) 145/69  Pulse: 76  Resp: 18  Temp: 98 F (36.7 C)   Filed Weights   12/06/15 1125  Weight: 161 lb 14.4 oz  (73.4 kg)    GENERAL:alert, no distress and comfortable SKIN: skin color, texture, turgor are normal, no rashes or significant lesions EYES: normal, Conjunctiva are pink and non-injected, sclera clear OROPHARYNX:no exudate, no erythema and lips, buccal mucosa, and tongue normal  NECK: supple, thyroid normal size, non-tender, without nodularity LYMPH:  no palpable lymphadenopathy in the cervical, axillary or inguinal LUNGS: clear to auscultation and percussion with normal breathing effort HEART: regular rate & rhythm and no murmurs and no lower extremity edema ABDOMEN:abdomen soft, non-tender and normal bowel sounds MUSCULOSKELETAL:no cyanosis of digits and no clubbing  NEURO: alert & oriented x 3 with fluent speech, no focal motor/sensory deficits EXTREMITIES: No lower extremity edema BREAST: No palpable masses or nodules in either right or left breasts. No palpable axillary supraclavicular or infraclavicular adenopathy no breast tenderness or nipple discharge. (exam performed in the presence of a chaperone)  LABORATORY DATA:  I have reviewed the data as listed   Chemistry      Component Value Date/Time   NA 142 12/07/2013 1317   K 4.6 12/07/2013 1317   CL 107 10/28/2012 0925   CL 104 09/28/2012 1347   CO2 26 12/07/2013 1317   BUN 11.5 12/07/2013 1317   CREATININE 1.0 12/07/2013 1317      Component Value Date/Time   CALCIUM 9.2 12/07/2013 1317   ALKPHOS 41 12/07/2013 1317   AST 33 12/07/2013 1317   ALT 30 12/07/2013 1317   BILITOT 0.52 12/07/2013 1317       Lab Results  Component Value Date   WBC 7.9 12/07/2013   HGB 13.6 12/07/2013   HCT 40.7 12/07/2013   MCV 94.2 12/07/2013   PLT 209 12/07/2013   NEUTROABS 3.4 12/07/2013     ASSESSMENT & PLAN:  Breast cancer of lower-inner quadrant of right female breast Stage IA right breast cancer diagnosed in March 2014 treated with lumpectomy. She was given 2 cycles of Taxotere and Cytoxan but she could not tolerate it so it  was discontinued (due to high Oncotype DX recurrence score). Because she was ER/PR positive she was started on tamoxifen in September 2014 switched to anastrozole August 2015.   Aromatase inhibitor toxicities: 1. Hot flashes:could not tolerate Effexor XR, I recommended that she do yoga 2. Muscle aches and pains especially left side of the back  Breast cancer surveillance: 1. Breast exam 12/06/2015 is normal 2. Mammogram April 2017 no abnormalities were seen. Breast density category B  Return to clinic in 1 year for follow-up   No orders of the defined types were placed in this encounter.  The patient has a good understanding of the overall plan. she agrees with it. she will call with any problems that may develop before the next visit here.   Rulon Eisenmenger, MD 12/06/15

## 2016-04-28 DIAGNOSIS — M1712 Unilateral primary osteoarthritis, left knee: Secondary | ICD-10-CM | POA: Diagnosis not present

## 2016-05-21 DIAGNOSIS — Z6828 Body mass index (BMI) 28.0-28.9, adult: Secondary | ICD-10-CM | POA: Diagnosis not present

## 2016-05-21 DIAGNOSIS — R05 Cough: Secondary | ICD-10-CM | POA: Diagnosis not present

## 2016-05-21 DIAGNOSIS — J111 Influenza due to unidentified influenza virus with other respiratory manifestations: Secondary | ICD-10-CM | POA: Diagnosis not present

## 2016-05-28 DIAGNOSIS — R55 Syncope and collapse: Secondary | ICD-10-CM | POA: Diagnosis not present

## 2016-05-28 DIAGNOSIS — Z6828 Body mass index (BMI) 28.0-28.9, adult: Secondary | ICD-10-CM | POA: Diagnosis not present

## 2016-05-28 DIAGNOSIS — R05 Cough: Secondary | ICD-10-CM | POA: Diagnosis not present

## 2016-05-28 DIAGNOSIS — J111 Influenza due to unidentified influenza virus with other respiratory manifestations: Secondary | ICD-10-CM | POA: Diagnosis not present

## 2016-06-17 DIAGNOSIS — D1801 Hemangioma of skin and subcutaneous tissue: Secondary | ICD-10-CM | POA: Diagnosis not present

## 2016-06-17 DIAGNOSIS — L853 Xerosis cutis: Secondary | ICD-10-CM | POA: Diagnosis not present

## 2016-06-17 DIAGNOSIS — L570 Actinic keratosis: Secondary | ICD-10-CM | POA: Diagnosis not present

## 2016-06-17 DIAGNOSIS — L821 Other seborrheic keratosis: Secondary | ICD-10-CM | POA: Diagnosis not present

## 2016-06-17 DIAGNOSIS — L814 Other melanin hyperpigmentation: Secondary | ICD-10-CM | POA: Diagnosis not present

## 2016-07-09 DIAGNOSIS — R8299 Other abnormal findings in urine: Secondary | ICD-10-CM | POA: Diagnosis not present

## 2016-07-09 DIAGNOSIS — N390 Urinary tract infection, site not specified: Secondary | ICD-10-CM | POA: Diagnosis not present

## 2016-07-29 DIAGNOSIS — Z853 Personal history of malignant neoplasm of breast: Secondary | ICD-10-CM | POA: Diagnosis not present

## 2016-07-29 DIAGNOSIS — R922 Inconclusive mammogram: Secondary | ICD-10-CM | POA: Diagnosis not present

## 2016-08-06 NOTE — Progress Notes (Signed)
Received mammogram. Sent to scan

## 2016-08-07 DIAGNOSIS — M1712 Unilateral primary osteoarthritis, left knee: Secondary | ICD-10-CM | POA: Diagnosis not present

## 2016-08-13 DIAGNOSIS — M1712 Unilateral primary osteoarthritis, left knee: Secondary | ICD-10-CM | POA: Diagnosis not present

## 2016-08-20 DIAGNOSIS — M1712 Unilateral primary osteoarthritis, left knee: Secondary | ICD-10-CM | POA: Diagnosis not present

## 2016-10-02 DIAGNOSIS — M5412 Radiculopathy, cervical region: Secondary | ICD-10-CM | POA: Diagnosis not present

## 2016-12-04 NOTE — Assessment & Plan Note (Signed)
Stage IA right breast cancer diagnosed in March 2014 treated with lumpectomy. She was given 2 cycles of Taxotere and Cytoxan but she could not tolerate it so it was discontinued (due to high Oncotype DX recurrence score). Because she was ER/PR positive she was started on tamoxifen in September 2014 switched to anastrozole August 2015.   Aromatase inhibitor toxicities: 1. Hot flashes:could not tolerate Effexor XR, I recommended that she do yoga 2. Muscle aches and pains especially left side of the back  Breast cancer surveillance: 1. Breast exam 12/05/2016 is normal 2. Mammogram April 2018 no abnormalities were seen. Breast density category B  Return to clinic in 1 year for follow-up

## 2016-12-05 ENCOUNTER — Encounter: Payer: Self-pay | Admitting: Hematology and Oncology

## 2016-12-05 ENCOUNTER — Ambulatory Visit (HOSPITAL_BASED_OUTPATIENT_CLINIC_OR_DEPARTMENT_OTHER): Payer: Medicare Other | Admitting: Hematology and Oncology

## 2016-12-05 ENCOUNTER — Telehealth: Payer: Self-pay

## 2016-12-05 DIAGNOSIS — Z79810 Long term (current) use of selective estrogen receptor modulators (SERMs): Secondary | ICD-10-CM

## 2016-12-05 DIAGNOSIS — Z170 Estrogen receptor positive status [ER+]: Secondary | ICD-10-CM | POA: Diagnosis not present

## 2016-12-05 DIAGNOSIS — C50311 Malignant neoplasm of lower-inner quadrant of right female breast: Secondary | ICD-10-CM | POA: Diagnosis not present

## 2016-12-05 MED ORDER — ANASTROZOLE 1 MG PO TABS: 1.0000 mg | ORAL_TABLET | Freq: Every day | ORAL | 3 refills | Status: DC

## 2016-12-05 MED ORDER — ESOMEPRAZOLE MAGNESIUM 20 MG PO CPDR: 20.0000 mg | DELAYED_RELEASE_CAPSULE | Freq: Every day | ORAL | Status: DC

## 2016-12-05 NOTE — Telephone Encounter (Signed)
appts made and avs printed for patient 

## 2016-12-05 NOTE — Progress Notes (Signed)
Patient Care Team: Crist Infante, MD as PCP - General (Internal Medicine)  DIAGNOSIS:  Encounter Diagnosis  Name Primary?  . Malignant neoplasm of lower-inner quadrant of right breast of female, estrogen receptor positive ([name])     SUMMARY OF ONCOLOGIC HISTORY:   Breast cancer of lower-inner quadrant of right female breast ([name])   07/01/2012 Initial Diagnosis    Breast cancer of lower-inner quadrant of right female breast, BCT 07/2012, high oncotype, poorly tolerated chemo      07/23/2012 Surgery    Lumpectomy with SLN biopsy, 1.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 Stopped chemo.      10/26/2012 - 11/22/2012 Radiation Therapy    Rsdiation to lumpectomy site      12/14/2012 -  Anti-estrogen oral therapy    Tamoxifen 20 mg daily until August 2015 switched to Arimidex 1 mg daily (given along with Effexor)       CHIEF COMPLIANT: Follow-up on Arimidex therapy  INTERVAL HISTORY: Shelia Obrien is a 76 year old with above-mentioned history of right breast cancer who was treated with chemotherapy but she could not tolerate it. She then underwent radiation and is currently on antiestrogen therapy with Arimidex. She could not tolerate tamoxifen. She is tolerating Arimidex much better. She currently has hot flashes which are responding to Effexor. She also has muscle aches and pains intermittently. She complains of right arm numbness for a pinched nerve in the neck  REVIEW OF SYSTEMS:   Constitutional: Denies fevers, chills or abnormal weight loss Eyes: Denies blurriness of vision Ears, nose, mouth, throat, and face: Denies mucositis or sore throat Respiratory: Denies cough, dyspnea or wheezes Cardiovascular: Denies palpitation, chest discomfort Gastrointestinal:  Denies nausea, heartburn or change in bowel  habits Skin: Denies abnormal skin rashes Lymphatics: Denies new lymphadenopathy or easy bruising Neurological: Right arm numbness Behavioral/Psych: Mood is stable, no new changes  Extremities: No lower extremity edema Breast:  denies any pain or lumps or nodules in either breasts All other systems were reviewed with the patient and are negative.  I have reviewed the past medical history, past surgical history, social history and family history with the patient and they are unchanged from previous note.  ALLERGIES:  has No Known Allergies.  MEDICATIONS:  Current Outpatient Prescriptions  Medication Sig Dispense Refill  . anastrozole (ARIMIDEX) 1 MG tablet TAKE 1 TABLET EVERY DAY 90 tablet 3  . b complex vitamins tablet Take 1 tablet by mouth daily.    . cetirizine (ZYRTEC) 10 MG tablet Take 10 mg by mouth daily.    Marland Kitchen esomeprazole (NEXIUM) 20 MG capsule Take 1 capsule (20 mg total) by mouth daily at 12 noon.    Marland Kitchen levothyroxine (SYNTHROID, LEVOTHROID) 75 MCG tablet Take 75 mcg by mouth every morning.     Marland Kitchen losartan (COZAAR) 25 MG tablet     . rosuvastatin (CRESTOR) 10 MG tablet Take 10 mg by mouth daily.     . valACYclovir (VALTREX) 1000 MG tablet Take 1,000 mg by mouth 2 (two) times daily as needed (for cold sores).     . Vitamin D, Ergocalciferol, (DRISDOL) 50000 UNITS CAPS Take 5,000 Units by mouth every 14 (fourteen) days. Taken on Wednesdays.     No current facility-administered medications for this visit.     PHYSICAL EXAMINATION: ECOG  PERFORMANCE STATUS: 1 - Symptomatic but completely ambulatory  Vitals:   12/05/16 0810  BP: 124/63  Pulse: 65  Resp: 18  Temp: 98 F (36.7 C)  SpO2: 99%   Filed Weights   12/05/16 0810  Weight: 163 lb (73.9 kg)    GENERAL:alert, no distress and comfortable SKIN: skin color, texture, turgor are normal, no rashes or significant lesions EYES: normal, Conjunctiva are pink and non-injected, sclera clear OROPHARYNX:no exudate, no erythema  and lips, buccal mucosa, and tongue normal  NECK: supple, thyroid normal size, non-tender, without nodularity LYMPH:  no palpable lymphadenopathy in the cervical, axillary or inguinal LUNGS: clear to auscultation and percussion with normal breathing effort HEART: regular rate & rhythm and no murmurs and no lower extremity edema ABDOMEN:abdomen soft, non-tender and normal bowel sounds MUSCULOSKELETAL:no cyanosis of digits and no clubbing  NEURO: alert & oriented x 3 with fluent speech, no focal motor/sensory deficits EXTREMITIES: No lower extremity edema BREAST: No palpable masses or nodules in either right or left breasts. No palpable axillary supraclavicular or infraclavicular adenopathy no breast tenderness or nipple discharge. (exam performed in the presence of a chaperone)  LABORATORY DATA:  I have reviewed the data as listed   Chemistry      Component Value Date/Time   NA 142 12/07/2013 1317   K 4.6 12/07/2013 1317   CL 107 10/28/2012 0925   CL 104 09/28/2012 1347   CO2 26 12/07/2013 1317   BUN 11.5 12/07/2013 1317   CREATININE 1.0 12/07/2013 1317      Component Value Date/Time   CALCIUM 9.2 12/07/2013 1317   ALKPHOS 41 12/07/2013 1317   AST 33 12/07/2013 1317   ALT 30 12/07/2013 1317   BILITOT 0.52 12/07/2013 1317       Lab Results  Component Value Date   WBC 7.9 12/07/2013   HGB 13.6 12/07/2013   HCT 40.7 12/07/2013   MCV 94.2 12/07/2013   PLT 209 12/07/2013   NEUTROABS 3.4 12/07/2013    ASSESSMENT & PLAN:  Breast cancer of lower-inner quadrant of right female breast Stage IA right breast cancer diagnosed in March 2014 treated with lumpectomy. She was given 2 cycles of Taxotere and Cytoxan but she could not tolerate it so it was discontinued (due to high Oncotype DX recurrence score). Because she was ER/PR positive she was started on tamoxifen in September 2014 switched to anastrozole August 2015.   Aromatase inhibitor toxicities: 1. Hot flashes:could not  tolerate Effexor XR. Patient continues to have these hot flashes.  2. Muscle aches and pains especially left side of the back. She has a pinched nerve causes numbness on the right arm. She will discuss with Dr. Joylene Draft about getting physical therapy  Breast cancer surveillance: 1. Breast exam 12/05/2016 is normal 2. Mammogram April 2018 no abnormalities were seen. Breast density category B  Return to clinic in 1 year for follow-up   I spent 15 minutes talking to the patient of which more than half was spent in counseling and coordination of care.  No orders of the defined types were placed in this encounter.  The patient has a good understanding of the overall plan. she agrees with it. she will call with any problems that may develop before the next visit here.   Rulon Eisenmenger, MD 12/05/16

## 2016-12-26 DIAGNOSIS — E038 Other specified hypothyroidism: Secondary | ICD-10-CM | POA: Diagnosis not present

## 2016-12-26 DIAGNOSIS — M859 Disorder of bone density and structure, unspecified: Secondary | ICD-10-CM | POA: Diagnosis not present

## 2016-12-26 DIAGNOSIS — R7301 Impaired fasting glucose: Secondary | ICD-10-CM | POA: Diagnosis not present

## 2016-12-26 DIAGNOSIS — I10 Essential (primary) hypertension: Secondary | ICD-10-CM | POA: Diagnosis not present

## 2016-12-26 DIAGNOSIS — E784 Other hyperlipidemia: Secondary | ICD-10-CM | POA: Diagnosis not present

## 2016-12-26 DIAGNOSIS — N390 Urinary tract infection, site not specified: Secondary | ICD-10-CM | POA: Diagnosis not present

## 2016-12-31 ENCOUNTER — Encounter: Payer: Self-pay | Admitting: Podiatry

## 2016-12-31 ENCOUNTER — Ambulatory Visit (INDEPENDENT_AMBULATORY_CARE_PROVIDER_SITE_OTHER): Payer: Medicare Other | Admitting: Podiatry

## 2016-12-31 VITALS — BP 138/81 | HR 76 | Resp 16

## 2016-12-31 DIAGNOSIS — L600 Ingrowing nail: Secondary | ICD-10-CM

## 2016-12-31 NOTE — Patient Instructions (Signed)

## 2017-01-01 DIAGNOSIS — Z1212 Encounter for screening for malignant neoplasm of rectum: Secondary | ICD-10-CM | POA: Diagnosis not present

## 2017-01-02 DIAGNOSIS — R002 Palpitations: Secondary | ICD-10-CM | POA: Diagnosis not present

## 2017-01-02 DIAGNOSIS — Z1389 Encounter for screening for other disorder: Secondary | ICD-10-CM | POA: Diagnosis not present

## 2017-01-02 DIAGNOSIS — G4709 Other insomnia: Secondary | ICD-10-CM | POA: Diagnosis not present

## 2017-01-02 DIAGNOSIS — R300 Dysuria: Secondary | ICD-10-CM | POA: Diagnosis not present

## 2017-01-02 DIAGNOSIS — Z6829 Body mass index (BMI) 29.0-29.9, adult: Secondary | ICD-10-CM | POA: Diagnosis not present

## 2017-01-02 DIAGNOSIS — Z0000 Encounter for general adult medical examination without abnormal findings: Secondary | ICD-10-CM | POA: Diagnosis not present

## 2017-01-02 DIAGNOSIS — I872 Venous insufficiency (chronic) (peripheral): Secondary | ICD-10-CM | POA: Diagnosis not present

## 2017-01-02 DIAGNOSIS — C50919 Malignant neoplasm of unspecified site of unspecified female breast: Secondary | ICD-10-CM | POA: Diagnosis not present

## 2017-01-02 DIAGNOSIS — E559 Vitamin D deficiency, unspecified: Secondary | ICD-10-CM | POA: Diagnosis not present

## 2017-01-02 DIAGNOSIS — I10 Essential (primary) hypertension: Secondary | ICD-10-CM | POA: Diagnosis not present

## 2017-01-02 DIAGNOSIS — J3089 Other allergic rhinitis: Secondary | ICD-10-CM | POA: Diagnosis not present

## 2017-01-02 DIAGNOSIS — K5730 Diverticulosis of large intestine without perforation or abscess without bleeding: Secondary | ICD-10-CM | POA: Diagnosis not present

## 2017-01-04 NOTE — Progress Notes (Signed)
   Subjective: Patient presents today for evaluation of pain to the medial border of the left great toe that began three days ago. She reports associated redness and swelling of the toe. Patient is concerned for possible ingrown nail. Patient presents today for further treatment and evaluation.  Past Medical History:  Diagnosis Date  . Acid reflux disease   . Arthritis   . Breast cancer ([place]) 06/24/12   right, lower inner  . Bronchitis   . Epistaxis    went to [facility] ER  . High cholesterol   . History of radiation therapy 10/26/2012-11/22/2012   50 gray to the right breast  . Hypertension june 2014   no current bp meds  . Hypothyroidism   . Leaking of urine    Dribbles if coughs. Pt wears pad  . PONV (postoperative nausea and vomiting)   . Seasonal allergies   . Shortness of breath   . Swelling of left lower extremity    x 2 weeks, no sob  . Swelling of right lower extremity x 2 weeks   no sob  . Thyroid disease   . Use of tamoxifen (Nolvadex)   . Wears glasses     Objective:  General: Well developed, nourished, in no acute distress, alert and oriented x3   Dermatology: Skin is warm, dry and supple bilateral. Medial border of left great toe appears to be erythematous with evidence of an ingrowing nail. Pain on palpation noted to the border of the nail fold. The remaining nails appear unremarkable at this time. There are no open sores, lesions.  Vascular: Dorsalis Pedis artery and Posterior Tibial artery pedal pulses palpable. No lower extremity edema noted.   Neruologic: Grossly intact via light touch bilateral.  Musculoskeletal: Muscular strength within normal limits in all groups bilateral. Normal range of motion noted to all pedal and ankle joints.   Assesement: #1 Paronychia with ingrowing nail medial border left great toe #2 Pain in toe #3 Incurvated nail  Plan of Care:  1. Patient evaluated.  2. Discussed treatment alternatives and plan of care. Explained  nail avulsion procedure and post procedure course to patient. 3. Patient opted for permanent partial nail avulsion.  4. Prior to procedure, local anesthesia infiltration utilized using 3 ml of a 50:50 mixture of 2% plain lidocaine and 0.5% plain marcaine in a normal hallux block fashion and a betadine prep performed.  5. Partial permanent nail avulsion with chemical matrixectomy performed using [id number] applications of phenol followed by alcohol flush.  6. Light dressing applied. 7. Return to clinic in 2 weeks.   Edrick Kins, DPM [facility]  Dr. Edrick Kins, [address]                                        [place], [place] [address]                Office [phone]  Fax [phone]

## 2017-01-06 ENCOUNTER — Other Ambulatory Visit: Payer: Self-pay | Admitting: Internal Medicine

## 2017-01-06 DIAGNOSIS — M5030 Other cervical disc degeneration, unspecified cervical region: Secondary | ICD-10-CM

## 2017-01-14 ENCOUNTER — Ambulatory Visit: Payer: Medicare Other | Admitting: Podiatry

## 2017-01-14 ENCOUNTER — Ambulatory Visit
Admission: RE | Admit: 2017-01-14 | Discharge: 2017-01-14 | Disposition: A | Discharge status: Home / Self Care | Payer: Medicare Other | Source: Ambulatory Visit | Attending: Internal Medicine | Admitting: Internal Medicine

## 2017-01-14 DIAGNOSIS — M5030 Other cervical disc degeneration, unspecified cervical region: Secondary | ICD-10-CM

## 2017-01-14 DIAGNOSIS — M4802 Spinal stenosis, cervical region: Secondary | ICD-10-CM | POA: Diagnosis not present

## 2017-01-16 ENCOUNTER — Other Ambulatory Visit: Payer: Self-pay | Admitting: Internal Medicine

## 2017-01-16 DIAGNOSIS — M542 Cervicalgia: Secondary | ICD-10-CM

## 2017-01-30 ENCOUNTER — Ambulatory Visit
Admission: RE | Admit: 2017-01-30 | Discharge: 2017-01-30 | Disposition: A | Discharge status: Home / Self Care | Payer: Medicare Other | Source: Ambulatory Visit | Attending: Internal Medicine | Admitting: Internal Medicine

## 2017-01-30 DIAGNOSIS — M542 Cervicalgia: Secondary | ICD-10-CM | POA: Diagnosis not present

## 2017-01-30 MED ORDER — TRIAMCINOLONE ACETONIDE 40 MG/ML IJ SUSP (RADIOLOGY)
60.0000 mg | Freq: Once | INTRAMUSCULAR | Status: AC
  Administered 2017-01-30: 60 mg via EPIDURAL

## 2017-01-30 MED ORDER — IOPAMIDOL (ISOVUE-M 300) INJECTION 61%
1.0000 mL | Freq: Once | INTRAMUSCULAR | Status: AC | PRN
  Administered 2017-01-30: 1 mL via EPIDURAL

## 2017-01-30 NOTE — Discharge Instructions (Signed)
Lumbar Puncture Discharge Instructions  1. Go home and rest quietly for the next 24 hours.  It is important to lie flat for the next 24 hours.  Get up only to go to the restroom.  You may lie in the bed or on a couch on your back, your stomach, your left side or your right side.  You may have one pillow under your head.  You may have pillows between your knees while you are on your side or under your knees while you are on your back.  2. DO NOT drive today.  Recline the seat as far back as it will go, while still wearing your seat belt, on the way home.  3. You may get up to go to the bathroom as needed.  You may sit up for 10 minutes to eat.  You may resume your normal diet and medications unless otherwise indicated.  Drink plenty of extra fluids today and tomorrow.  4. The incidence of a spinal headache with nausea and/or vomiting is about 5% (one in 20 patients).  If you develop a headache, lie flat and drink plenty of fluids until the headache goes away.  Caffeinated beverages may be helpful.  If you develop severe nausea and vomiting or a headache that does not go away with flat bed rest, please call the physician who sent you here.  5. You may resume normal activities after your 24 hours of bed rest is over; however, do not exert yourself strongly or do any heavy lifting tomorrow.  Please call us at [phone] with any questions or concerns.  6. Call your physician for a follow-up appointment.   Dr. Rolla Flatten is working all weekend, so if you have any questions or problems or develop a severe positional headache, please call the number highlighted above and ask to speak to him.

## 2017-03-24 DIAGNOSIS — M542 Cervicalgia: Secondary | ICD-10-CM | POA: Diagnosis not present

## 2017-03-27 DIAGNOSIS — M5412 Radiculopathy, cervical region: Secondary | ICD-10-CM | POA: Diagnosis not present

## 2017-04-02 DIAGNOSIS — I10 Essential (primary) hypertension: Secondary | ICD-10-CM | POA: Diagnosis not present

## 2017-04-02 DIAGNOSIS — J9801 Acute bronchospasm: Secondary | ICD-10-CM | POA: Diagnosis not present

## 2017-04-02 DIAGNOSIS — R05 Cough: Secondary | ICD-10-CM | POA: Diagnosis not present

## 2017-04-02 DIAGNOSIS — Z6828 Body mass index (BMI) 28.0-28.9, adult: Secondary | ICD-10-CM | POA: Diagnosis not present

## 2017-04-02 DIAGNOSIS — J069 Acute upper respiratory infection, unspecified: Secondary | ICD-10-CM | POA: Diagnosis not present

## 2017-04-22 DIAGNOSIS — H25013 Cortical age-related cataract, bilateral: Secondary | ICD-10-CM | POA: Diagnosis not present

## 2017-05-01 DIAGNOSIS — J069 Acute upper respiratory infection, unspecified: Secondary | ICD-10-CM | POA: Diagnosis not present

## 2017-05-01 DIAGNOSIS — R05 Cough: Secondary | ICD-10-CM | POA: Diagnosis not present

## 2017-05-01 DIAGNOSIS — J309 Allergic rhinitis, unspecified: Secondary | ICD-10-CM | POA: Diagnosis not present

## 2017-05-01 DIAGNOSIS — Z6829 Body mass index (BMI) 29.0-29.9, adult: Secondary | ICD-10-CM | POA: Diagnosis not present

## 2017-05-25 DIAGNOSIS — R0789 Other chest pain: Secondary | ICD-10-CM | POA: Diagnosis not present

## 2017-05-25 DIAGNOSIS — M4302 Spondylolysis, cervical region: Secondary | ICD-10-CM | POA: Diagnosis not present

## 2017-05-25 DIAGNOSIS — Z6828 Body mass index (BMI) 28.0-28.9, adult: Secondary | ICD-10-CM | POA: Diagnosis not present

## 2017-05-25 DIAGNOSIS — R079 Chest pain, unspecified: Secondary | ICD-10-CM | POA: Diagnosis not present

## 2017-07-30 ENCOUNTER — Encounter: Payer: Self-pay | Admitting: Hematology and Oncology

## 2017-07-30 DIAGNOSIS — Z853 Personal history of malignant neoplasm of breast: Secondary | ICD-10-CM | POA: Diagnosis not present

## 2017-07-30 DIAGNOSIS — R928 Other abnormal and inconclusive findings on diagnostic imaging of breast: Secondary | ICD-10-CM | POA: Diagnosis not present

## 2017-08-04 DIAGNOSIS — R69 Illness, unspecified: Secondary | ICD-10-CM | POA: Diagnosis not present

## 2017-09-04 DIAGNOSIS — M5412 Radiculopathy, cervical region: Secondary | ICD-10-CM | POA: Diagnosis not present

## 2017-09-04 DIAGNOSIS — M50322 Other cervical disc degeneration at C5-C6 level: Secondary | ICD-10-CM | POA: Diagnosis not present

## 2017-09-14 DIAGNOSIS — M1712 Unilateral primary osteoarthritis, left knee: Secondary | ICD-10-CM | POA: Diagnosis not present

## 2017-12-04 ENCOUNTER — Telehealth: Payer: Self-pay | Admitting: Hematology and Oncology

## 2017-12-04 ENCOUNTER — Inpatient Hospital Stay: Payer: Medicare HMO | Attending: Hematology and Oncology | Admitting: Hematology and Oncology

## 2017-12-04 DIAGNOSIS — C50311 Malignant neoplasm of lower-inner quadrant of right female breast: Secondary | ICD-10-CM | POA: Diagnosis not present

## 2017-12-04 DIAGNOSIS — Z79811 Long term (current) use of aromatase inhibitors: Secondary | ICD-10-CM | POA: Diagnosis not present

## 2017-12-04 DIAGNOSIS — Z170 Estrogen receptor positive status [ER+]: Secondary | ICD-10-CM | POA: Diagnosis not present

## 2017-12-04 DIAGNOSIS — N951 Menopausal and female climacteric states: Secondary | ICD-10-CM | POA: Diagnosis not present

## 2017-12-04 MED ORDER — OMEPRAZOLE 20 MG PO CPDR: 20.0000 mg | DELAYED_RELEASE_CAPSULE | Freq: Every day | ORAL | Status: DC

## 2017-12-04 MED ORDER — ANASTROZOLE 1 MG PO TABS: 1.0000 mg | ORAL_TABLET | Freq: Every day | ORAL | 3 refills | Status: DC

## 2017-12-04 NOTE — Assessment & Plan Note (Signed)
Stage IA right breast cancer diagnosed in March 2014 treated with lumpectomy. She was given 2 cycles of Taxotere and Cytoxan but she could not tolerate it so it was discontinued (due to high Oncotype DX recurrence score). Because she was ER/PR positive she was started on tamoxifen in September 2014 switched to anastrozole August 2015.   Aromatase inhibitor toxicities: 1. Hot flashes:could not tolerate Effexor XR. Patient continues to have these hot flashes.  2. Muscle aches and pains especially left side of the back. She has a pinched nerve causes numbness on the right arm. She will discuss with Dr. Joylene Draft about getting physical therapy  Breast cancer surveillance: 1. Breast exam 8/23/2019is normal 2. Mammogram April 2019 at [facility] abnormalities were seen. Breast density category B  Return to clinic in 1 year for follow-up

## 2017-12-04 NOTE — Progress Notes (Signed)
Patient Care Team: Crist Infante, MD as PCP - General (Internal Medicine)  DIAGNOSIS:  Encounter Diagnosis  Name Primary?  . Malignant neoplasm of lower-inner quadrant of right breast of female, estrogen receptor positive ([place])     SUMMARY OF ONCOLOGIC HISTORY:   Breast cancer of lower-inner quadrant of right female breast ([place])   07/01/2012 Initial Diagnosis    Breast cancer of lower-inner quadrant of right female breast, BCT 07/2012, high oncotype, poorly tolerated chemo    07/23/2012 Surgery    Lumpectomy with SLN biopsy, 1.7 cm Inv lobular ca with LCIS, Perineural inv, Er:93%; PR 7%; Her2 Neg (ratio 1.04), Ki 67: 12%;T1cN0M0 (stage IA)    08/27/2012 - 09/17/2012 Chemotherapy    2 cycles of Taxotere, Cytoxan Could not tolerate it. Stopped chemo.    10/26/2012 - 11/22/2012 Radiation Therapy    Rsdiation to lumpectomy site    12/14/2012 -  Anti-estrogen oral therapy    Tamoxifen 20 mg daily until August 2015 switched to Arimidex 1 mg daily (given along with Effexor)     CHIEF COMPLIANT: Follow-up on anastrozole therapy  INTERVAL HISTORY: Shelia Obrien is a 77 year old with above-mentioned history of right breast cancer treated with lumpectomy 2 cycles of chemo radiation and has been on oral antiestrogen therapy since 2014.  She completed 5 years of therapy.  She does have occasional muscle aches and pains and a pinched nerve in the back which is causing her intermittent problems.  She had an injection to the spine.  She however is tolerating the treatment reasonably well.  She is willing to take it for 2 more years.  REVIEW OF SYSTEMS:   Constitutional: Denies fevers, chills or abnormal weight loss Eyes: Denies blurriness of vision Ears, nose, mouth, throat, and face: Denies mucositis or sore throat Respiratory: Denies cough, dyspnea or wheezes Cardiovascular: Denies palpitation, chest discomfort Gastrointestinal:  Denies nausea, heartburn or change in bowel habits Skin: Denies  abnormal skin rashes Lymphatics: Denies new lymphadenopathy or easy bruising Neurological:Denies numbness, tingling or new weaknesses Behavioral/Psych: Mood is stable, no new changes  Extremities: Neck and lower back pain Breast:  denies any pain or lumps or nodules in either breasts All other systems were reviewed with the patient and are negative.  I have reviewed the past medical history, past surgical history, social history and family history with the patient and they are unchanged from previous note.  ALLERGIES:  has No Known Allergies.  MEDICATIONS:  Current Outpatient Medications  Medication Sig Dispense Refill  . anastrozole (ARIMIDEX) 1 MG tablet Take 1 tablet (1 mg total) by mouth daily. 90 tablet 3  . b complex vitamins tablet Take 1 tablet by mouth daily.    . cetirizine (ZYRTEC) 10 MG tablet Take 10 mg by mouth daily.    Marland Kitchen levothyroxine (SYNTHROID, LEVOTHROID) 75 MCG tablet Take 75 mcg by mouth every morning.     Marland Kitchen losartan (COZAAR) 25 MG tablet     . omeprazole (PRILOSEC) 20 MG capsule Take 1 capsule (20 mg total) by mouth daily.    . rosuvastatin (CRESTOR) 10 MG tablet Take 10 mg by mouth daily.     . valACYclovir (VALTREX) 1000 MG tablet Take 1,000 mg by mouth 2 (two) times daily as needed (for cold sores).     . Vitamin D, Ergocalciferol, (DRISDOL) 50000 UNITS CAPS Take 5,000 Units by mouth every 14 (fourteen) days. Taken on Wednesdays.     No current facility-administered medications for this visit.  PHYSICAL EXAMINATION: ECOG PERFORMANCE STATUS: 1 - Symptomatic but completely ambulatory  Vitals:   12/04/17 0948  BP: 139/63  Pulse: 76  Resp: 16  Temp: 98 F (36.7 C)  SpO2: 98%   Filed Weights   12/04/17 0948  Weight: 156 lb 4.8 oz (70.9 kg)    GENERAL:alert, no distress and comfortable SKIN: skin color, texture, turgor are normal, no rashes or significant lesions EYES: normal, Conjunctiva are pink and non-injected, sclera clear OROPHARYNX:no  exudate, no erythema and lips, buccal mucosa, and tongue normal  NECK: supple, thyroid normal size, non-tender, without nodularity LYMPH:  no palpable lymphadenopathy in the cervical, axillary or inguinal LUNGS: clear to auscultation and percussion with normal breathing effort HEART: regular rate & rhythm and no murmurs and no lower extremity edema ABDOMEN:abdomen soft, non-tender and normal bowel sounds MUSCULOSKELETAL:no cyanosis of digits and no clubbing  NEURO: alert & oriented x 3 with fluent speech, no focal motor/sensory deficits EXTREMITIES: No lower extremity edema BREAST: No palpable masses or nodules in either right or left breasts. No palpable axillary supraclavicular or infraclavicular adenopathy no breast tenderness or nipple discharge. (exam performed in the presence of a chaperone)  LABORATORY DATA:  I have reviewed the data as listed CMP Latest Ref Rng & Units 12/07/2013 03/23/2013 10/28/2012  Glucose 70 - 140 mg/dl 113 87 101(H)  BUN 7.0 - 26.0 mg/dL 11.5 14.8 10  Creatinine 0.6 - 1.1 mg/dL 1.0 1.0 0.81  Sodium 136 - 145 mEq/L 142 143 141  Potassium 3.5 - 5.1 mEq/L 4.6 4.5 4.9  Chloride 96 - 112 mEq/L - - 107  CO2 22 - 29 mEq/L _0 Calcium 8.4 - 10.4 mg/dL 9.2 9.7 9.7  Total Protein 6.4 - 8.3 g/dL 6.6 6.8 -  Total Bilirubin 0.20 - 1.20 mg/dL 0.52 0.73 -  Alkaline Phos 40 - 150 U/L 41 47 -  AST 5 - 34 U/L 33 31 -  ALT 0 - 55 U/L 30 25 -    Lab Results  Component Value Date   WBC 7.9 12/07/2013   HGB 13.6 12/07/2013   HCT 40.7 12/07/2013   MCV 94.2 12/07/2013   PLT 209 12/07/2013   NEUTROABS 3.4 12/07/2013    ASSESSMENT & PLAN:  Breast cancer of lower-inner quadrant of right female breast Stage IA right breast cancer diagnosed in March 2014 treated with lumpectomy. She was given 2 cycles of Taxotere and Cytoxan but she could not tolerate it so it was discontinued (due to high Oncotype DX recurrence score). Because she was ER/PR positive she was started on  tamoxifen in September 2014 switched to anastrozole August 2015.   Aromatase inhibitor toxicities: 1. Hot flashes:could not tolerate Effexor XR. Patient continues to have these hot flashes.  2. Muscle aches and pains especially left side of the back. She has a pinched nerve causes numbness on the right arm. She will discuss with Dr. Joylene Draft about getting physical therapy  Breast cancer surveillance: 1. Breast exam 8/23/2019is normal 2. Mammogram April 2019 at [facility] abnormalities were seen. Breast density category B  Return to clinic in 1 year for follow-up    No orders of the defined types were placed in this encounter.  The patient has a good understanding of the overall plan. she agrees with it. she will call with any problems that may develop before the next visit here.   Harriette Ohara, MD 12/04/17

## 2017-12-04 NOTE — Telephone Encounter (Signed)
Gave PT avs and calendar of upcoming appts.

## 2017-12-19 DIAGNOSIS — Z23 Encounter for immunization: Secondary | ICD-10-CM | POA: Diagnosis not present

## 2018-01-28 DIAGNOSIS — R7301 Impaired fasting glucose: Secondary | ICD-10-CM | POA: Diagnosis not present

## 2018-01-28 DIAGNOSIS — E559 Vitamin D deficiency, unspecified: Secondary | ICD-10-CM | POA: Diagnosis not present

## 2018-01-28 DIAGNOSIS — I10 Essential (primary) hypertension: Secondary | ICD-10-CM | POA: Diagnosis not present

## 2018-01-28 DIAGNOSIS — E7849 Other hyperlipidemia: Secondary | ICD-10-CM | POA: Diagnosis not present

## 2018-01-28 DIAGNOSIS — M859 Disorder of bone density and structure, unspecified: Secondary | ICD-10-CM | POA: Diagnosis not present

## 2018-01-28 DIAGNOSIS — E038 Other specified hypothyroidism: Secondary | ICD-10-CM | POA: Diagnosis not present

## 2018-01-28 DIAGNOSIS — R82998 Other abnormal findings in urine: Secondary | ICD-10-CM | POA: Diagnosis not present

## 2018-02-05 DIAGNOSIS — Z0000 Encounter for general adult medical examination without abnormal findings: Secondary | ICD-10-CM | POA: Diagnosis not present

## 2018-02-05 DIAGNOSIS — Z1212 Encounter for screening for malignant neoplasm of rectum: Secondary | ICD-10-CM | POA: Diagnosis not present

## 2018-02-05 DIAGNOSIS — M542 Cervicalgia: Secondary | ICD-10-CM | POA: Diagnosis not present

## 2018-02-05 DIAGNOSIS — C50919 Malignant neoplasm of unspecified site of unspecified female breast: Secondary | ICD-10-CM | POA: Diagnosis not present

## 2018-02-05 DIAGNOSIS — M4302 Spondylolysis, cervical region: Secondary | ICD-10-CM | POA: Diagnosis not present

## 2018-02-05 DIAGNOSIS — E038 Other specified hypothyroidism: Secondary | ICD-10-CM | POA: Diagnosis not present

## 2018-02-05 DIAGNOSIS — Z1231 Encounter for screening mammogram for malignant neoplasm of breast: Secondary | ICD-10-CM | POA: Diagnosis not present

## 2018-02-05 DIAGNOSIS — G4709 Other insomnia: Secondary | ICD-10-CM | POA: Diagnosis not present

## 2018-02-05 DIAGNOSIS — I10 Essential (primary) hypertension: Secondary | ICD-10-CM | POA: Diagnosis not present

## 2018-02-05 DIAGNOSIS — I498 Other specified cardiac arrhythmias: Secondary | ICD-10-CM | POA: Diagnosis not present

## 2018-02-05 DIAGNOSIS — K644 Residual hemorrhoidal skin tags: Secondary | ICD-10-CM | POA: Diagnosis not present

## 2018-02-09 DIAGNOSIS — R69 Illness, unspecified: Secondary | ICD-10-CM | POA: Diagnosis not present

## 2018-02-26 DIAGNOSIS — Z6828 Body mass index (BMI) 28.0-28.9, adult: Secondary | ICD-10-CM | POA: Diagnosis not present

## 2018-02-26 DIAGNOSIS — I10 Essential (primary) hypertension: Secondary | ICD-10-CM | POA: Diagnosis not present

## 2018-02-26 DIAGNOSIS — G3184 Mild cognitive impairment, so stated: Secondary | ICD-10-CM | POA: Diagnosis not present

## 2018-02-26 DIAGNOSIS — E7849 Other hyperlipidemia: Secondary | ICD-10-CM | POA: Diagnosis not present

## 2018-03-03 DIAGNOSIS — C50911 Malignant neoplasm of unspecified site of right female breast: Secondary | ICD-10-CM | POA: Diagnosis not present

## 2018-03-03 DIAGNOSIS — I10 Essential (primary) hypertension: Secondary | ICD-10-CM | POA: Diagnosis not present

## 2018-03-03 DIAGNOSIS — M792 Neuralgia and neuritis, unspecified: Secondary | ICD-10-CM | POA: Diagnosis not present

## 2018-03-03 DIAGNOSIS — K219 Gastro-esophageal reflux disease without esophagitis: Secondary | ICD-10-CM | POA: Diagnosis not present

## 2018-03-03 DIAGNOSIS — E039 Hypothyroidism, unspecified: Secondary | ICD-10-CM | POA: Diagnosis not present

## 2018-03-03 DIAGNOSIS — J309 Allergic rhinitis, unspecified: Secondary | ICD-10-CM | POA: Diagnosis not present

## 2018-03-03 DIAGNOSIS — M1990 Unspecified osteoarthritis, unspecified site: Secondary | ICD-10-CM | POA: Diagnosis not present

## 2018-03-03 DIAGNOSIS — G8929 Other chronic pain: Secondary | ICD-10-CM | POA: Diagnosis not present

## 2018-03-03 DIAGNOSIS — R69 Illness, unspecified: Secondary | ICD-10-CM | POA: Diagnosis not present

## 2018-03-03 DIAGNOSIS — E785 Hyperlipidemia, unspecified: Secondary | ICD-10-CM | POA: Diagnosis not present

## 2018-03-29 DIAGNOSIS — C44519 Basal cell carcinoma of skin of other part of trunk: Secondary | ICD-10-CM | POA: Diagnosis not present

## 2018-03-29 DIAGNOSIS — D225 Melanocytic nevi of trunk: Secondary | ICD-10-CM | POA: Diagnosis not present

## 2018-03-29 DIAGNOSIS — D1801 Hemangioma of skin and subcutaneous tissue: Secondary | ICD-10-CM | POA: Diagnosis not present

## 2018-03-29 DIAGNOSIS — L821 Other seborrheic keratosis: Secondary | ICD-10-CM | POA: Diagnosis not present

## 2018-03-29 DIAGNOSIS — D485 Neoplasm of uncertain behavior of skin: Secondary | ICD-10-CM | POA: Diagnosis not present

## 2018-04-05 DIAGNOSIS — D225 Melanocytic nevi of trunk: Secondary | ICD-10-CM | POA: Diagnosis not present

## 2018-04-05 DIAGNOSIS — C44519 Basal cell carcinoma of skin of other part of trunk: Secondary | ICD-10-CM | POA: Diagnosis not present

## 2018-04-23 DIAGNOSIS — H25013 Cortical age-related cataract, bilateral: Secondary | ICD-10-CM | POA: Diagnosis not present

## 2018-04-23 DIAGNOSIS — H5203 Hypermetropia, bilateral: Secondary | ICD-10-CM | POA: Diagnosis not present

## 2018-04-23 DIAGNOSIS — H52203 Unspecified astigmatism, bilateral: Secondary | ICD-10-CM | POA: Diagnosis not present

## 2018-04-30 DIAGNOSIS — Z6827 Body mass index (BMI) 27.0-27.9, adult: Secondary | ICD-10-CM | POA: Diagnosis not present

## 2018-04-30 DIAGNOSIS — G3184 Mild cognitive impairment, so stated: Secondary | ICD-10-CM | POA: Diagnosis not present

## 2018-04-30 DIAGNOSIS — H25013 Cortical age-related cataract, bilateral: Secondary | ICD-10-CM | POA: Diagnosis not present

## 2018-04-30 DIAGNOSIS — K219 Gastro-esophageal reflux disease without esophagitis: Secondary | ICD-10-CM | POA: Diagnosis not present

## 2018-04-30 DIAGNOSIS — C44519 Basal cell carcinoma of skin of other part of trunk: Secondary | ICD-10-CM | POA: Diagnosis not present

## 2018-04-30 DIAGNOSIS — I10 Essential (primary) hypertension: Secondary | ICD-10-CM | POA: Diagnosis not present

## 2018-04-30 DIAGNOSIS — J309 Allergic rhinitis, unspecified: Secondary | ICD-10-CM | POA: Diagnosis not present

## 2018-06-15 DIAGNOSIS — H2512 Age-related nuclear cataract, left eye: Secondary | ICD-10-CM | POA: Diagnosis not present

## 2018-06-15 DIAGNOSIS — H25812 Combined forms of age-related cataract, left eye: Secondary | ICD-10-CM | POA: Diagnosis not present

## 2018-06-15 DIAGNOSIS — H25012 Cortical age-related cataract, left eye: Secondary | ICD-10-CM | POA: Diagnosis not present

## 2018-09-07 DIAGNOSIS — H25011 Cortical age-related cataract, right eye: Secondary | ICD-10-CM | POA: Diagnosis not present

## 2018-09-07 DIAGNOSIS — H25811 Combined forms of age-related cataract, right eye: Secondary | ICD-10-CM | POA: Diagnosis not present

## 2018-09-07 DIAGNOSIS — H2511 Age-related nuclear cataract, right eye: Secondary | ICD-10-CM | POA: Diagnosis not present

## 2018-09-20 ENCOUNTER — Other Ambulatory Visit: Payer: Self-pay | Admitting: Hematology and Oncology

## 2018-10-19 ENCOUNTER — Encounter: Payer: Self-pay | Admitting: Hematology and Oncology

## 2018-10-19 DIAGNOSIS — Z1231 Encounter for screening mammogram for malignant neoplasm of breast: Secondary | ICD-10-CM | POA: Diagnosis not present

## 2018-10-19 DIAGNOSIS — Z853 Personal history of malignant neoplasm of breast: Secondary | ICD-10-CM | POA: Diagnosis not present

## 2018-10-26 DIAGNOSIS — Z961 Presence of intraocular lens: Secondary | ICD-10-CM | POA: Diagnosis not present

## 2018-10-28 DIAGNOSIS — L570 Actinic keratosis: Secondary | ICD-10-CM | POA: Diagnosis not present

## 2018-10-28 DIAGNOSIS — L814 Other melanin hyperpigmentation: Secondary | ICD-10-CM | POA: Diagnosis not present

## 2018-10-28 DIAGNOSIS — L308 Other specified dermatitis: Secondary | ICD-10-CM | POA: Diagnosis not present

## 2018-10-28 DIAGNOSIS — D1801 Hemangioma of skin and subcutaneous tissue: Secondary | ICD-10-CM | POA: Diagnosis not present

## 2018-10-28 DIAGNOSIS — L821 Other seborrheic keratosis: Secondary | ICD-10-CM | POA: Diagnosis not present

## 2018-10-28 DIAGNOSIS — L819 Disorder of pigmentation, unspecified: Secondary | ICD-10-CM | POA: Diagnosis not present

## 2018-10-28 DIAGNOSIS — D229 Melanocytic nevi, unspecified: Secondary | ICD-10-CM | POA: Diagnosis not present

## 2018-10-28 DIAGNOSIS — L718 Other rosacea: Secondary | ICD-10-CM | POA: Diagnosis not present

## 2018-10-28 DIAGNOSIS — I8393 Asymptomatic varicose veins of bilateral lower extremities: Secondary | ICD-10-CM | POA: Diagnosis not present

## 2018-10-28 DIAGNOSIS — Z85828 Personal history of other malignant neoplasm of skin: Secondary | ICD-10-CM | POA: Diagnosis not present

## 2018-11-05 DIAGNOSIS — H35351 Cystoid macular degeneration, right eye: Secondary | ICD-10-CM | POA: Diagnosis not present

## 2018-11-26 ENCOUNTER — Telehealth: Payer: Self-pay | Admitting: Hematology and Oncology

## 2018-11-26 NOTE — Telephone Encounter (Signed)
Patient requested to cancel appointment on 8/21

## 2018-11-26 NOTE — Assessment & Plan Note (Deleted)
Stage IA right breast cancer diagnosed in March 2014 treated with lumpectomy. She was given 2 cycles of Taxotere and Cytoxan but she could not tolerate it so it was discontinued (due to high Oncotype DX recurrence score). Because she was ER/PR positive she was started on tamoxifen in September 2014 switched to anastrozole August 2015.   Aromatase inhibitor toxicities: 1. Hot flashes:could not tolerate Effexor XR.Patient continues to have these hot flashes. 2. Muscle aches and pains especially left side of the back.She has a pinched nerve causes numbness on the right arm. She will discuss with Dr. Joylene Draft about getting physical therapy  Breast cancer surveillance: 1. Breast exam 8/23/2019is normal 2. Mammogram at [facility] abnormalities were seen. Breast density category B  Return to clinic in 1 year for follow-up

## 2018-11-27 ENCOUNTER — Other Ambulatory Visit: Payer: Self-pay | Admitting: Hematology and Oncology

## 2018-11-29 ENCOUNTER — Telehealth: Payer: Self-pay | Admitting: Hematology and Oncology

## 2018-11-29 DIAGNOSIS — R69 Illness, unspecified: Secondary | ICD-10-CM | POA: Diagnosis not present

## 2018-11-29 NOTE — Telephone Encounter (Signed)
Called pt per 8/15 sch message to set up follow up appt - per patient does not want to set up appt at the moment. Message sent to RN

## 2018-12-03 ENCOUNTER — Ambulatory Visit: Payer: Medicare HMO | Admitting: Hematology and Oncology

## 2018-12-08 DIAGNOSIS — H35351 Cystoid macular degeneration, right eye: Secondary | ICD-10-CM | POA: Diagnosis not present

## 2018-12-15 DIAGNOSIS — R69 Illness, unspecified: Secondary | ICD-10-CM | POA: Diagnosis not present

## 2018-12-17 DIAGNOSIS — Z23 Encounter for immunization: Secondary | ICD-10-CM | POA: Diagnosis not present

## 2019-01-31 DIAGNOSIS — R69 Illness, unspecified: Secondary | ICD-10-CM | POA: Diagnosis not present

## 2019-03-11 DIAGNOSIS — E038 Other specified hypothyroidism: Secondary | ICD-10-CM | POA: Diagnosis not present

## 2019-03-11 DIAGNOSIS — E785 Hyperlipidemia, unspecified: Secondary | ICD-10-CM | POA: Diagnosis not present

## 2019-03-11 DIAGNOSIS — E559 Vitamin D deficiency, unspecified: Secondary | ICD-10-CM | POA: Diagnosis not present

## 2019-03-11 DIAGNOSIS — R7301 Impaired fasting glucose: Secondary | ICD-10-CM | POA: Diagnosis not present

## 2019-03-11 DIAGNOSIS — M8580 Other specified disorders of bone density and structure, unspecified site: Secondary | ICD-10-CM | POA: Diagnosis not present

## 2019-03-11 DIAGNOSIS — E039 Hypothyroidism, unspecified: Secondary | ICD-10-CM | POA: Diagnosis not present

## 2019-03-11 DIAGNOSIS — Z0000 Encounter for general adult medical examination without abnormal findings: Secondary | ICD-10-CM | POA: Diagnosis not present

## 2019-03-11 DIAGNOSIS — I10 Essential (primary) hypertension: Secondary | ICD-10-CM | POA: Diagnosis not present

## 2019-03-14 DIAGNOSIS — R82998 Other abnormal findings in urine: Secondary | ICD-10-CM | POA: Diagnosis not present

## 2019-03-14 DIAGNOSIS — I10 Essential (primary) hypertension: Secondary | ICD-10-CM | POA: Diagnosis not present

## 2019-03-18 DIAGNOSIS — I499 Cardiac arrhythmia, unspecified: Secondary | ICD-10-CM | POA: Diagnosis not present

## 2019-03-18 DIAGNOSIS — R05 Cough: Secondary | ICD-10-CM | POA: Diagnosis not present

## 2019-03-18 DIAGNOSIS — Z0000 Encounter for general adult medical examination without abnormal findings: Secondary | ICD-10-CM | POA: Diagnosis not present

## 2019-03-18 DIAGNOSIS — M4302 Spondylolysis, cervical region: Secondary | ICD-10-CM | POA: Diagnosis not present

## 2019-03-18 DIAGNOSIS — C44519 Basal cell carcinoma of skin of other part of trunk: Secondary | ICD-10-CM | POA: Diagnosis not present

## 2019-03-18 DIAGNOSIS — G3184 Mild cognitive impairment, so stated: Secondary | ICD-10-CM | POA: Diagnosis not present

## 2019-03-18 DIAGNOSIS — R079 Chest pain, unspecified: Secondary | ICD-10-CM | POA: Diagnosis not present

## 2019-03-18 DIAGNOSIS — J069 Acute upper respiratory infection, unspecified: Secondary | ICD-10-CM | POA: Diagnosis not present

## 2019-03-18 DIAGNOSIS — R413 Other amnesia: Secondary | ICD-10-CM | POA: Diagnosis not present

## 2019-03-18 DIAGNOSIS — R55 Syncope and collapse: Secondary | ICD-10-CM | POA: Diagnosis not present

## 2019-03-22 DIAGNOSIS — J320 Chronic maxillary sinusitis: Secondary | ICD-10-CM | POA: Diagnosis not present

## 2019-03-29 DIAGNOSIS — Z1212 Encounter for screening for malignant neoplasm of rectum: Secondary | ICD-10-CM | POA: Diagnosis not present

## 2019-04-20 DIAGNOSIS — J320 Chronic maxillary sinusitis: Secondary | ICD-10-CM | POA: Diagnosis not present

## 2019-04-20 DIAGNOSIS — R69 Illness, unspecified: Secondary | ICD-10-CM | POA: Diagnosis not present

## 2019-05-30 DIAGNOSIS — L905 Scar conditions and fibrosis of skin: Secondary | ICD-10-CM | POA: Diagnosis not present

## 2019-05-30 DIAGNOSIS — L819 Disorder of pigmentation, unspecified: Secondary | ICD-10-CM | POA: Diagnosis not present

## 2019-05-30 DIAGNOSIS — Z85828 Personal history of other malignant neoplasm of skin: Secondary | ICD-10-CM | POA: Diagnosis not present

## 2019-07-20 ENCOUNTER — Telehealth: Payer: Self-pay | Admitting: Hematology and Oncology

## 2019-07-20 NOTE — Telephone Encounter (Signed)
Scheduled appt per 4/7 sch message - pt aware

## 2019-07-26 NOTE — Progress Notes (Signed)
Patient Care Team: Crist Infante, MD as PCP - General (Internal Medicine)  DIAGNOSIS:    ICD-10-CM   1. Malignant neoplasm of lower-inner quadrant of right breast of female, estrogen receptor positive ([place])  C50.311    Z17.0     SUMMARY OF ONCOLOGIC HISTORY: Oncology History  Breast cancer of lower-inner quadrant of right female breast ([place])  07/01/2012 Initial Diagnosis   Breast cancer of lower-inner quadrant of right female breast, BCT 07/2012, high oncotype, poorly tolerated chemo   07/23/2012 Surgery   Lumpectomy with SLN biopsy, 1.7 cm Inv lobular ca with LCIS, Perineural inv, Er:93%; PR 7%; Her2 Neg (ratio 1.04), Ki 67: 12%;T1cN0M0 (stage IA)   08/27/2012 - 09/17/2012 Chemotherapy   2 cycles of Taxotere, Cytoxan Could not tolerate it. Stopped chemo.   10/26/2012 - 11/22/2012 Radiation Therapy   Rsdiation to lumpectomy site   12/14/2012 -  Anti-estrogen oral therapy   Tamoxifen 20 mg daily until August 2015 switched to Arimidex 1 mg daily (given along with Effexor), planned treatment duration 7 years      CHIEF COMPLIANT: Follow-up of right breast cancer on anastrozole therapy  INTERVAL HISTORY: Shelia Obrien is a 79 y.o. with above-mentioned history of right breast cancer treated with lumpectomy, 2 cycles of adjuvant chemotherapy, radiation, and who is currently on anti-estrogen therapy with anastrozole. I last saw her 1.5 years ago. She presents to the clinic today for follow-up.   ALLERGIES:  has No Known Allergies.  MEDICATIONS:  Current Outpatient Medications  Medication Sig Dispense Refill  . anastrozole (ARIMIDEX) 1 MG tablet TAKE 1 TABLET DAILY 90 tablet 0  . b complex vitamins tablet Take 1 tablet by mouth daily.    . cetirizine (ZYRTEC) 10 MG tablet Take 10 mg by mouth daily.    Marland Kitchen levothyroxine (SYNTHROID, LEVOTHROID) 75 MCG tablet Take 75 mcg by mouth every morning.     Marland Kitchen losartan (COZAAR) 25 MG tablet     . omeprazole (PRILOSEC) 20 MG capsule Take 1 capsule (20  mg total) by mouth daily.    . rosuvastatin (CRESTOR) 10 MG tablet Take 10 mg by mouth daily.     . valACYclovir (VALTREX) 1000 MG tablet Take 1,000 mg by mouth 2 (two) times daily as needed (for cold sores).     . Vitamin D, Ergocalciferol, (DRISDOL) 50000 UNITS CAPS Take 5,000 Units by mouth every 14 (fourteen) days. Taken on Wednesdays.     No current facility-administered medications for this visit.    PHYSICAL EXAMINATION: ECOG PERFORMANCE STATUS: 1 - Symptomatic but completely ambulatory  Vitals:   07/27/19 1051  BP: (!) 149/75  Pulse: 79  Resp: 17  Temp: 97.8 F (36.6 C)  SpO2: 100%   Filed Weights   07/27/19 1051  Weight: 150 lb 12.8 oz (68.4 kg)    BREAST: No palpable masses or nodules in either right or left breasts. No palpable axillary supraclavicular or infraclavicular adenopathy no breast tenderness or nipple discharge. (exam performed in the presence of a chaperone)  LABORATORY DATA:  I have reviewed the data as listed CMP Latest Ref Rng & Units 12/07/2013 03/23/2013 10/28/2012  Glucose 70 - 140 mg/dl 113 87 101(H)  BUN 7.0 - 26.0 mg/dL 11.5 14.8 10  Creatinine 0.6 - 1.1 mg/dL 1.0 1.0 0.81  Sodium 136 - 145 mEq/L 142 143 141  Potassium 3.5 - 5.1 mEq/L 4.6 4.5 4.9  Chloride 96 - 112 mEq/L - - 107  CO2 22 - 29 mEq/L 26 22 28  Calcium 8.4 - 10.4 mg/dL 9.2 9.7 9.7  Total Protein 6.4 - 8.3 g/dL 6.6 6.8 -  Total Bilirubin 0.20 - 1.20 mg/dL 0.52 0.73 -  Alkaline Phos 40 - 150 U/L 41 47 -  AST 5 - 34 U/L 33 31 -  ALT 0 - 55 U/L 30 25 -    Lab Results  Component Value Date   WBC 7.9 12/07/2013   HGB 13.6 12/07/2013   HCT 40.7 12/07/2013   MCV 94.2 12/07/2013   PLT 209 12/07/2013   NEUTROABS 3.4 12/07/2013    ASSESSMENT & PLAN:  Breast cancer of lower-inner quadrant of right female breast Stage IA right breast cancer diagnosed in March 2014 treated with lumpectomy. She was given 2 cycles of Taxotere and Cytoxan but she could not tolerate it so it was  discontinued (due to high Oncotype DX recurrence score). Because she was ER/PR positive she was started on tamoxifen in September 2014 switched to anastrozole August 2015.  Completed antiestrogen therapy 07/27/2019  Aromatase inhibitor toxicities: 1. Hot flashes:could not tolerate Effexor XR. 2. Muscle aches and pains: She is looking forward to discontinuing anastrozole therapy.  I recommended discontinuation of antiestrogen therapy based upon 7 years of antiestrogen's.  She is super happy about this and is looking forward to feeling a lot better.  Breast cancer surveillance: 1. Breast exam  07/27/2019: Benign 2. Mammogram 10/19/2018 at [facility] abnormalities were seen. Breast density category B  Return to clinic on an as-needed basis    No orders of the defined types were placed in this encounter.  The patient has a good understanding of the overall plan. she agrees with it. she will call with any problems that may develop before the next visit here.  Total time spent: 20 mins including face to face time and time spent for planning, charting and coordination of care  Nicholas Lose, MD 07/27/2019  I, Cloyde Reams Dorshimer, am acting as scribe for Dr. Nicholas Lose.  I have reviewed the above documentation for accuracy and completeness, and I agree with the above.

## 2019-07-27 ENCOUNTER — Inpatient Hospital Stay: Payer: Medicare HMO | Attending: Hematology and Oncology | Admitting: Hematology and Oncology

## 2019-07-27 ENCOUNTER — Other Ambulatory Visit: Payer: Self-pay

## 2019-07-27 DIAGNOSIS — Z79811 Long term (current) use of aromatase inhibitors: Secondary | ICD-10-CM | POA: Insufficient documentation

## 2019-07-27 DIAGNOSIS — Z170 Estrogen receptor positive status [ER+]: Secondary | ICD-10-CM | POA: Insufficient documentation

## 2019-07-27 DIAGNOSIS — C50311 Malignant neoplasm of lower-inner quadrant of right female breast: Secondary | ICD-10-CM | POA: Diagnosis not present

## 2019-07-27 MED ORDER — FAMOTIDINE 20 MG PO TABS: 20.0000 mg | ORAL_TABLET | Freq: Two times a day (BID) | ORAL | Status: AC

## 2019-07-27 MED ORDER — VITAMIN D 50 MCG (2000 UT) PO TABS: 2000.0000 [IU] | ORAL_TABLET | ORAL | Status: AC

## 2019-07-27 NOTE — Assessment & Plan Note (Signed)
Stage IA right breast cancer diagnosed in March 2014 treated with lumpectomy. She was given 2 cycles of Taxotere and Cytoxan but she could not tolerate it so it was discontinued (due to high Oncotype DX recurrence score). Because she was ER/PR positive she was started on tamoxifen in September 2014 switched to anastrozole August 2015.   Aromatase inhibitor toxicities: 1. Hot flashes:could not tolerate Effexor XR.Patient continues to have these hot flashes. 2. Muscle aches and pains especially left side of the back.She has a pinched nerve causes numbness on the right arm. She will discuss with Dr. Joylene Draft about getting physical therapy  Breast cancer surveillance: 1. Breast exam  07/27/2019: Benign 2. Mammogram April 2019 at [facility] abnormalities were seen. Breast density category B  Return to clinic in 1 year for follow-up

## 2019-09-19 DIAGNOSIS — M4302 Spondylolysis, cervical region: Secondary | ICD-10-CM | POA: Diagnosis not present

## 2019-09-19 DIAGNOSIS — E039 Hypothyroidism, unspecified: Secondary | ICD-10-CM | POA: Diagnosis not present

## 2019-09-19 DIAGNOSIS — R000 Tachycardia, unspecified: Secondary | ICD-10-CM | POA: Diagnosis not present

## 2019-09-19 DIAGNOSIS — C44519 Basal cell carcinoma of skin of other part of trunk: Secondary | ICD-10-CM | POA: Diagnosis not present

## 2019-09-19 DIAGNOSIS — M859 Disorder of bone density and structure, unspecified: Secondary | ICD-10-CM | POA: Diagnosis not present

## 2019-09-19 DIAGNOSIS — C50919 Malignant neoplasm of unspecified site of unspecified female breast: Secondary | ICD-10-CM | POA: Diagnosis not present

## 2019-09-19 DIAGNOSIS — R7301 Impaired fasting glucose: Secondary | ICD-10-CM | POA: Diagnosis not present

## 2019-09-19 DIAGNOSIS — G3184 Mild cognitive impairment, so stated: Secondary | ICD-10-CM | POA: Diagnosis not present

## 2019-09-19 DIAGNOSIS — M8580 Other specified disorders of bone density and structure, unspecified site: Secondary | ICD-10-CM | POA: Diagnosis not present

## 2019-09-23 ENCOUNTER — Encounter: Payer: Self-pay | Admitting: Cardiovascular Disease

## 2019-09-23 ENCOUNTER — Ambulatory Visit: Payer: Medicare HMO | Admitting: Cardiovascular Disease

## 2019-09-23 ENCOUNTER — Telehealth: Payer: Self-pay | Admitting: Radiology

## 2019-09-23 ENCOUNTER — Other Ambulatory Visit: Payer: Self-pay

## 2019-09-23 DIAGNOSIS — I10 Essential (primary) hypertension: Secondary | ICD-10-CM

## 2019-09-23 DIAGNOSIS — E785 Hyperlipidemia, unspecified: Secondary | ICD-10-CM | POA: Diagnosis not present

## 2019-09-23 DIAGNOSIS — R000 Tachycardia, unspecified: Secondary | ICD-10-CM | POA: Diagnosis not present

## 2019-09-23 NOTE — Patient Instructions (Addendum)
Medication Instructions:  The current medical regimen is effective;  continue present plan and medications.  *If you need a refill on your cardiac medications before your next appointment, please call your pharmacy*   Testing/Procedures:  Echocardiogram - Your physician has requested that you have an echocardiogram. Echocardiography is a painless test that uses sound waves to create images of your heart. It provides your doctor with information about the size and shape of your heart and how well your hearts chambers and valves are working. This procedure takes approximately one hour. There are no restrictions for this procedure. This will be performed at our [facility] location - [address].   Your physician has recommended that you wear an event monitor-30 day. Event monitors are medical devices that record the hearts electrical activity. Doctors most often [place] these monitors to diagnose arrhythmias. Arrhythmias are problems with the speed or rhythm of the heartbeat. The monitor is a small, portable device. You can wear one while you do your normal daily activities. This is usually used to diagnose what is causing palpitations/syncope (passing out). This will be performed at our [facility] location - [address], [address].    Follow-Up: At [facility], you and your health needs are our priority.  As part of our continuing mission to provide you with exceptional heart care, we have created designated Provider Care Teams.  These Care Teams include your primary Cardiologist (physician) and Advanced Practice Providers (APPs -  Physician Assistants and Nurse Practitioners) who all work together to provide you with the care you need, when you need it.  We recommend signing up for the patient portal called "MyChart".  Sign up information is provided on this After Visit Summary.  MyChart is used to connect with patients for Virtual Visits (Telemedicine).  Patients are able to view  lab/test results, encounter notes, upcoming appointments, etc.  Non-urgent messages can be sent to your provider as well.   To learn more about what you can do with MyChart, go to [email or web address].    Your next appointment:   As needed  The format for your next appointment:   In Person  Provider:   Quay Burow, MD

## 2019-09-23 NOTE — Assessment & Plan Note (Signed)
Recent episode of tachycardia on 09/09/2019 with heart rates sustained in the 1 60-1 70 range.  She did feel poorly all day.  Ultimately resolved spontaneously.  She has had no recurrence.  She has had episodes of syncope in the past of unclear etiology.  On occasion her daughter says that she calls her with complaints of feeling dizzy.  We will get a 30-day event monitor to further evaluate.

## 2019-09-23 NOTE — Assessment & Plan Note (Signed)
History of dyslipidemia on statin therapy with lipid profile performed 03/12/2019 revealing total cholesterol 121, LDL 62 and HDL 43.

## 2019-09-23 NOTE — Assessment & Plan Note (Signed)
History of essential potential blood pressure measured today 138/80.  She is on losartan.  She checks her blood pressure on a routine basis at home and for the most part it is under good control with occasional spikes.

## 2019-09-23 NOTE — Telephone Encounter (Signed)
Enrolled patient for a 30 day [employer] Event monitor to be mailed to patients home.  

## 2019-09-23 NOTE — Progress Notes (Signed)
09/23/2019 Shelia Obrien   [birth year]  [id number]  Primary Physician Shelia Infante, MD Primary Cardiologist: Shelia Harp MD Shelia Obrien, Shelia Obrien  HPI:  Shelia Obrien is a 79 y.o. mildly overweight widowed Caucasian female mother of 2 children, grandmother of 2 grandchildren referred by Shelia Obrien for evaluation of tachycardia.  She is accompanied by her daughter Shelia Obrien today.  She is retired from working at [employer] for 20 years, [employer] and [employer] for 26 years.  She has a history of treated hypertension hyperlipidemia.  She is a family history of heart disease with a brother who had bypass surgery and a sister who has had stents.  She is never had a heart attack or stroke.  She denies chest pain or shortness of breath.  She has had breast cancer with right breast lumpectomy, chemotherapy and radiation therapy.  She had an episode of sustained elevated heart rate in the 1 6170 range on 09/09/2019 for unclear reasons.  She did feel poorly that day.  She has had episodes of syncope in the past for unclear reasons and has mentioned episodes of dizziness as well.   Current Meds  Medication Sig  . b complex vitamins tablet Take 1 tablet by mouth daily.  . cetirizine (ZYRTEC) 10 MG tablet Take 10 mg by mouth daily.  . Cholecalciferol (VITAMIN D) 50 MCG (2000 UT) tablet Take 1 tablet (2,000 Units total) by mouth 3 (three) times a week.  . famotidine (PEPCID) 20 MG tablet Take 1 tablet (20 mg total) by mouth 2 (two) times daily.  Marland Kitchen levothyroxine (SYNTHROID, LEVOTHROID) 75 MCG tablet Take 75 mcg by mouth every morning.   Marland Kitchen losartan (COZAAR) 25 MG tablet   . rosuvastatin (CRESTOR) 10 MG tablet Take 10 mg by mouth daily.   . valACYclovir (VALTREX) 1000 MG tablet Take 1,000 mg by mouth 2 (two) times daily as needed (for cold sores).      No Known Allergies  Social History   Socioeconomic History  . Marital status: Widowed    Spouse name: Not on file  . Number of children: Not  on file  . Years of education: Not on file  . Highest education level: Not on file  Occupational History  . Not on file  Tobacco Use  . Smoking status: Never Smoker  . Smokeless tobacco: Never Used  Substance and Sexual Activity  . Alcohol use: No  . Drug use: No  . Sexual activity: Not Currently    Comment: menarche 50, P 2, HRT 14 + yrs  Other Topics Concern  . Not on file  Social History Narrative  . Not on file   Social Determinants of Health   Financial Resource Strain:   . Difficulty of Paying Living Expenses:   Food Insecurity:   . Worried About Charity fundraiser in the Last Year:   . Arboriculturist in the Last Year:   Transportation Needs:   . Film/video editor (Medical):   Marland Kitchen Lack of Transportation (Non-Medical):   Physical Activity:   . Days of Exercise per Week:   . Minutes of Exercise per Session:   Stress:   . Feeling of Stress :   Social Connections:   . Frequency of Communication with Friends and Family:   . Frequency of Social Gatherings with Friends and Family:   . Attends Religious Services:   . Active Member of Clubs or Organizations:   . Attends  Club or Organization Meetings:   Marland Kitchen Marital Status:   Intimate Partner Violence:   . Fear of Current or Ex-Partner:   . Emotionally Abused:   Marland Kitchen Physically Abused:   . Sexually Abused:      Review of Systems: General: negative for chills, fever, night sweats or weight changes.  Cardiovascular: negative for chest pain, dyspnea on exertion, edema, orthopnea, palpitations, paroxysmal nocturnal dyspnea or shortness of breath Dermatological: negative for rash Respiratory: negative for cough or wheezing Urologic: negative for hematuria Abdominal: negative for nausea, vomiting, diarrhea, bright red blood per rectum, melena, or hematemesis Neurologic: negative for visual changes, syncope, or dizziness All other systems reviewed and are otherwise negative except as noted above.    Blood pressure  138/80, pulse 69, temperature (!) 97.1 F (36.2 C), height 5\' 3"  (1.6 m), weight 148 lb 12.8 oz (67.5 kg).  General appearance: alert and no distress Neck: no adenopathy, no carotid bruit, no JVD, supple, symmetrical, trachea midline and thyroid not enlarged, symmetric, no tenderness/mass/nodules Lungs: clear to auscultation bilaterally Heart: regular rate and rhythm, S1, S2 normal, no murmur, click, rub or gallop Extremities: extremities normal, atraumatic, no cyanosis or edema Pulses: 2+ and symmetric Skin: Skin color, texture, turgor normal. No rashes or lesions Neurologic: Alert and oriented X 3, normal strength and tone. Normal symmetric reflexes. Normal coordination and gait  EKG sinus rhythm at 70 with PACs.  I personally reviewed this EKG.  ASSESSMENT AND PLAN:   Essential hypertension History of essential potential blood pressure measured today 138/80.  She is on losartan.  She checks her blood pressure on a routine basis at home and for the most part it is under good control with occasional spikes.  Dyslipidemia History of dyslipidemia on statin therapy with lipid profile performed 03/12/2019 revealing total cholesterol 121, LDL 62 and HDL 43.  Rapid heart rate Recent episode of tachycardia on 09/09/2019 with heart rates sustained in the 1 60-1 70 range.  She did feel poorly all day.  Ultimately resolved spontaneously.  She has had no recurrence.  She has had episodes of syncope in the past of unclear etiology.  On occasion her daughter says that she calls her with complaints of feeling dizzy.  We will get a 30-day event monitor to further evaluate.      Shelia Harp MD FACP,FACC,FAHA, [facility] 09/23/2019 9:12 AM

## 2019-09-29 NOTE — Addendum Note (Signed)
Addended by: Zebedee Iba on: 09/29/2019 01:09 PM   Modules accepted: Orders

## 2019-10-05 NOTE — Telephone Encounter (Signed)
Patient is requesting instructions on how to apply monitor. Please call to discuss.

## 2019-10-05 NOTE — Telephone Encounter (Signed)
The patient called stating that she has an ECHO next week and wanted to know if she should wait to place the monitor. She has been advised that she should wait as the monitor may be in the way of the echo.  She also wanted to know if someone could help her place the monitor on after she gets the echo done. She stated that she may need help.

## 2019-10-12 ENCOUNTER — Encounter (INDEPENDENT_AMBULATORY_CARE_PROVIDER_SITE_OTHER): Payer: Medicare HMO

## 2019-10-12 ENCOUNTER — Ambulatory Visit (HOSPITAL_COMMUNITY): Payer: Medicare HMO | Attending: Cardiovascular Disease

## 2019-10-12 ENCOUNTER — Other Ambulatory Visit: Payer: Self-pay

## 2019-10-12 DIAGNOSIS — I10 Essential (primary) hypertension: Secondary | ICD-10-CM | POA: Insufficient documentation

## 2019-10-12 DIAGNOSIS — E785 Hyperlipidemia, unspecified: Secondary | ICD-10-CM | POA: Insufficient documentation

## 2019-10-12 DIAGNOSIS — R000 Tachycardia, unspecified: Secondary | ICD-10-CM | POA: Diagnosis not present

## 2019-10-12 DIAGNOSIS — Z8249 Family history of ischemic heart disease and other diseases of the circulatory system: Secondary | ICD-10-CM | POA: Insufficient documentation

## 2019-10-12 DIAGNOSIS — R42 Dizziness and giddiness: Secondary | ICD-10-CM | POA: Diagnosis not present

## 2019-10-13 DIAGNOSIS — H26493 Other secondary cataract, bilateral: Secondary | ICD-10-CM | POA: Diagnosis not present

## 2019-10-13 DIAGNOSIS — Z961 Presence of intraocular lens: Secondary | ICD-10-CM | POA: Diagnosis not present

## 2019-10-13 DIAGNOSIS — H02834 Dermatochalasis of left upper eyelid: Secondary | ICD-10-CM | POA: Diagnosis not present

## 2019-10-25 ENCOUNTER — Telehealth: Payer: Self-pay | Admitting: Cardiovascular Disease

## 2019-10-25 NOTE — Telephone Encounter (Signed)
[employer] called- stating that patient monitor showed AFIB RVR- they contact patient, and she was replacing the monitor strip- they will fax report over. Will be on the lookout.

## 2019-10-25 NOTE — Telephone Encounter (Signed)
Strip was signed by DOD- notified that this was because she was changing monitor-  Signed and placed in MD box. Patient was asymptomatic

## 2019-10-25 NOTE — Telephone Encounter (Signed)
New Message  Shelia Obrien calling in from [employer] about patient's monitor results. Wanting to speak with a nurse, transferred to triage.

## 2019-10-27 ENCOUNTER — Telehealth: Payer: Self-pay | Admitting: *Deleted

## 2019-10-27 NOTE — Telephone Encounter (Signed)
[employer] notified office of serious event from 10/26/19 9:28 pm CST showing atrial fib RVR sustained sinus Rhythm w/PACs rate 160.  Spoke with pt and during that had showered and was asymtomatic  Discussed with Dr Sallyanne Kuster  Continue to monitor and pt needs an appt to discuss possible anticoagulation therapy ./cy

## 2019-10-27 NOTE — Telephone Encounter (Signed)
APPT MADE FOR 11/18/19 AT 9:30 AM PT AWARE .Adonis Housekeeper

## 2019-10-28 LAB — ECHOCARDIOGRAM COMPLETE
Area-P 1/2: 3 cm2
S' Lateral: 2.87 cm

## 2019-10-30 NOTE — Telephone Encounter (Signed)
Thx

## 2019-11-18 ENCOUNTER — Ambulatory Visit: Payer: Medicare HMO | Admitting: Cardiovascular Disease

## 2019-11-18 ENCOUNTER — Other Ambulatory Visit: Payer: Self-pay

## 2019-11-18 ENCOUNTER — Encounter: Payer: Self-pay | Admitting: Cardiovascular Disease

## 2019-11-18 VITALS — BP 134/82 | HR 73 | Ht 63.0 in | Wt 152.8 lb

## 2019-11-18 DIAGNOSIS — I480 Paroxysmal atrial fibrillation: Secondary | ICD-10-CM | POA: Diagnosis not present

## 2019-11-18 MED ORDER — METOPROLOL SUCCINATE ER 25 MG PO TB24: 25.0000 mg | ORAL_TABLET | Freq: Every day | ORAL | 3 refills | Status: DC

## 2019-11-18 MED ORDER — APIXABAN 5 MG PO TABS
5.0000 mg | ORAL_TABLET | Freq: Two times a day (BID) | ORAL | 11 refills | Status: DC
Start: 2019-11-18 — End: 2019-11-18

## 2019-11-18 MED ORDER — METOPROLOL SUCCINATE ER 25 MG PO TB24
25.0000 mg | ORAL_TABLET | Freq: Every day | ORAL | 3 refills | Status: DC
Start: 2019-11-18 — End: 2019-11-18

## 2019-11-18 MED ORDER — APIXABAN 5 MG PO TABS: 5.0000 mg | ORAL_TABLET | Freq: Two times a day (BID) | ORAL | 3 refills | Status: DC

## 2019-11-18 NOTE — Patient Instructions (Signed)
Medication Instructions:  START metoprolol succinate 25mg  daily START eliquis twice daily - blood thinner  *If you need a refill on your cardiac medications before your next appointment, please call your pharmacy*  Follow-Up: At [facility], you and your health needs are our priority.  As part of our continuing mission to provide you with exceptional heart care, we have created designated Provider Care Teams.  These Care Teams include your primary Cardiologist (physician) and Advanced Practice Providers (APPs -  Physician Assistants and Nurse Practitioners) who all work together to provide you with the care you need, when you need it.  We recommend signing up for the patient portal called "MyChart".  Sign up information is provided on this After Visit Summary.  MyChart is used to connect with patients for Virtual Visits (Telemedicine).  Patients are able to view lab/test results, encounter notes, upcoming appointments, etc.  Non-urgent messages can be sent to your provider as well.   To learn more about what you can do with MyChart, go to [email or web address].    Your next appointment:   3 month(s) with one of the following Advanced Practice Providers on your designated Care Team:    Kerin Ransom, PA-C  [place], [place]  Coletta Memos, [place]  6 month(s) with Dr. Gwenlyn Found   The format for your next appointment:   In Person

## 2019-11-18 NOTE — Addendum Note (Signed)
Addended by: Fidel Levy on: 11/18/2019 10:49 AM   Modules accepted: Orders

## 2019-11-18 NOTE — Progress Notes (Signed)
Ms. Carpenito returns today for follow-up of her 2D echo and event monitor.  Her EF was normal with diastolic dysfunction grade 1.  Her event monitor showed episodes of PAF with RVR although she was unaware of these. The CHA2DSVASC2 score is  3 .  I am going to begin her on Toprol-XL 25 mg a day and start her on Eliquis oral anticoagulation for stroke prophylaxis.  She will see APP back in 3 months and I will see her back in 6 months.  Lorretta Harp, M.D., [place], [facility], [place] [address] [place], [place]  [address]  [phone] 11/18/2019 10:18 AM

## 2019-11-24 DIAGNOSIS — L905 Scar conditions and fibrosis of skin: Secondary | ICD-10-CM | POA: Diagnosis not present

## 2019-11-24 DIAGNOSIS — L570 Actinic keratosis: Secondary | ICD-10-CM | POA: Diagnosis not present

## 2019-11-24 DIAGNOSIS — L819 Disorder of pigmentation, unspecified: Secondary | ICD-10-CM | POA: Diagnosis not present

## 2019-11-24 DIAGNOSIS — L718 Other rosacea: Secondary | ICD-10-CM | POA: Diagnosis not present

## 2019-11-24 DIAGNOSIS — Z85828 Personal history of other malignant neoplasm of skin: Secondary | ICD-10-CM | POA: Diagnosis not present

## 2019-12-21 DIAGNOSIS — Z1231 Encounter for screening mammogram for malignant neoplasm of breast: Secondary | ICD-10-CM | POA: Diagnosis not present

## 2019-12-22 DIAGNOSIS — I480 Paroxysmal atrial fibrillation: Secondary | ICD-10-CM | POA: Diagnosis not present

## 2019-12-22 DIAGNOSIS — J309 Allergic rhinitis, unspecified: Secondary | ICD-10-CM | POA: Diagnosis not present

## 2019-12-22 DIAGNOSIS — Z1152 Encounter for screening for COVID-19: Secondary | ICD-10-CM | POA: Diagnosis not present

## 2019-12-22 DIAGNOSIS — J0100 Acute maxillary sinusitis, unspecified: Secondary | ICD-10-CM | POA: Diagnosis not present

## 2020-01-14 DIAGNOSIS — Z23 Encounter for immunization: Secondary | ICD-10-CM | POA: Diagnosis not present

## 2020-01-25 DIAGNOSIS — S53402A Unspecified sprain of left elbow, initial encounter: Secondary | ICD-10-CM | POA: Diagnosis not present

## 2020-01-25 DIAGNOSIS — S46012A Strain of muscle(s) and tendon(s) of the rotator cuff of left shoulder, initial encounter: Secondary | ICD-10-CM | POA: Diagnosis not present

## 2020-01-25 DIAGNOSIS — S63502A Unspecified sprain of left wrist, initial encounter: Secondary | ICD-10-CM | POA: Diagnosis not present

## 2020-02-02 DIAGNOSIS — S46012D Strain of muscle(s) and tendon(s) of the rotator cuff of left shoulder, subsequent encounter: Secondary | ICD-10-CM | POA: Diagnosis not present

## 2020-02-10 DIAGNOSIS — M25512 Pain in left shoulder: Secondary | ICD-10-CM | POA: Diagnosis not present

## 2020-02-16 NOTE — Progress Notes (Signed)
Cardiology Clinic Note   Patient Name: Shelia Obrien Date of Encounter: 02/17/2020  Primary Care Provider:  Crist Infante, MD Primary Cardiologist:  Quay Burow, MD  Patient Profile    Shelia Obrien 79 year old female presents to the clinic today for follow-up evaluation of her paroxysmal atrial fibrillation.  Past Medical History    Past Medical History:  Diagnosis Date   Acid reflux disease    Arthritis    Breast cancer ([place]) 06/24/12   right, lower inner   Bronchitis    Epistaxis    went to [facility] ER   High cholesterol    History of radiation therapy 10/26/2012-11/22/2012   49 gray to the right breast   Hypertension june 2014   no current bp meds   Hypothyroidism    Leaking of urine    Dribbles if coughs. Pt wears pad   PONV (postoperative nausea and vomiting)    Seasonal allergies    Shortness of breath    Swelling of left lower extremity    x 2 weeks, no sob   Swelling of right lower extremity x 2 weeks   no sob   Thyroid disease    Use of tamoxifen (Nolvadex)    Wears glasses    Past Surgical History:  Procedure Laterality Date   ABDOMINAL HYSTERECTOMY  04/1975   partial, heavy menses   BREAST LUMPECTOMY WITH NEEDLE LOCALIZATION AND AXILLARY SENTINEL LYMPH NODE BX Right 07/22/2012   Procedure: BREAST LUMPECTOMY WITH NEEDLE LOCALIZATION AND AXILLARY SENTINEL LYMPH NODE BX;  Surgeon: Stark Klein, MD;  Location: [place];  Service: General;  Laterality: Right;   CARDIOVASCULAR STRESS TEST  12/07/2012   [facility]   CHOLECYSTECTOMY  06/21/96   COLONOSCOPY     KNEE ARTHROSCOPY Left 04/11/02   NASAL SEPTUM SURGERY  04/01/95   PORT-A-CATH REMOVAL N/A 11/02/2012   Procedure: REMOVAL PORT-A-CATH;  Surgeon: Stark Klein, MD;  Location: [facility];  Service: General;  Laterality: N/A;   PORTACATH PLACEMENT Left 08/25/2012   Procedure: INSERTION PORT-A-CATH;  Surgeon: Stark Klein, MD;  Location: [place];  Service:  General;  Laterality: Left;   UPPER GI ENDOSCOPY     last in 05/10/10    Allergies  No Known Allergies  History of Present Illness    Shelia Obrien has a PMH of paroxysmal atrial fibrillation, tachycardia, HTN, HLD, and breast CA.  She also has a family history of heart disease with a brother who had bypass surgery and a sister who had stents placed.  She has never had a heart attack or stroke.  She was last seen by Dr. Gwenlyn Found on 11/18/2019.  During that time her echocardiogram and cardiac event monitor were reviewed.  Her echocardiogram showed normal LV function with G1 DD.  Her cardiac event monitor showed PAF with RVR.  She was cardiac unaware and unaware of these episodes.  CHA2DS2-VASc score 3.  She was started on metoprolol 25 mg daily and apixaban.  She denied chest pain and shortness of breath.  She was previously noted to have episodes of syncope in the past with unknown reason.  She previously mentioned episodes of dizziness as well.  She presents to the clinic today for follow-up evaluation and states she feels well.  She has been somewhat limited in her physical activity due to a mechanical fall that she had where she injured her left shoulder.(Left shoulder in sling today, she is seeing orthopedics) her blood pressures continue to be well  controlled at home.  She is asymptomatic with her atrial fibrillation.  She reports compliance with her apixaban.  She remains physically active doing housework, yard work and other activities around her house.  I will give her the salty 6 diet sheet, have her continue to increase her physical activity as tolerated, and follow-up in 6 months and as needed.  Today she denies chest pain, shortness of breath, lower extremity edema, fatigue, palpitations, melena, hematuria, hemoptysis, diaphoresis, weakness, presyncope, syncope, orthopnea, and PND.   Home Medications    Prior to Admission medications   Medication Sig Start Date End Date Taking?  Authorizing Provider  apixaban (ELIQUIS) 5 MG TABS tablet Take 1 tablet (5 mg total) by mouth 2 (two) times daily. 11/18/19   Lorretta Harp, MD  b complex vitamins tablet Take 1 tablet by mouth daily.    [provider]  cetirizine (ZYRTEC) 10 MG tablet Take 10 mg by mouth daily.    [provider]  Cholecalciferol (VITAMIN D) 50 MCG (2000 UT) tablet Take 1 tablet (2,000 Units total) by mouth 3 (three) times a week. 07/27/19   Nicholas Lose, MD  famotidine (PEPCID) 20 MG tablet Take 1 tablet (20 mg total) by mouth 2 (two) times daily. 07/27/19   Nicholas Lose, MD  levothyroxine (SYNTHROID, LEVOTHROID) 75 MCG tablet Take 75 mcg by mouth every morning.     [provider]  losartan (COZAAR) 25 MG tablet  11/11/14   [provider]  metoprolol succinate (TOPROL XL) 25 MG 24 hr tablet Take 1 tablet (25 mg total) by mouth daily. 11/18/19   Lorretta Harp, MD  rosuvastatin (CRESTOR) 10 MG tablet Take 10 mg by mouth daily.     [provider]  valACYclovir (VALTREX) 1000 MG tablet Take 1,000 mg by mouth 2 (two) times daily as needed (for cold sores).  08/26/12   [provider]    Family History    Family History  Problem Relation Age of Onset   Prostate cancer Father    Cancer Father        stomach   Cirrhosis Mother    Prostate cancer Brother    Diabetes Brother    Breast cancer Maternal Aunt    Lung cancer Maternal Uncle    Lung cancer Maternal Grandmother    Kidney cancer Maternal Grandmother    She indicated that her mother is deceased. She indicated that her father is deceased. She indicated that her sister is alive. She indicated that her brother is alive. She indicated that the status of her maternal grandmother is unknown. She indicated that the status of her maternal aunt is unknown. She indicated that the status of her maternal uncle is unknown.  Social History    Social History   Socioeconomic History   Marital  status: Widowed    Spouse name: Not on file   Number of children: Not on file   Years of education: Not on file   Highest education level: Not on file  Occupational History   Not on file  Tobacco Use   Smoking status: Never Smoker   Smokeless tobacco: Never Used  Substance and Sexual Activity   Alcohol use: No   Drug use: No   Sexual activity: Not Currently    Comment: menarche 9, P 2, HRT 14 + yrs  Other Topics Concern   Not on file  Social History Narrative   Not on file   Social Determinants of Health  Financial Resource Strain:    Difficulty of Paying Living Expenses: Not on file  Food Insecurity:    Worried About Charity fundraiser in the Last Year: Not on file   [employer] of Food in the Last Year: Not on file  Transportation Needs:    Lack of Transportation (Medical): Not on file   Lack of Transportation (Non-Medical): Not on file  Physical Activity:    Days of Exercise per Week: Not on file   Minutes of Exercise per Session: Not on file  Stress:    Feeling of Stress : Not on file  Social Connections:    Frequency of Communication with Friends and Family: Not on file   Frequency of Social Gatherings with Friends and Family: Not on file   Attends Religious Services: Not on file   Active Member of Clubs or Organizations: Not on file   Attends Archivist Meetings: Not on file   Marital Status: Not on file  Intimate Partner Violence:    Fear of Current or Ex-Partner: Not on file   Emotionally Abused: Not on file   Physically Abused: Not on file   Sexually Abused: Not on file     Review of Systems    General:  No chills, fever, night sweats or weight changes.  Cardiovascular:  No chest pain, dyspnea on exertion, edema, orthopnea, palpitations, paroxysmal nocturnal dyspnea. Dermatological: No rash, lesions/masses Respiratory: No cough, dyspnea Urologic: No hematuria, dysuria Abdominal:   No nausea, vomiting, diarrhea,  bright red blood per rectum, melena, or hematemesis Neurologic:  No visual changes, wkns, changes in mental status. All other systems reviewed and are otherwise negative except as noted above.  Physical Exam    VS:  BP 130/84    Pulse 72    Ht 5\' 3"  (1.6 m)    Wt 148 lb 9.6 oz (67.4 kg)    SpO2 95%    BMI 26.32 kg/m  , BMI Body mass index is 26.32 kg/m. GEN: Well nourished, well developed, in no acute distress. HEENT: normal. Neck: Supple, no JVD, carotid bruits, or masses. Cardiac: RRR, no murmurs, rubs, or gallops. No clubbing, cyanosis, edema.  Radials/DP/PT 2+ and equal bilaterally.  Respiratory:  Respirations regular and unlabored, clear to auscultation bilaterally. GI: Soft, nontender, nondistended, BS + x 4. MS: no deformity or atrophy. Skin: warm and dry, no rash. Neuro:  Strength and sensation are intact. Psych: Normal affect.  Accessory Clinical Findings    Recent Labs: No results found for requested labs within last 8760 hours.   Recent Lipid Panel No results found for: CHOL, TRIG, HDL, CHOLHDL, VLDL, LDLCALC, LDLDIRECT  ECG personally reviewed by me today-none today.  Cardiac event monitor 11/11/2019  1.Sinus rhythm/sinus bradycardia/sinus tachycardia 2: Occasional PACs and PVCs 3: Runs of PAF with RVR 4: Needs return office visit to discuss   Assessment & Plan   1.  Paroxysmal atrial fibrillation-heart rate today 72.  Denies episodes of accelerated heart rates or irregular beats.  Denies bleeding issues, falls, or injuries. Continue metoprolol, apixaban  heart healthy low-sodium diet-salty 6 given Increase physical activity as tolerated Avoid triggers caffeine, chocolate, EtOH etc.  Essential hypertension-BP today 130/84.  Well-controlled at home Continue losartan Heart healthy low-sodium diet-salty 6 given Increase physical activity as tolerated  Dyslipidemia-LDL 62 on 03/12/2019. Continue rosuvastatin Heart healthy low-sodium high-fiber  diet Increase physical activity as tolerated  Disposition: Follow-up with Dr. Gwenlyn Found in 6 months.   Jossie Ng. Emlyn Maves NP-C  02/17/2020, 2:03 PM [place] [address] Office [phone] Fax [phone]  Notice: This dictation was prepared with Dragon dictation along with smaller phrase technology. Any transcriptional errors that result from this process are unintentional and may not be corrected upon review.

## 2020-02-17 ENCOUNTER — Other Ambulatory Visit: Payer: Self-pay

## 2020-02-17 ENCOUNTER — Ambulatory Visit: Payer: Medicare HMO | Admitting: General Practice

## 2020-02-17 ENCOUNTER — Encounter: Payer: Self-pay | Admitting: General Practice

## 2020-02-17 VITALS — BP 130/84 | HR 72 | Ht 63.0 in | Wt 148.6 lb

## 2020-02-17 DIAGNOSIS — I480 Paroxysmal atrial fibrillation: Secondary | ICD-10-CM

## 2020-02-17 DIAGNOSIS — E785 Hyperlipidemia, unspecified: Secondary | ICD-10-CM | POA: Diagnosis not present

## 2020-02-17 DIAGNOSIS — I10 Essential (primary) hypertension: Secondary | ICD-10-CM | POA: Diagnosis not present

## 2020-02-17 NOTE — Patient Instructions (Signed)
Medication Instructions:  The current medical regimen is effective;  continue present plan and medications as directed. Please refer to the Current Medication list given to you today.  *If you need a refill on your cardiac medications before your next appointment, please call your pharmacy*  Lab Work:   Testing/Procedures:  NONE    NONE  Special Instructions  PLEASE READ AND FOLLOW SALTY 6-ATTACHED-1,800mg  daily  Follow-Up: Your next appointment:  6 month(s) EITHER VIRTUAL OR OFFICE VISIT with Quay Burow, MD OR IF UNAVAILABLE [place], FNP-C  At [facility], you and your health needs are our priority.  As part of our continuing mission to provide you with exceptional heart care, we have created designated Provider Care Teams.  These Care Teams include your primary Cardiologist (physician) and Advanced Practice Providers (APPs -  Physician Assistants and Nurse Practitioners) who all work together to provide you with the care you need, when you need it.            6 SALTY THINGS TO AVOID     1,800MG  DAILY

## 2020-02-27 DIAGNOSIS — S42295A Other nondisplaced fracture of upper end of left humerus, initial encounter for closed fracture: Secondary | ICD-10-CM | POA: Diagnosis not present

## 2020-03-16 DIAGNOSIS — R7301 Impaired fasting glucose: Secondary | ICD-10-CM | POA: Diagnosis not present

## 2020-03-16 DIAGNOSIS — E039 Hypothyroidism, unspecified: Secondary | ICD-10-CM | POA: Diagnosis not present

## 2020-03-16 DIAGNOSIS — E785 Hyperlipidemia, unspecified: Secondary | ICD-10-CM | POA: Diagnosis not present

## 2020-03-16 DIAGNOSIS — E559 Vitamin D deficiency, unspecified: Secondary | ICD-10-CM | POA: Diagnosis not present

## 2020-03-19 DIAGNOSIS — S42295D Other nondisplaced fracture of upper end of left humerus, subsequent encounter for fracture with routine healing: Secondary | ICD-10-CM | POA: Diagnosis not present

## 2020-03-23 DIAGNOSIS — I872 Venous insufficiency (chronic) (peripheral): Secondary | ICD-10-CM | POA: Diagnosis not present

## 2020-03-23 DIAGNOSIS — I10 Essential (primary) hypertension: Secondary | ICD-10-CM | POA: Diagnosis not present

## 2020-03-23 DIAGNOSIS — M8580 Other specified disorders of bone density and structure, unspecified site: Secondary | ICD-10-CM | POA: Diagnosis not present

## 2020-03-23 DIAGNOSIS — R002 Palpitations: Secondary | ICD-10-CM | POA: Diagnosis not present

## 2020-03-23 DIAGNOSIS — I480 Paroxysmal atrial fibrillation: Secondary | ICD-10-CM | POA: Diagnosis not present

## 2020-03-23 DIAGNOSIS — G4700 Insomnia, unspecified: Secondary | ICD-10-CM | POA: Diagnosis not present

## 2020-03-23 DIAGNOSIS — Z1212 Encounter for screening for malignant neoplasm of rectum: Secondary | ICD-10-CM | POA: Diagnosis not present

## 2020-03-23 DIAGNOSIS — M1990 Unspecified osteoarthritis, unspecified site: Secondary | ICD-10-CM | POA: Diagnosis not present

## 2020-03-23 DIAGNOSIS — R82998 Other abnormal findings in urine: Secondary | ICD-10-CM | POA: Diagnosis not present

## 2020-03-23 DIAGNOSIS — E039 Hypothyroidism, unspecified: Secondary | ICD-10-CM | POA: Diagnosis not present

## 2020-03-23 DIAGNOSIS — Z0000 Encounter for general adult medical examination without abnormal findings: Secondary | ICD-10-CM | POA: Diagnosis not present

## 2020-03-23 DIAGNOSIS — J45998 Other asthma: Secondary | ICD-10-CM | POA: Diagnosis not present

## 2020-03-26 DIAGNOSIS — S42295D Other nondisplaced fracture of upper end of left humerus, subsequent encounter for fracture with routine healing: Secondary | ICD-10-CM | POA: Diagnosis not present

## 2020-03-26 DIAGNOSIS — M25612 Stiffness of left shoulder, not elsewhere classified: Secondary | ICD-10-CM | POA: Diagnosis not present

## 2020-03-26 DIAGNOSIS — M6281 Muscle weakness (generalized): Secondary | ICD-10-CM | POA: Diagnosis not present

## 2020-03-26 DIAGNOSIS — M25512 Pain in left shoulder: Secondary | ICD-10-CM | POA: Diagnosis not present

## 2020-04-02 DIAGNOSIS — S42295D Other nondisplaced fracture of upper end of left humerus, subsequent encounter for fracture with routine healing: Secondary | ICD-10-CM | POA: Diagnosis not present

## 2020-04-02 DIAGNOSIS — M25612 Stiffness of left shoulder, not elsewhere classified: Secondary | ICD-10-CM | POA: Diagnosis not present

## 2020-04-02 DIAGNOSIS — M6281 Muscle weakness (generalized): Secondary | ICD-10-CM | POA: Diagnosis not present

## 2020-04-02 DIAGNOSIS — M25512 Pain in left shoulder: Secondary | ICD-10-CM | POA: Diagnosis not present

## 2020-04-19 DIAGNOSIS — M8589 Other specified disorders of bone density and structure, multiple sites: Secondary | ICD-10-CM | POA: Diagnosis not present

## 2020-04-19 DIAGNOSIS — M4302 Spondylolysis, cervical region: Secondary | ICD-10-CM | POA: Diagnosis not present

## 2020-04-19 DIAGNOSIS — Z23 Encounter for immunization: Secondary | ICD-10-CM | POA: Diagnosis not present

## 2020-04-19 DIAGNOSIS — C50919 Malignant neoplasm of unspecified site of unspecified female breast: Secondary | ICD-10-CM | POA: Diagnosis not present

## 2020-04-19 DIAGNOSIS — M1990 Unspecified osteoarthritis, unspecified site: Secondary | ICD-10-CM | POA: Diagnosis not present

## 2020-04-19 DIAGNOSIS — E559 Vitamin D deficiency, unspecified: Secondary | ICD-10-CM | POA: Diagnosis not present

## 2020-05-02 DIAGNOSIS — S42295D Other nondisplaced fracture of upper end of left humerus, subsequent encounter for fracture with routine healing: Secondary | ICD-10-CM | POA: Diagnosis not present

## 2020-05-02 DIAGNOSIS — M25512 Pain in left shoulder: Secondary | ICD-10-CM | POA: Diagnosis not present

## 2020-05-02 DIAGNOSIS — M25612 Stiffness of left shoulder, not elsewhere classified: Secondary | ICD-10-CM | POA: Diagnosis not present

## 2020-05-02 DIAGNOSIS — M6281 Muscle weakness (generalized): Secondary | ICD-10-CM | POA: Diagnosis not present

## 2020-05-04 DIAGNOSIS — M6281 Muscle weakness (generalized): Secondary | ICD-10-CM | POA: Diagnosis not present

## 2020-05-04 DIAGNOSIS — S42295D Other nondisplaced fracture of upper end of left humerus, subsequent encounter for fracture with routine healing: Secondary | ICD-10-CM | POA: Diagnosis not present

## 2020-05-04 DIAGNOSIS — M25612 Stiffness of left shoulder, not elsewhere classified: Secondary | ICD-10-CM | POA: Diagnosis not present

## 2020-05-04 DIAGNOSIS — M25512 Pain in left shoulder: Secondary | ICD-10-CM | POA: Diagnosis not present

## 2020-05-08 DIAGNOSIS — M25612 Stiffness of left shoulder, not elsewhere classified: Secondary | ICD-10-CM | POA: Diagnosis not present

## 2020-05-08 DIAGNOSIS — M6281 Muscle weakness (generalized): Secondary | ICD-10-CM | POA: Diagnosis not present

## 2020-05-08 DIAGNOSIS — S42295D Other nondisplaced fracture of upper end of left humerus, subsequent encounter for fracture with routine healing: Secondary | ICD-10-CM | POA: Diagnosis not present

## 2020-05-08 DIAGNOSIS — M25512 Pain in left shoulder: Secondary | ICD-10-CM | POA: Diagnosis not present

## 2020-05-11 DIAGNOSIS — S42295D Other nondisplaced fracture of upper end of left humerus, subsequent encounter for fracture with routine healing: Secondary | ICD-10-CM | POA: Diagnosis not present

## 2020-05-11 DIAGNOSIS — M6281 Muscle weakness (generalized): Secondary | ICD-10-CM | POA: Diagnosis not present

## 2020-05-11 DIAGNOSIS — M25512 Pain in left shoulder: Secondary | ICD-10-CM | POA: Diagnosis not present

## 2020-05-11 DIAGNOSIS — M25612 Stiffness of left shoulder, not elsewhere classified: Secondary | ICD-10-CM | POA: Diagnosis not present

## 2020-05-16 DIAGNOSIS — S42295D Other nondisplaced fracture of upper end of left humerus, subsequent encounter for fracture with routine healing: Secondary | ICD-10-CM | POA: Diagnosis not present

## 2020-05-16 DIAGNOSIS — M25512 Pain in left shoulder: Secondary | ICD-10-CM | POA: Diagnosis not present

## 2020-05-16 DIAGNOSIS — M25612 Stiffness of left shoulder, not elsewhere classified: Secondary | ICD-10-CM | POA: Diagnosis not present

## 2020-05-16 DIAGNOSIS — M6281 Muscle weakness (generalized): Secondary | ICD-10-CM | POA: Diagnosis not present

## 2020-05-17 DIAGNOSIS — L821 Other seborrheic keratosis: Secondary | ICD-10-CM | POA: Diagnosis not present

## 2020-05-17 DIAGNOSIS — D485 Neoplasm of uncertain behavior of skin: Secondary | ICD-10-CM | POA: Diagnosis not present

## 2020-05-17 DIAGNOSIS — Z85828 Personal history of other malignant neoplasm of skin: Secondary | ICD-10-CM | POA: Diagnosis not present

## 2020-05-17 DIAGNOSIS — I8393 Asymptomatic varicose veins of bilateral lower extremities: Secondary | ICD-10-CM | POA: Diagnosis not present

## 2020-05-17 DIAGNOSIS — C44529 Squamous cell carcinoma of skin of other part of trunk: Secondary | ICD-10-CM | POA: Diagnosis not present

## 2020-05-17 DIAGNOSIS — L819 Disorder of pigmentation, unspecified: Secondary | ICD-10-CM | POA: Diagnosis not present

## 2020-05-17 DIAGNOSIS — D229 Melanocytic nevi, unspecified: Secondary | ICD-10-CM | POA: Diagnosis not present

## 2020-05-17 DIAGNOSIS — L905 Scar conditions and fibrosis of skin: Secondary | ICD-10-CM | POA: Diagnosis not present

## 2020-05-17 DIAGNOSIS — L814 Other melanin hyperpigmentation: Secondary | ICD-10-CM | POA: Diagnosis not present

## 2020-05-17 DIAGNOSIS — L570 Actinic keratosis: Secondary | ICD-10-CM | POA: Diagnosis not present

## 2020-05-18 DIAGNOSIS — C44622 Squamous cell carcinoma of skin of right upper limb, including shoulder: Secondary | ICD-10-CM | POA: Diagnosis not present

## 2020-05-22 DIAGNOSIS — S42295D Other nondisplaced fracture of upper end of left humerus, subsequent encounter for fracture with routine healing: Secondary | ICD-10-CM | POA: Diagnosis not present

## 2020-05-22 DIAGNOSIS — M6281 Muscle weakness (generalized): Secondary | ICD-10-CM | POA: Diagnosis not present

## 2020-05-22 DIAGNOSIS — M25512 Pain in left shoulder: Secondary | ICD-10-CM | POA: Diagnosis not present

## 2020-05-22 DIAGNOSIS — M25612 Stiffness of left shoulder, not elsewhere classified: Secondary | ICD-10-CM | POA: Diagnosis not present

## 2020-05-25 DIAGNOSIS — M6281 Muscle weakness (generalized): Secondary | ICD-10-CM | POA: Diagnosis not present

## 2020-05-25 DIAGNOSIS — M25512 Pain in left shoulder: Secondary | ICD-10-CM | POA: Diagnosis not present

## 2020-05-25 DIAGNOSIS — S42295D Other nondisplaced fracture of upper end of left humerus, subsequent encounter for fracture with routine healing: Secondary | ICD-10-CM | POA: Diagnosis not present

## 2020-05-25 DIAGNOSIS — M25612 Stiffness of left shoulder, not elsewhere classified: Secondary | ICD-10-CM | POA: Diagnosis not present

## 2020-07-12 DIAGNOSIS — I10 Essential (primary) hypertension: Secondary | ICD-10-CM | POA: Diagnosis not present

## 2020-07-12 DIAGNOSIS — I480 Paroxysmal atrial fibrillation: Secondary | ICD-10-CM | POA: Diagnosis not present

## 2020-07-12 DIAGNOSIS — E785 Hyperlipidemia, unspecified: Secondary | ICD-10-CM | POA: Diagnosis not present

## 2020-07-12 DIAGNOSIS — J309 Allergic rhinitis, unspecified: Secondary | ICD-10-CM | POA: Diagnosis not present

## 2020-07-12 DIAGNOSIS — K219 Gastro-esophageal reflux disease without esophagitis: Secondary | ICD-10-CM | POA: Diagnosis not present

## 2020-08-17 ENCOUNTER — Ambulatory Visit: Payer: Medicare HMO | Admitting: Cardiovascular Disease

## 2020-08-17 ENCOUNTER — Other Ambulatory Visit: Payer: Self-pay

## 2020-08-17 ENCOUNTER — Encounter: Payer: Self-pay | Admitting: Cardiovascular Disease

## 2020-08-17 VITALS — BP 116/58 | HR 70 | Ht 63.0 in | Wt 160.0 lb

## 2020-08-17 DIAGNOSIS — E785 Hyperlipidemia, unspecified: Secondary | ICD-10-CM

## 2020-08-17 DIAGNOSIS — I480 Paroxysmal atrial fibrillation: Secondary | ICD-10-CM

## 2020-08-17 DIAGNOSIS — I10 Essential (primary) hypertension: Secondary | ICD-10-CM

## 2020-08-17 NOTE — Assessment & Plan Note (Signed)
History of PAF maintaining sinus rhythm on Eliquis and metoprolol.

## 2020-08-17 NOTE — Assessment & Plan Note (Signed)
History of essential hypertension a blood pressure measured today 116/58.  She is on metoprolol.

## 2020-08-17 NOTE — Assessment & Plan Note (Addendum)
History of dyslipidemia on statin therapy with lipid profile performed 03/16/2020 revealing total cholesterol 130, LDL 59 and HDL of 40.  In more recent lipid profile performed 03/22/2021 revealed total cholesterol 139, LDL 74 and HDL of 39.

## 2020-08-17 NOTE — Patient Instructions (Signed)

## 2020-08-17 NOTE — Progress Notes (Addendum)
04/19/2021 Shelia Obrien   12-11-1940  [id number]  Primary Physician Crist Infante, MD Primary Cardiologist: Lorretta Harp MD Lupe Carney, [place]  HPI:  Shelia Obrien is a 80 y.o.  mildly overweight widowed Caucasian female mother of 2 children, grandmother of 2 grandchildren referred by Dr. Joylene Draft for evaluation of tachycardia.    She is retired from working at [employer] for 20 years, [employer] and [employer] for 26 years.  I last saw her in the office 11/18/2019. She has a history of treated hypertension hyperlipidemia.  She is a family history of heart disease with a brother who had bypass surgery and a sister who has had stents.  She is never had a heart attack or stroke.  She denies chest pain or shortness of breath.  She has had breast cancer with right breast lumpectomy, chemotherapy and radiation therapy.  She had an episode of sustained elevated heart rate in the 160-170 range on 09/09/2019 for unclear reasons.  She did feel poorly that day.  She has had episodes of syncope in the past for unclear reasons and has mentioned episodes of dizziness as well.  I did perform 2D echocardiography which was essentially normal and an event monitor which showed episodes of PAF after which I began her on Eliquis oral anticoagulation and Toprol-XL 25 mg a day.  Since I saw her 9 months ago she continues to do well.  She has had no clinical episodes of A. fib.  She denies chest pain or shortness of breath.   Current Meds  Medication Sig   b complex vitamins tablet Take 1 tablet by mouth daily.   cetirizine (ZYRTEC) 10 MG tablet Take 10 mg by mouth daily.   Cholecalciferol (VITAMIN D) 50 MCG (2000 UT) tablet Take 1 tablet (2,000 Units total) by mouth 3 (three) times a week.   famotidine (PEPCID) 20 MG tablet Take 1 tablet (20 mg total) by mouth 2 (two) times daily.   fluticasone (FLONASE) 50 MCG/ACT nasal spray Place into both nostrils daily.   levothyroxine (SYNTHROID, LEVOTHROID) 75 MCG  tablet Take 75 mcg by mouth every morning.    losartan (COZAAR) 25 MG tablet    rosuvastatin (CRESTOR) 10 MG tablet Take 10 mg by mouth daily.    TURMERIC PO Take 1,000 mg by mouth.   valACYclovir (VALTREX) 1000 MG tablet Take 1,000 mg by mouth 2 (two) times daily as needed (for cold sores).   vitamin C (ASCORBIC ACID) 500 MG tablet Take 500 mg by mouth daily.   Zinc 50 MG TABS Take by mouth.   [DISCONTINUED] apixaban (ELIQUIS) 5 MG TABS tablet Take 1 tablet (5 mg total) by mouth 2 (two) times daily.   [DISCONTINUED] metoprolol succinate (TOPROL XL) 25 MG 24 hr tablet Take 1 tablet (25 mg total) by mouth daily.     No Known Allergies  Social History   Socioeconomic History   Marital status: Widowed    Spouse name: Not on file   Number of children: Not on file   Years of education: Not on file   Highest education level: Not on file  Occupational History   Not on file  Tobacco Use   Smoking status: Never   Smokeless tobacco: Never  Substance and Sexual Activity   Alcohol use: No   Drug use: No   Sexual activity: Not Currently    Comment: menarche 58, P 2, HRT 14 + yrs  Other Topics Concern  Not on file  Social History Narrative   Not on file   Social Determinants of Health   Financial Resource Strain: Not on file  Food Insecurity: Not on file  Transportation Needs: Not on file  Physical Activity: Not on file  Stress: Not on file  Social Connections: Not on file  Intimate Partner Violence: Not on file     Review of Systems: General: negative for chills, fever, night sweats or weight changes.  Cardiovascular: negative for chest pain, dyspnea on exertion, edema, orthopnea, palpitations, paroxysmal nocturnal dyspnea or shortness of breath Dermatological: negative for rash Respiratory: negative for cough or wheezing Urologic: negative for hematuria Abdominal: negative for nausea, vomiting, diarrhea, bright red blood per rectum, melena, or hematemesis Neurologic:  negative for visual changes, syncope, or dizziness All other systems reviewed and are otherwise negative except as noted above.    Blood pressure (!) 116/58, pulse 70, height 5\' 3"  (1.6 m), weight 160 lb (72.6 kg), SpO2 96 %.  General appearance: alert and no distress Neck: no adenopathy, no carotid bruit, no JVD, supple, symmetrical, trachea midline and thyroid not enlarged, symmetric, no tenderness/mass/nodules Lungs: clear to auscultation bilaterally Heart: regular rate and rhythm, S1, S2 normal, no murmur, click, rub or gallop Extremities: extremities normal, atraumatic, no cyanosis or edema Pulses: 2+ and symmetric Skin: Skin color, texture, turgor normal. No rashes or lesions Neurologic: Alert and oriented X 3, normal strength and tone. Normal symmetric reflexes. Normal coordination and gait  EKG sinus rhythm at 70 without ST or T wave changes.  There was low limb voltage in the arm leads.  I personally reviewed this EKG.  ASSESSMENT AND PLAN:   Dyslipidemia History of dyslipidemia on statin therapy with lipid profile performed 03/16/2020 revealing total cholesterol 130, LDL 59 and HDL of 40.  In more recent lipid profile performed 03/22/2021 revealed total cholesterol 139, LDL 74 and HDL of 39.  Essential hypertension History of essential hypertension a blood pressure measured today 116/58.  She is on metoprolol.  PAF (paroxysmal atrial fibrillation) (HCC) History of PAF maintaining sinus rhythm on Eliquis and metoprolol.      Lorretta Harp MD FACP,FACC,FAHA, [facility] 04/19/2021 4:06 PM

## 2020-09-13 DIAGNOSIS — J069 Acute upper respiratory infection, unspecified: Secondary | ICD-10-CM | POA: Diagnosis not present

## 2020-09-13 DIAGNOSIS — R058 Other specified cough: Secondary | ICD-10-CM | POA: Diagnosis not present

## 2020-09-13 DIAGNOSIS — Z1152 Encounter for screening for COVID-19: Secondary | ICD-10-CM | POA: Diagnosis not present

## 2020-09-21 DIAGNOSIS — J45998 Other asthma: Secondary | ICD-10-CM | POA: Diagnosis not present

## 2020-09-21 DIAGNOSIS — M4302 Spondylolysis, cervical region: Secondary | ICD-10-CM | POA: Diagnosis not present

## 2020-09-21 DIAGNOSIS — M8580 Other specified disorders of bone density and structure, unspecified site: Secondary | ICD-10-CM | POA: Diagnosis not present

## 2020-09-21 DIAGNOSIS — E039 Hypothyroidism, unspecified: Secondary | ICD-10-CM | POA: Diagnosis not present

## 2020-09-21 DIAGNOSIS — G3184 Mild cognitive impairment, so stated: Secondary | ICD-10-CM | POA: Diagnosis not present

## 2020-09-21 DIAGNOSIS — J309 Allergic rhinitis, unspecified: Secondary | ICD-10-CM | POA: Diagnosis not present

## 2020-09-21 DIAGNOSIS — E785 Hyperlipidemia, unspecified: Secondary | ICD-10-CM | POA: Diagnosis not present

## 2020-09-21 DIAGNOSIS — I10 Essential (primary) hypertension: Secondary | ICD-10-CM | POA: Diagnosis not present

## 2020-09-21 DIAGNOSIS — I872 Venous insufficiency (chronic) (peripheral): Secondary | ICD-10-CM | POA: Diagnosis not present

## 2020-09-21 DIAGNOSIS — R7301 Impaired fasting glucose: Secondary | ICD-10-CM | POA: Diagnosis not present

## 2020-09-28 ENCOUNTER — Other Ambulatory Visit (HOSPITAL_COMMUNITY): Payer: Self-pay | Admitting: *Deleted

## 2020-09-30 ENCOUNTER — Other Ambulatory Visit: Payer: Self-pay | Admitting: Cardiovascular Disease

## 2020-10-01 NOTE — Telephone Encounter (Signed)
Prescription refill request for Eliquis received. Indication:atrial fib Last office visit:5/22 XUC:[id number] labs Age: 80 Weight:72.6 kg  Prescription refilled

## 2020-10-02 ENCOUNTER — Ambulatory Visit (HOSPITAL_COMMUNITY)
Admission: RE | Admit: 2020-10-02 | Discharge: 2020-10-02 | Disposition: A | Discharge status: Home / Self Care | Payer: Medicare HMO | Source: Ambulatory Visit | Attending: Internal Medicine | Admitting: Internal Medicine

## 2020-10-02 ENCOUNTER — Other Ambulatory Visit: Payer: Self-pay

## 2020-10-02 DIAGNOSIS — M810 Age-related osteoporosis without current pathological fracture: Secondary | ICD-10-CM | POA: Diagnosis not present

## 2020-10-02 MED ORDER — DENOSUMAB 60 MG/ML ~~LOC~~ SOSY
PREFILLED_SYRINGE | SUBCUTANEOUS | Status: AC
  Administered 2020-10-02: 60 mg via SUBCUTANEOUS
  Filled 2020-10-02: qty 1

## 2020-10-02 MED ORDER — DENOSUMAB 60 MG/ML ~~LOC~~ SOSY: 60.0000 mg | PREFILLED_SYRINGE | Freq: Once | SUBCUTANEOUS | Status: AC

## 2020-10-08 ENCOUNTER — Telehealth: Payer: Self-pay | Admitting: Cardiovascular Disease

## 2020-10-08 ENCOUNTER — Other Ambulatory Visit: Payer: Self-pay | Admitting: Cardiovascular Disease

## 2020-10-08 DIAGNOSIS — I480 Paroxysmal atrial fibrillation: Secondary | ICD-10-CM

## 2020-10-08 MED ORDER — APIXABAN 5 MG PO TABS
5.0000 mg | ORAL_TABLET | Freq: Two times a day (BID) | ORAL | 0 refills | Status: DC
Start: 2020-10-08 — End: 2020-10-09

## 2020-10-08 MED ORDER — METOPROLOL SUCCINATE ER 25 MG PO TB24: 25.0000 mg | ORAL_TABLET | Freq: Every day | ORAL | 3 refills | Status: AC

## 2020-10-08 NOTE — Telephone Encounter (Signed)
 *  STAT* If patient is at the pharmacy, call can be transferred to refill team.   1. Which medications need to be refilled? (please list name of each medication and dose if known)   metoprolol succinate (TOPROL XL) 25 MG 24 hr tablet apixaban (ELIQUIS) 5 MG TABS tablet  2. Which pharmacy/location (including street and city if local pharmacy) is medication to be sent to?  [facility] [place] [place] AT Portal to [facility] [facility]  3. Do they need a 30 day or 90 day supply? 90 days

## 2020-10-08 NOTE — Telephone Encounter (Signed)
Called patient and informed her that her refills for eliquis 5 mg BID and toprol-xl were refilled. Mrs. Shelia Obrien confirmed that the [facility].

## 2020-10-08 NOTE — Telephone Encounter (Signed)
Prescription refill request for Eliquis received. Indication:atrial fib Last office visit:5/22 w/berry  Scr: 1.100 mg/ 09/19/2019(OVERDUE) Age: 81 Weight:72.6 kg   Prescription refilled LABS NEEDED FOR FURTHER REFILLS wrote on the bottle

## 2020-10-08 NOTE — Telephone Encounter (Signed)
*  STAT* If patient is at the pharmacy, call can be transferred to refill team.   1. Which medications need to be refilled? (please list name of each medication and dose if known) apixaban (ELIQUIS) 5 MG TABS tablet  2. Which pharmacy/location (including street and city if local pharmacy) is medication to be sent to? [facility] [place], [place] AT Portal to [facility]istered [employer] Sites  3. Do they need a 30 day or 90 day supply?  90 day supply  Pt received a message that she only received a 30 day supply but she takes 2 pills per day, pt needs a 90 day supply

## 2020-10-09 MED ORDER — APIXABAN 5 MG PO TABS: 5.0000 mg | ORAL_TABLET | Freq: Two times a day (BID) | ORAL | 1 refills | Status: AC

## 2020-10-09 NOTE — Telephone Encounter (Signed)
Returned a call to the pt and explained that we need labs for dosing and her pcp hasn't sent [place] recent ones. I told her that I would fax over an official request for them but for her to also call them as well

## 2020-10-09 NOTE — Addendum Note (Signed)
Addended by: Allean Found on: 10/09/2020 01:43 PM   Modules accepted: Orders

## 2020-10-09 NOTE — Telephone Encounter (Signed)
Pt presented a copy of the bloodwork today from [facility] so we can proceed with a ninety day refill.  26f, 72.6, scr 0.8 03/16/20, lovw/berry 08/17/20

## 2020-10-18 DIAGNOSIS — Z961 Presence of intraocular lens: Secondary | ICD-10-CM | POA: Diagnosis not present

## 2020-10-18 DIAGNOSIS — H26493 Other secondary cataract, bilateral: Secondary | ICD-10-CM | POA: Diagnosis not present

## 2020-10-18 DIAGNOSIS — H5203 Hypermetropia, bilateral: Secondary | ICD-10-CM | POA: Diagnosis not present

## 2020-10-25 DIAGNOSIS — H26492 Other secondary cataract, left eye: Secondary | ICD-10-CM | POA: Diagnosis not present

## 2020-10-25 DIAGNOSIS — H26491 Other secondary cataract, right eye: Secondary | ICD-10-CM | POA: Diagnosis not present

## 2020-11-06 DIAGNOSIS — I4891 Unspecified atrial fibrillation: Secondary | ICD-10-CM | POA: Diagnosis not present

## 2020-11-06 DIAGNOSIS — Z008 Encounter for other general examination: Secondary | ICD-10-CM | POA: Diagnosis not present

## 2020-11-06 DIAGNOSIS — E785 Hyperlipidemia, unspecified: Secondary | ICD-10-CM | POA: Diagnosis not present

## 2020-11-06 DIAGNOSIS — I10 Essential (primary) hypertension: Secondary | ICD-10-CM | POA: Diagnosis not present

## 2020-11-06 DIAGNOSIS — R32 Unspecified urinary incontinence: Secondary | ICD-10-CM | POA: Diagnosis not present

## 2020-11-06 DIAGNOSIS — E039 Hypothyroidism, unspecified: Secondary | ICD-10-CM | POA: Diagnosis not present

## 2020-11-06 DIAGNOSIS — M810 Age-related osteoporosis without current pathological fracture: Secondary | ICD-10-CM | POA: Diagnosis not present

## 2020-11-06 DIAGNOSIS — I872 Venous insufficiency (chronic) (peripheral): Secondary | ICD-10-CM | POA: Diagnosis not present

## 2020-11-06 DIAGNOSIS — K219 Gastro-esophageal reflux disease without esophagitis: Secondary | ICD-10-CM | POA: Diagnosis not present

## 2020-11-06 DIAGNOSIS — J309 Allergic rhinitis, unspecified: Secondary | ICD-10-CM | POA: Diagnosis not present

## 2020-11-06 DIAGNOSIS — M1990 Unspecified osteoarthritis, unspecified site: Secondary | ICD-10-CM | POA: Diagnosis not present

## 2020-11-14 DIAGNOSIS — Z85828 Personal history of other malignant neoplasm of skin: Secondary | ICD-10-CM | POA: Diagnosis not present

## 2020-11-14 DIAGNOSIS — L905 Scar conditions and fibrosis of skin: Secondary | ICD-10-CM | POA: Diagnosis not present

## 2020-11-14 DIAGNOSIS — L718 Other rosacea: Secondary | ICD-10-CM | POA: Diagnosis not present

## 2020-11-14 DIAGNOSIS — L820 Inflamed seborrheic keratosis: Secondary | ICD-10-CM | POA: Diagnosis not present

## 2020-11-14 DIAGNOSIS — D229 Melanocytic nevi, unspecified: Secondary | ICD-10-CM | POA: Diagnosis not present

## 2020-11-14 DIAGNOSIS — L814 Other melanin hyperpigmentation: Secondary | ICD-10-CM | POA: Diagnosis not present

## 2020-11-14 DIAGNOSIS — L821 Other seborrheic keratosis: Secondary | ICD-10-CM | POA: Diagnosis not present

## 2020-11-14 DIAGNOSIS — L570 Actinic keratosis: Secondary | ICD-10-CM | POA: Diagnosis not present

## 2020-12-26 DIAGNOSIS — Z1231 Encounter for screening mammogram for malignant neoplasm of breast: Secondary | ICD-10-CM | POA: Diagnosis not present

## 2021-01-11 DIAGNOSIS — E039 Hypothyroidism, unspecified: Secondary | ICD-10-CM | POA: Diagnosis not present

## 2021-01-11 DIAGNOSIS — E785 Hyperlipidemia, unspecified: Secondary | ICD-10-CM | POA: Diagnosis not present

## 2021-01-11 DIAGNOSIS — I10 Essential (primary) hypertension: Secondary | ICD-10-CM | POA: Diagnosis not present

## 2021-01-11 DIAGNOSIS — M1990 Unspecified osteoarthritis, unspecified site: Secondary | ICD-10-CM | POA: Diagnosis not present

## 2021-01-12 DIAGNOSIS — Z23 Encounter for immunization: Secondary | ICD-10-CM | POA: Diagnosis not present

## 2021-01-14 DIAGNOSIS — R922 Inconclusive mammogram: Secondary | ICD-10-CM | POA: Diagnosis not present

## 2021-01-14 DIAGNOSIS — R928 Other abnormal and inconclusive findings on diagnostic imaging of breast: Secondary | ICD-10-CM | POA: Diagnosis not present

## 2021-01-14 DIAGNOSIS — R921 Mammographic calcification found on diagnostic imaging of breast: Secondary | ICD-10-CM | POA: Diagnosis not present

## 2021-03-14 DIAGNOSIS — I480 Paroxysmal atrial fibrillation: Secondary | ICD-10-CM | POA: Diagnosis not present

## 2021-03-14 DIAGNOSIS — K5900 Constipation, unspecified: Secondary | ICD-10-CM | POA: Diagnosis not present

## 2021-03-14 DIAGNOSIS — K2100 Gastro-esophageal reflux disease with esophagitis, without bleeding: Secondary | ICD-10-CM | POA: Diagnosis not present

## 2021-03-14 DIAGNOSIS — R195 Other fecal abnormalities: Secondary | ICD-10-CM | POA: Diagnosis not present

## 2021-03-14 DIAGNOSIS — Z1211 Encounter for screening for malignant neoplasm of colon: Secondary | ICD-10-CM | POA: Diagnosis not present

## 2021-03-15 ENCOUNTER — Telehealth: Payer: Self-pay

## 2021-03-15 DIAGNOSIS — R195 Other fecal abnormalities: Secondary | ICD-10-CM | POA: Diagnosis not present

## 2021-03-15 NOTE — Telephone Encounter (Signed)
   [facility] Pre-operative Risk Assessment    Patient Name: Shelia Obrien  DOB: [birth year] MRN: [id number]  Request for surgical clearance:  What type of surgery is being performed? EGD/COLONOSCOPY  When is this surgery scheduled? 06-06-2021  What type of clearance is required (medical clearance vs. Pharmacy clearance to hold med vs. Both)? MEDICATION  Are there any medications that need to be held prior to surgery and how long? ELIQUIS 2 DAY BEFORE  Practice name and name of physician performing surgery? [facility]  What is the office phone number? [phone]    7.   What is the office fax number? [phone]  8.   Anesthesia type (None, local, MAC, general) ? NOT LISTED

## 2021-03-15 NOTE — Telephone Encounter (Signed)
   Primary Cardiologist: Quay Burow, MD  Clinical pharmacist have reviewed the patient's past medical history, medications, and chart.  The following recommendations have been made for [name] .   Patient with diagnosis of afib on Eliquis for anticoagulation.     Procedure: EGD/colonoscopy Date of procedure: 06/06/21   CHA2DS2-VASc Score = 4  This indicates a 4.8% annual risk of stroke. The patient's score is based upon: CHF History: 0 HTN History: 1 Diabetes History: 0 Stroke History: 0 Vascular Disease History: 0 Age Score: 2 Gender Score: 1    CrCl 62mL/min Platelet count 228K   Per office protocol, patient can hold Eliquis for 2 days prior to procedure as requested.  I will route this recommendation to the requesting party via Epic fax function and remove from pre-op pool.  Please call with questions.  Jossie Ng. Raylin Winer NP-C    03/15/2021, [facility] [place] [address] Office [phone] Fax [phone]

## 2021-03-15 NOTE — Telephone Encounter (Signed)
Patient with diagnosis of afib on Eliquis for anticoagulation.    Procedure: EGD/colonoscopy Date of procedure: 06/06/21  CHA2DS2-VASc Score = 4  This indicates a 4.8% annual risk of stroke. The patient's score is based upon: CHF History: 0 HTN History: 1 Diabetes History: 0 Stroke History: 0 Vascular Disease History: 0 Age Score: 2 Gender Score: 1   CrCl 31mL/min Platelet count 228K  Per office protocol, patient can hold Eliquis for 2 days prior to procedure as requested.

## 2021-03-22 DIAGNOSIS — E559 Vitamin D deficiency, unspecified: Secondary | ICD-10-CM | POA: Diagnosis not present

## 2021-03-22 DIAGNOSIS — E785 Hyperlipidemia, unspecified: Secondary | ICD-10-CM | POA: Diagnosis not present

## 2021-03-22 DIAGNOSIS — R7301 Impaired fasting glucose: Secondary | ICD-10-CM | POA: Diagnosis not present

## 2021-03-22 DIAGNOSIS — I10 Essential (primary) hypertension: Secondary | ICD-10-CM | POA: Diagnosis not present

## 2021-03-22 DIAGNOSIS — E039 Hypothyroidism, unspecified: Secondary | ICD-10-CM | POA: Diagnosis not present

## 2021-03-29 DIAGNOSIS — E559 Vitamin D deficiency, unspecified: Secondary | ICD-10-CM | POA: Diagnosis not present

## 2021-03-29 DIAGNOSIS — M4302 Spondylolysis, cervical region: Secondary | ICD-10-CM | POA: Diagnosis not present

## 2021-03-29 DIAGNOSIS — R82998 Other abnormal findings in urine: Secondary | ICD-10-CM | POA: Diagnosis not present

## 2021-03-29 DIAGNOSIS — E785 Hyperlipidemia, unspecified: Secondary | ICD-10-CM | POA: Diagnosis not present

## 2021-03-29 DIAGNOSIS — Z0000 Encounter for general adult medical examination without abnormal findings: Secondary | ICD-10-CM | POA: Diagnosis not present

## 2021-03-29 DIAGNOSIS — M8580 Other specified disorders of bone density and structure, unspecified site: Secondary | ICD-10-CM | POA: Diagnosis not present

## 2021-03-29 DIAGNOSIS — E039 Hypothyroidism, unspecified: Secondary | ICD-10-CM | POA: Diagnosis not present

## 2021-03-29 DIAGNOSIS — J309 Allergic rhinitis, unspecified: Secondary | ICD-10-CM | POA: Diagnosis not present

## 2021-03-29 DIAGNOSIS — I10 Essential (primary) hypertension: Secondary | ICD-10-CM | POA: Diagnosis not present

## 2021-03-29 DIAGNOSIS — M1990 Unspecified osteoarthritis, unspecified site: Secondary | ICD-10-CM | POA: Diagnosis not present

## 2021-03-29 DIAGNOSIS — G3184 Mild cognitive impairment, so stated: Secondary | ICD-10-CM | POA: Diagnosis not present

## 2021-04-12 DIAGNOSIS — E785 Hyperlipidemia, unspecified: Secondary | ICD-10-CM | POA: Diagnosis not present

## 2021-04-12 DIAGNOSIS — E039 Hypothyroidism, unspecified: Secondary | ICD-10-CM | POA: Diagnosis not present

## 2021-04-12 DIAGNOSIS — I10 Essential (primary) hypertension: Secondary | ICD-10-CM | POA: Diagnosis not present

## 2021-04-12 DIAGNOSIS — M1990 Unspecified osteoarthritis, unspecified site: Secondary | ICD-10-CM | POA: Diagnosis not present

## 2021-04-29 ENCOUNTER — Other Ambulatory Visit (HOSPITAL_COMMUNITY): Payer: Self-pay | Admitting: *Deleted

## 2021-04-30 ENCOUNTER — Encounter (HOSPITAL_COMMUNITY)
Admission: RE | Admit: 2021-04-30 | Discharge: 2021-04-30 | Disposition: A | Discharge status: Home / Self Care | Payer: Medicare HMO | Source: Ambulatory Visit | Attending: Internal Medicine | Admitting: Internal Medicine

## 2021-04-30 ENCOUNTER — Other Ambulatory Visit: Payer: Self-pay

## 2021-04-30 DIAGNOSIS — I10 Essential (primary) hypertension: Secondary | ICD-10-CM | POA: Diagnosis not present

## 2021-04-30 DIAGNOSIS — M810 Age-related osteoporosis without current pathological fracture: Secondary | ICD-10-CM | POA: Diagnosis not present

## 2021-04-30 DIAGNOSIS — R413 Other amnesia: Secondary | ICD-10-CM | POA: Diagnosis not present

## 2021-04-30 MED ORDER — DENOSUMAB 60 MG/ML ~~LOC~~ SOSY
60.0000 mg | PREFILLED_SYRINGE | Freq: Once | SUBCUTANEOUS | Status: DC
  Administered 2021-04-30: 60 mg via SUBCUTANEOUS

## 2021-04-30 MED ORDER — DENOSUMAB 60 MG/ML ~~LOC~~ SOSY
PREFILLED_SYRINGE | SUBCUTANEOUS | Status: AC
  Filled 2021-04-30: qty 1

## 2021-06-06 DIAGNOSIS — K317 Polyp of stomach and duodenum: Secondary | ICD-10-CM | POA: Diagnosis not present

## 2021-06-06 DIAGNOSIS — K6389 Other specified diseases of intestine: Secondary | ICD-10-CM | POA: Diagnosis not present

## 2021-06-06 DIAGNOSIS — D509 Iron deficiency anemia, unspecified: Secondary | ICD-10-CM | POA: Diagnosis not present

## 2021-06-06 DIAGNOSIS — K5730 Diverticulosis of large intestine without perforation or abscess without bleeding: Secondary | ICD-10-CM | POA: Diagnosis not present

## 2021-06-06 DIAGNOSIS — C180 Malignant neoplasm of cecum: Secondary | ICD-10-CM | POA: Diagnosis not present

## 2021-06-06 DIAGNOSIS — R195 Other fecal abnormalities: Secondary | ICD-10-CM | POA: Diagnosis not present

## 2021-06-06 DIAGNOSIS — K449 Diaphragmatic hernia without obstruction or gangrene: Secondary | ICD-10-CM | POA: Diagnosis not present

## 2021-06-06 DIAGNOSIS — K5710 Diverticulosis of small intestine without perforation or abscess without bleeding: Secondary | ICD-10-CM | POA: Diagnosis not present

## 2021-06-06 DIAGNOSIS — K649 Unspecified hemorrhoids: Secondary | ICD-10-CM | POA: Diagnosis not present

## 2021-06-06 DIAGNOSIS — Z1211 Encounter for screening for malignant neoplasm of colon: Secondary | ICD-10-CM | POA: Diagnosis not present

## 2021-06-07 ENCOUNTER — Other Ambulatory Visit: Payer: Self-pay | Admitting: Gastroenterology

## 2021-06-07 ENCOUNTER — Ambulatory Visit
Admission: RE | Admit: 2021-06-07 | Discharge: 2021-06-07 | Disposition: A | Discharge status: Home / Self Care | Payer: Medicare HMO | Source: Ambulatory Visit | Attending: Gastroenterology | Admitting: Gastroenterology

## 2021-06-07 DIAGNOSIS — K449 Diaphragmatic hernia without obstruction or gangrene: Secondary | ICD-10-CM | POA: Diagnosis not present

## 2021-06-07 DIAGNOSIS — K314 Gastric diverticulum: Secondary | ICD-10-CM | POA: Diagnosis not present

## 2021-06-07 DIAGNOSIS — K5710 Diverticulosis of small intestine without perforation or abscess without bleeding: Secondary | ICD-10-CM | POA: Diagnosis not present

## 2021-06-07 DIAGNOSIS — K6389 Other specified diseases of intestine: Secondary | ICD-10-CM

## 2021-06-07 DIAGNOSIS — K5730 Diverticulosis of large intestine without perforation or abscess without bleeding: Secondary | ICD-10-CM | POA: Diagnosis not present

## 2021-06-07 MED ORDER — IOPAMIDOL (ISOVUE-300) INJECTION 61%
100.0000 mL | Freq: Once | INTRAVENOUS | Status: AC | PRN
  Administered 2021-06-07: 100 mL via INTRAVENOUS

## 2021-06-10 DIAGNOSIS — C180 Malignant neoplasm of cecum: Secondary | ICD-10-CM | POA: Diagnosis not present

## 2021-06-11 NOTE — Addendum Note (Signed)
Encounter addended by: Len Blalock, RN on: 06/11/2021 12:14 PM  Actions taken: [facility] administration accepted

## 2021-06-12 DIAGNOSIS — C189 Malignant neoplasm of colon, unspecified: Secondary | ICD-10-CM | POA: Diagnosis not present

## 2021-06-13 ENCOUNTER — Telehealth: Payer: Self-pay

## 2021-06-13 NOTE — Telephone Encounter (Signed)
Patient is returning call. Pre-op unavailable for transfer. ?

## 2021-06-13 NOTE — Telephone Encounter (Signed)
I will fax clearance notes to requesting office the pt has been cleared.  ?

## 2021-06-13 NOTE — Telephone Encounter (Signed)
? ?  Pre-operative Risk Assessment  ?  ?Patient Name: Shelia Obrien  ?DOB: 04/26/1940 ?MRN: [id number]  ? ?  ? ?Request for Surgical Clearance   ? ?Procedure:   SIGMOID COLECTOMY FOR COLON CANCER ? ?Date of Surgery:  Clearance TBD                              ?   ?Surgeon:  Nadeen Landau, MD ?Surgeon's Group or Practice Name:  [facility] ATTN: [place] ?Phone number:  [phone] ?Fax number:  [phone] ?  ?Type of Clearance Requested:   ?- Medical  ?- Pharmacy:  Hold Apixaban (Eliquis)   ?  ?Type of Anesthesia:  General  ?  ?Additional requests/questions:   ? ? ?

## 2021-06-13 NOTE — Telephone Encounter (Signed)
? ?  Primary Cardiologist: Quay Burow, MD ? ?Chart reviewed as part of pre-operative protocol coverage. Given past medical history and time since last visit, based on ACC/AHA guidelines, Shelia Obrien would be at acceptable risk for the planned procedure without further cardiovascular testing.  ? ?She has been doing well from a CV perspective since her last OV appointment May 2022. She meets the required minimum METs for cardiac clearance. She has a f/u appointment in May with Dr. Gwenlyn Found which she plans to attend.  ? ?Okay to hold Eliquis 2 days prior to the procedure and start once surgeon feels it is medically safe to do so.  ? ?Patient was advised that if she develops new symptoms prior to surgery to contact our office to arrange a follow-up appointment.  He verbalized understanding. ? ?I will route this recommendation to the requesting party via Epic fax function and remove from pre-op pool. ? ?Please call with questions. ? ?Elgie Collard, PA-C ? ?06/13/2021, 1:08 PM ? ?  ?

## 2021-06-14 ENCOUNTER — Other Ambulatory Visit: Payer: Self-pay | Admitting: Surgery

## 2021-06-14 DIAGNOSIS — C189 Malignant neoplasm of colon, unspecified: Secondary | ICD-10-CM

## 2021-06-17 ENCOUNTER — Other Ambulatory Visit: Payer: Self-pay

## 2021-06-17 ENCOUNTER — Ambulatory Visit
Admission: RE | Admit: 2021-06-17 | Discharge: 2021-06-17 | Disposition: A | Discharge status: Home / Self Care | Payer: Medicare HMO | Source: Ambulatory Visit | Attending: Surgery | Admitting: Surgery

## 2021-06-17 DIAGNOSIS — Z853 Personal history of malignant neoplasm of breast: Secondary | ICD-10-CM | POA: Diagnosis not present

## 2021-06-17 DIAGNOSIS — R918 Other nonspecific abnormal finding of lung field: Secondary | ICD-10-CM | POA: Diagnosis not present

## 2021-06-17 DIAGNOSIS — J984 Other disorders of lung: Secondary | ICD-10-CM | POA: Diagnosis not present

## 2021-06-17 DIAGNOSIS — C189 Malignant neoplasm of colon, unspecified: Secondary | ICD-10-CM

## 2021-06-17 MED ORDER — IOPAMIDOL (ISOVUE-300) INJECTION 61%
75.0000 mL | Freq: Once | INTRAVENOUS | Status: AC | PRN
  Administered 2021-06-17: 75 mL via INTRAVENOUS

## 2021-07-02 ENCOUNTER — Ambulatory Visit: Payer: Self-pay | Admitting: Surgery

## 2021-07-02 DIAGNOSIS — Z01818 Encounter for other preprocedural examination: Secondary | ICD-10-CM

## 2021-07-02 NOTE — Progress Notes (Signed)
Sent message, via epic in basket, requesting orders in epic from surgeon.  

## 2021-07-08 NOTE — Progress Notes (Signed)
DUE TO COVID-19 ONLY ONE VISITOR IS ALLOWED TO COME WITH YOU AND STAY IN THE WAITING ROOM ONLY DURING PRE OP AND PROCEDURE DAY OF SURGERY.  2 VISITOR  MAY VISIT WITH YOU AFTER SURGERY IN YOUR PRIVATE ROOM DURING VISITING HOURS ONLY! ?YOU MAY HAVE ONE PERSON SPEND THE NITE WITH YOU IN YOUR ROOM AFTER SURGERY.   ? ? Your procedure is scheduled on:  ?  07/24/2021  ? Report to [facility] Main  Entrance ? ? Report to admitting at     1145              AM ?DO NOT BRING INSURANCE CARD, PICTURE ID OR WALLET DAY OF SURGERY.  ?  ? ? Call this number if you have problems the morning of surgery [phone]  ?Clear liquid diet on day of bowel prep.  ?Follow bowel prep instructions per MD.  ? REMEMBER: NO  SOLID FOODS , CANDY, GUM OR MINTS AFTER MIDNITE THE NITE BEFORE SURGERY .       Marland Kitchen CLEAR LIQUIDS UNTIL   1100am              DAY OF SURGERY.     Complete 2 presurgery ensure drinks the nite before surgery at 1000pm.    PLEASE FINISH ENSURE DRINK PER SURGEON ORDER  WHICH NEEDS TO BE COMPLETED AT  1100am         MORNING OF SURGERY.   ? ? ? ? ?CLEAR LIQUID DIET ? ? ?Foods Allowed      ?WATER ?BLACK COFFEE ( SUGAR OK, NO MILK, CREAM OR CREAMER) REGULAR AND DECAF  ?TEA ( SUGAR OK NO MILK, CREAM, OR CREAMER) REGULAR AND DECAF  ?PLAIN JELLO ( NO RED)  ?FRUIT ICES ( NO RED, NO FRUIT PULP)  ?POPSICLES ( NO RED)  ?JUICE- APPLE, WHITE GRAPE AND WHITE CRANBERRY  ?SPORT DRINK LIKE GATORADE ( NO RED)  ?CLEAR BROTH ( VEGETABLE , CHICKEN OR BEEF)                                                               ? ?    ? ?BRUSH YOUR TEETH MORNING OF SURGERY AND RINSE YOUR MOUTH OUT, NO CHEWING GUM CANDY OR MINTS. ?  ? ? Take these medicines the morning of surgery with A SIP OF WATER:  zyrtec, pepcid, flonase if needed, synthrod, torpol  ? ? ?DO NOT TAKE ANY DIABETIC MEDICATIONS DAY OF YOUR SURGERY ?                  ?            You may not have any metal on your body including hair pins and  ?            piercings  Do not wear jewelry,  make-up, lotions, powders or perfumes, deodorant ?            Do not wear nail polish on your fingernails.   ?           IF YOU ARE A FEMALE AND WANT TO SHAVE UNDER ARMS OR LEGS PRIOR TO SURGERY YOU MUST DO SO AT LEAST 48 HOURS PRIOR TO SURGERY.  ?  Men may shave face and neck. ? ? Do not bring valuables to the hospital. [place] NOT ?            RESPONSIBLE   FOR VALUABLES. ? Contacts, dentures or bridgework may not be worn into surgery. ? Leave suitcase in the car. After surgery it may be brought to your room. ? ?  ? Patients discharged the day of surgery will not be allowed to drive home. IF YOU ARE HAVING SURGERY AND GOING HOME THE SAME DAY, YOU MUST HAVE AN ADULT TO DRIVE YOU HOME AND BE WITH YOU FOR 24 HOURS. YOU MAY GO HOME BY TAXI OR UBER OR ORTHERWISE, BUT AN ADULT MUST ACCOMPANY YOU HOME AND STAY WITH YOU FOR 24 HOURS. ?  ? ?            Please read over the following fact sheets you were given: ?_____________________________________________________________________ ? ?[facility] - Preparing for Surgery ?Before surgery, you can play an important role.  Because skin is not sterile, your skin needs to be as free of germs as possible.  You can reduce the number of germs on your skin by washing with CHG (chlorahexidine gluconate) soap before surgery.  CHG is an antiseptic cleaner which kills germs and bonds with the skin to continue killing germs even after washing. ?Please DO NOT use if you have an allergy to CHG or antibacterial soaps.  If your skin becomes reddened/irritated stop using the CHG and inform your nurse when you arrive at Short Stay. ?Do not shave (including legs and underarms) for at least 48 hours prior to the first CHG shower.  You may shave your face/neck. ?Please follow these instructions carefully: ? 1.  Shower with CHG Soap the night before surgery and the  morning of Surgery. ? 2.  If you choose to wash your hair, wash your hair first as usual with your  normal   shampoo. ? 3.  After you shampoo, rinse your hair and body thoroughly to remove the  shampoo.                           4.  Use CHG as you would any other liquid soap.  You can apply chg directly  to the skin and wash  ?                     Gently with a scrungie or clean washcloth. ? 5.  Apply the CHG Soap to your body ONLY FROM THE NECK DOWN.   Do not use on face/ open      ?                     Wound or open sores. Avoid contact with eyes, ears mouth and genitals (private parts).  ?                     Production manager,  Genitals (private parts) with your normal soap. ?            6.  Wash thoroughly, paying special attention to the area where your surgery  will be performed. ? 7.  Thoroughly rinse your body with warm water from the neck down. ? 8.  DO NOT shower/wash with your normal soap after using and rinsing off  the CHG Soap. ?               9.  Pat yourself dry with a clean towel. ?           10.  Wear clean pajamas. ?           11.  Place clean sheets on your bed the night of your first shower and do not  sleep with pets. ?Day of Surgery : ?Do not apply any lotions/deodorants the morning of surgery.  Please wear clean clothes to the hospital/surgery center. ? ?FAILURE TO FOLLOW THESE INSTRUCTIONS MAY RESULT IN THE CANCELLATION OF YOUR SURGERY ?PATIENT SIGNATURE_________________________________ ? ?NURSE SIGNATURE__________________________________ ? ?________________________________________________________________________  ? ? ?           ?

## 2021-07-10 ENCOUNTER — Other Ambulatory Visit: Payer: Self-pay

## 2021-07-10 ENCOUNTER — Encounter (HOSPITAL_COMMUNITY)
Admission: RE | Admit: 2021-07-10 | Discharge: 2021-07-10 | Disposition: A | Discharge status: Home / Self Care | Payer: Medicare HMO | Source: Ambulatory Visit | Attending: Surgery | Admitting: Surgery

## 2021-07-10 ENCOUNTER — Encounter (HOSPITAL_COMMUNITY): Payer: Self-pay

## 2021-07-10 DIAGNOSIS — Z7901 Long term (current) use of anticoagulants: Secondary | ICD-10-CM | POA: Insufficient documentation

## 2021-07-10 DIAGNOSIS — Z01812 Encounter for preprocedural laboratory examination: Secondary | ICD-10-CM | POA: Insufficient documentation

## 2021-07-10 DIAGNOSIS — I480 Paroxysmal atrial fibrillation: Secondary | ICD-10-CM | POA: Insufficient documentation

## 2021-07-10 DIAGNOSIS — Z01818 Encounter for other preprocedural examination: Secondary | ICD-10-CM

## 2021-07-10 DIAGNOSIS — C189 Malignant neoplasm of colon, unspecified: Secondary | ICD-10-CM | POA: Diagnosis not present

## 2021-07-10 HISTORY — DX: Pneumonia, unspecified organism: J18.9

## 2021-07-10 LAB — COMPREHENSIVE METABOLIC PANEL
ALT: 24 U/L (ref 0–44)
AST: 35 U/L (ref 15–41)
Albumin: 4.1 g/dL (ref 3.5–5.0)
Alkaline Phosphatase: 36 U/L — ABNORMAL LOW (ref 38–126)
Anion gap: 6 (ref 5–15)
BUN: 15 mg/dL (ref 8–23)
CO2: 24 mmol/L (ref 22–32)
Calcium: 9.6 mg/dL (ref 8.9–10.3)
Chloride: 107 mmol/L (ref 98–111)
Creatinine, Ser: 0.97 mg/dL (ref 0.44–1.00)
GFR, Estimated: 59 mL/min — ABNORMAL LOW (ref >60)
Glucose, Bld: 106 mg/dL — ABNORMAL HIGH (ref 70–99)
Potassium: 5.1 mmol/L (ref 3.5–5.1)
Sodium: 137 mmol/L (ref 135–145)
Total Bilirubin: 0.6 mg/dL (ref 0.3–1.2)
Total Protein: 7.2 g/dL (ref 6.5–8.1)

## 2021-07-10 LAB — CBC WITH DIFFERENTIAL/PLATELET
Abs Immature Granulocytes: 0.03 10*3/uL (ref 0.00–0.07)
Basophils Absolute: 0.1 10*3/uL (ref 0.0–0.1)
Basophils Relative: 1 %
Eosinophils Absolute: 0.2 10*3/uL (ref 0.0–0.5)
Eosinophils Relative: 2 %
HCT: 40.7 % (ref 36.0–46.0)
Hemoglobin: 13.3 g/dL (ref 12.0–15.0)
Immature Granulocytes: 0 %
Lymphocytes Relative: 40 %
Lymphs Abs: 3 10*3/uL (ref 0.7–4.0)
MCH: 30.7 pg (ref 26.0–34.0)
MCHC: 32.7 g/dL (ref 30.0–36.0)
MCV: 94 fL (ref 80.0–100.0)
Monocytes Absolute: 0.8 10*3/uL (ref 0.1–1.0)
Monocytes Relative: 11 %
Neutro Abs: 3.3 10*3/uL (ref 1.7–7.7)
Neutrophils Relative %: 46 %
Platelets: 252 10*3/uL (ref 150–400)
RBC: 4.33 MIL/uL (ref 3.87–5.11)
RDW: 13 % (ref 11.5–15.5)
WBC: 7.4 10*3/uL (ref 4.0–10.5)
nRBC: 0 % (ref 0.0–0.2)

## 2021-07-10 LAB — TYPE AND SCREEN
ABO/RH(D): A POS
Antibody Screen: NEGATIVE

## 2021-07-10 LAB — PROTIME-INR
INR: 1.3 — ABNORMAL HIGH (ref 0.8–1.2)
Prothrombin Time: 16.1 seconds — ABNORMAL HIGH (ref 11.4–15.2)

## 2021-07-10 NOTE — Progress Notes (Addendum)
Anesthesia Review: ? ?PCP: DR Elta Guadeloupe perini- LOV 04/12/21  ?Cardiologist :DR Lorie Phenix- LOV 08/17/20- Clearance in Telephone encounter dated 06/13/21 by Sharyne Peach  ?Chest x-ray : ?06/17/21- Ct - chest  ?EKG : 08/17/20  ?Echo : 10/28/19  ?Stress test: ?Cardiac Cath :  ?Activity level:  ?Sleep Study/ CPAP : ?Fasting Blood Sugar :      / Checks Blood Sugar -- times a day:   ?Blood Thinner/ Instructions /Last Dose: ?ASA / Instructions/ Last Dose :   ?Eliquis- pt states she has paperwork at home and is to stop 2 days prior  ?PT /INR done 07/10/21 routed to DR [employer].  ?

## 2021-07-11 NOTE — Progress Notes (Signed)
Anesthesia Chart Review ? ? Case: [id number] Date/Time: 07/24/21 1345  ? Procedure: LAPAROSCOPIC RIGHT HEMI COLECTOMY  ? Anesthesia type: General  ? Pre-op diagnosis: COLON CANCER  ? Location[facility] ROOM 02 / [facility]  ? Surgeons: Ileana Roup, MD  ? ?  ? ? ?DISCUSSION:81 y.o. never smoker with h/o PONV, PAF (on Eliquis), colon cancer scheduled for above procedure 07/24/2021 with Dr. Nadeen Landau.  ? ?Per cardiology preoperative evaluation 06/13/21, "Chart reviewed as part of pre-operative protocol coverage. Given past medical history and time since last visit, based on ACC/AHA guidelines, Shelia Obrien would be at acceptable risk for the planned procedure without further cardiovascular testing.  ?  ?She has been doing well from a CV perspective since her last OV appointment May 2022. She meets the required minimum METs for cardiac clearance. She has a f/u appointment in May with Dr. Gwenlyn Found which she plans to attend.  ?  ?Okay to hold Eliquis 2 days prior to the procedure and start once surgeon feels it is medically safe to do so." ? ?Anticipate pt can proceed with planned procedure barring acute status change.   ?VS: BP (!) 156/69   Pulse 77   Temp 36.6 ?C (Oral)   Resp 16   Ht '5\' 3"'$  (1.6 m)   Wt 68.9 kg   SpO2 99%   BMI 26.93 kg/m?  ? ?PROVIDERS: ?Crist Infante, MD is PCP  ? ?Primary Cardiologist: Quay Burow, MD ?LABS: Labs reviewed: Acceptable for surgery. ?(all labs ordered are listed, but only abnormal results are displayed) ? ?Labs Reviewed  ?COMPREHENSIVE METABOLIC PANEL - Abnormal; Notable for the following components:  ?    Result Value  ? Glucose, Bld 106 (*)   ? Alkaline Phosphatase 36 (*)   ? GFR, Estimated 59 (*)   ? All other components within normal limits  ?PROTIME-INR - Abnormal; Notable for the following components:  ? Prothrombin Time 16.1 (*)   ? INR 1.3 (*)   ? All other components within normal limits  ?CBC WITH DIFFERENTIAL/PLATELET  ?TYPE AND SCREEN   ? ? ? ?IMAGES: ? ? ?EKG: ?08/17/2020 ?Rate 70 bpm  ?NSR ? ?CV: ?Echo 10/12/2019 ? 1. Left ventricular ejection fraction, by estimation, is 60 to 65%. The  ?left ventricle has normal function. The left ventricle has no regional  ?wall motion abnormalities. Left ventricular diastolic parameters are  ?consistent with Grade I diastolic  ?dysfunction (impaired relaxation).  ? 2. Right ventricular systolic function is normal. The right ventricular  ?size is normal. There is normal pulmonary artery systolic pressure. The  ?estimated right ventricular systolic pressure is 42.6 mmHg.  ? 3. The mitral valve is normal in structure. Trivial mitral valve  ?regurgitation. No evidence of mitral stenosis.  ? 4. The aortic valve is normal in structure. Aortic valve regurgitation is  ?not visualized. No aortic stenosis is present.  ? 5. The inferior vena cava is normal in size with greater than 50%  ?respiratory variability, suggesting right atrial pressure of 3 mmHg.  ?Past Medical History:  ?Diagnosis Date  ? Acid reflux disease   ? Breast cancer ([place]) 06/24/2012  ? right, lower inner  ? Bronchitis   ? Epistaxis   ? went to [facility] ER  ? High cholesterol   ? History of radiation therapy 10/26/2012-11/22/2012  ? 49 gray to the right breast  ? Hypertension 09/12/2012  ? no current bp meds  ? Hypothyroidism   ? Leaking of urine   ?  Dribbles if coughs. Pt wears pad  ? Pneumonia   ? as a child  ? PONV (postoperative nausea and vomiting)   ? Seasonal allergies   ? Shortness of breath   ? with exertion  ? Swelling of left lower extremity   ? x 2 weeks, no sob  ? Swelling of right lower extremity x 2 weeks  ? no sob  ? Thyroid disease   ? Use of tamoxifen (Nolvadex)   ? Wears glasses   ? ? ?Past Surgical History:  ?Procedure Laterality Date  ? ABDOMINAL HYSTERECTOMY  04/15/1975  ? partial, heavy menses  ? bilateral cataract surgery     ? BREAST LUMPECTOMY WITH NEEDLE LOCALIZATION AND AXILLARY SENTINEL LYMPH NODE BX Right 07/22/2012  ?  Procedure: BREAST LUMPECTOMY WITH NEEDLE LOCALIZATION AND AXILLARY SENTINEL LYMPH NODE BX;  Surgeon: Stark Klein, MD;  Location: [facility];  Service: General;  Laterality: Right;  ? CARDIOVASCULAR STRESS TEST  12/07/2012  ? [facility] cardiology  ? CHOLECYSTECTOMY  06/21/1996  ? COLONOSCOPY    ? KNEE ARTHROSCOPY Left 04/11/2002  ? NASAL SEPTUM SURGERY  04/01/1995  ? PORT-A-CATH REMOVAL N/A 11/02/2012  ? Procedure: REMOVAL PORT-A-CATH;  Surgeon: Stark Klein, MD;  [facility];  Service: General;  Laterality: N/A;  ? PORTACATH PLACEMENT Left 08/25/2012  ? Procedure: INSERTION PORT-A-CATH;  Surgeon: Stark Klein, MD;  Location: [place];  Service: General;  Laterality: Left;  ? UPPER GI ENDOSCOPY    ? last in 05/10/10  ? ? ?MEDICATIONS: ? acidophilus (RISAQUAD) CAPS capsule  ? apixaban (ELIQUIS) 5 MG TABS tablet  ? Ascorbic Acid (VITAMIN C) 1000 MG tablet  ? b complex vitamins tablet  ? calcium carbonate (OSCAL) 1500 (600 Ca) MG TABS tablet  ? cetirizine (ZYRTEC) 10 MG tablet  ? Cholecalciferol (VITAMIN D) 50 MCG (2000 UT) tablet  ? denosumab (PROLIA) 60 MG/ML SOSY injection  ? famotidine (PEPCID) 20 MG tablet  ? fluticasone (FLONASE) 50 MCG/ACT nasal spray  ? levothyroxine (SYNTHROID, LEVOTHROID) 75 MCG tablet  ? losartan (COZAAR) 25 MG tablet  ? metoprolol succinate (TOPROL XL) 25 MG 24 hr tablet  ? Multiple Vitamins-Minerals (HAIR/SKIN/NAILS) TABS  ? rosuvastatin (CRESTOR) 10 MG tablet  ? TURMERIC PO  ? valACYclovir (VALTREX) 1000 MG tablet  ? ?No current facility-administered medications for this encounter.  ? ? ?Konrad Felix Ward, PA-C ?[facility] ?[phone] ? ? ? ? ? ? ?

## 2021-07-24 ENCOUNTER — Inpatient Hospital Stay (HOSPITAL_COMMUNITY): Payer: Medicare HMO | Admitting: Physician Assistant

## 2021-07-24 ENCOUNTER — Encounter (HOSPITAL_COMMUNITY)
Admission: RE | Disposition: A | Discharge status: Home / Self Care | Payer: Self-pay | Source: Home / Self Care | Attending: Surgery

## 2021-07-24 ENCOUNTER — Inpatient Hospital Stay (HOSPITAL_COMMUNITY): Payer: Medicare HMO | Admitting: Certified Registered Nurse Anesthetist

## 2021-07-24 ENCOUNTER — Other Ambulatory Visit: Payer: Self-pay

## 2021-07-24 ENCOUNTER — Encounter (HOSPITAL_COMMUNITY): Payer: Self-pay | Admitting: Surgery

## 2021-07-24 ENCOUNTER — Inpatient Hospital Stay (HOSPITAL_COMMUNITY)
Admission: RE | Admit: 2021-07-24 | Discharge: 2021-07-27 | DRG: 331 | Disposition: A | Discharge status: Home / Self Care | Payer: Medicare HMO | Attending: Surgery | Admitting: Surgery

## 2021-07-24 DIAGNOSIS — I10 Essential (primary) hypertension: Secondary | ICD-10-CM

## 2021-07-24 DIAGNOSIS — E7800 Pure hypercholesterolemia, unspecified: Secondary | ICD-10-CM | POA: Diagnosis not present

## 2021-07-24 DIAGNOSIS — Z853 Personal history of malignant neoplasm of breast: Secondary | ICD-10-CM

## 2021-07-24 DIAGNOSIS — Z9842 Cataract extraction status, left eye: Secondary | ICD-10-CM | POA: Diagnosis not present

## 2021-07-24 DIAGNOSIS — Z90710 Acquired absence of both cervix and uterus: Secondary | ICD-10-CM

## 2021-07-24 DIAGNOSIS — Z9049 Acquired absence of other specified parts of digestive tract: Secondary | ICD-10-CM

## 2021-07-24 DIAGNOSIS — Z7901 Long term (current) use of anticoagulants: Secondary | ICD-10-CM

## 2021-07-24 DIAGNOSIS — Z801 Family history of malignant neoplasm of trachea, bronchus and lung: Secondary | ICD-10-CM | POA: Diagnosis not present

## 2021-07-24 DIAGNOSIS — I480 Paroxysmal atrial fibrillation: Secondary | ICD-10-CM | POA: Diagnosis present

## 2021-07-24 DIAGNOSIS — Z923 Personal history of irradiation: Secondary | ICD-10-CM | POA: Diagnosis not present

## 2021-07-24 DIAGNOSIS — C50919 Malignant neoplasm of unspecified site of unspecified female breast: Secondary | ICD-10-CM | POA: Diagnosis not present

## 2021-07-24 DIAGNOSIS — Z9841 Cataract extraction status, right eye: Secondary | ICD-10-CM

## 2021-07-24 DIAGNOSIS — Z8042 Family history of malignant neoplasm of prostate: Secondary | ICD-10-CM | POA: Diagnosis not present

## 2021-07-24 DIAGNOSIS — Z833 Family history of diabetes mellitus: Secondary | ICD-10-CM

## 2021-07-24 DIAGNOSIS — E785 Hyperlipidemia, unspecified: Secondary | ICD-10-CM | POA: Diagnosis present

## 2021-07-24 DIAGNOSIS — C189 Malignant neoplasm of colon, unspecified: Secondary | ICD-10-CM | POA: Diagnosis not present

## 2021-07-24 DIAGNOSIS — K219 Gastro-esophageal reflux disease without esophagitis: Secondary | ICD-10-CM | POA: Diagnosis present

## 2021-07-24 DIAGNOSIS — Z803 Family history of malignant neoplasm of breast: Secondary | ICD-10-CM | POA: Diagnosis not present

## 2021-07-24 DIAGNOSIS — E039 Hypothyroidism, unspecified: Secondary | ICD-10-CM | POA: Diagnosis present

## 2021-07-24 DIAGNOSIS — Z800 Family history of malignant neoplasm of digestive organs: Secondary | ICD-10-CM | POA: Diagnosis not present

## 2021-07-24 DIAGNOSIS — Z8051 Family history of malignant neoplasm of kidney: Secondary | ICD-10-CM

## 2021-07-24 DIAGNOSIS — C180 Malignant neoplasm of cecum: Secondary | ICD-10-CM | POA: Diagnosis not present

## 2021-07-24 HISTORY — PX: LAPAROSCOPIC RIGHT HEMI COLECTOMY: SHX5926

## 2021-07-24 LAB — ABO/RH: ABO/RH(D): A POS

## 2021-07-24 SURGERY — LAPAROSCOPIC RIGHT HEMI COLECTOMY
Anesthesia: General

## 2021-07-24 MED ORDER — HYDROMORPHONE HCL 1 MG/ML IJ SOLN: 0.5000 mg | INTRAMUSCULAR | Status: DC | PRN

## 2021-07-24 MED ORDER — POLYETHYLENE GLYCOL 3350 17 GM/SCOOP PO POWD
1.0000 | Freq: Once | ORAL | Status: DC
Start: 2021-07-24 — End: 2021-07-24

## 2021-07-24 MED ORDER — MIDAZOLAM HCL 2 MG/2ML IJ SOLN
INTRAMUSCULAR | Status: AC
  Filled 2021-07-24: qty 2

## 2021-07-24 MED ORDER — DEXAMETHASONE SODIUM PHOSPHATE 10 MG/ML IJ SOLN
INTRAMUSCULAR | Status: AC
  Filled 2021-07-24: qty 1

## 2021-07-24 MED ORDER — FENTANYL CITRATE (PF) 100 MCG/2ML IJ SOLN
INTRAMUSCULAR | Status: AC
  Filled 2021-07-24: qty 2

## 2021-07-24 MED ORDER — BUPIVACAINE LIPOSOME 1.3 % IJ SUSP: 20.0000 mL | Freq: Once | INTRAMUSCULAR | Status: DC

## 2021-07-24 MED ORDER — DIPHENHYDRAMINE HCL 50 MG/ML IJ SOLN
INTRAMUSCULAR | Status: DC | PRN
  Administered 2021-07-24: 12.5 mg via INTRAVENOUS

## 2021-07-24 MED ORDER — DIPHENHYDRAMINE HCL 12.5 MG/5ML PO ELIX: 12.5000 mg | ORAL_SOLUTION | Freq: Four times a day (QID) | ORAL | Status: DC | PRN

## 2021-07-24 MED ORDER — PROPOFOL 10 MG/ML IV BOLUS
INTRAVENOUS | Status: AC
  Filled 2021-07-24: qty 20

## 2021-07-24 MED ORDER — HEPARIN SODIUM (PORCINE) 5000 UNIT/ML IJ SOLN
5000.0000 [IU] | Freq: Three times a day (TID) | INTRAMUSCULAR | Status: DC
  Administered 2021-07-24 – 2021-07-27 (×8): 5000 [IU] via SUBCUTANEOUS
  Filled 2021-07-24 (×8): qty 1

## 2021-07-24 MED ORDER — MIDAZOLAM HCL 2 MG/2ML IJ SOLN: 0.5000 mg | Freq: Once | INTRAMUSCULAR | Status: DC | PRN

## 2021-07-24 MED ORDER — ACETAMINOPHEN 500 MG PO TABS
1000.0000 mg | ORAL_TABLET | Freq: Four times a day (QID) | ORAL | Status: DC
  Administered 2021-07-24 – 2021-07-27 (×10): 1000 mg via ORAL
  Filled 2021-07-24 (×9): qty 2

## 2021-07-24 MED ORDER — ENSURE PRE-SURGERY PO LIQD
296.0000 mL | Freq: Once | ORAL | Status: DC
  Filled 2021-07-24: qty 296

## 2021-07-24 MED ORDER — ONDANSETRON HCL 4 MG/2ML IJ SOLN
INTRAMUSCULAR | Status: AC
  Filled 2021-07-24: qty 2

## 2021-07-24 MED ORDER — OXYCODONE HCL 5 MG/5ML PO SOLN: 5.0000 mg | Freq: Once | ORAL | Status: AC | PRN

## 2021-07-24 MED ORDER — SODIUM CHLORIDE 0.9 % IV SOLN
2.0000 g | INTRAVENOUS | Status: AC
  Administered 2021-07-24: 2 g via INTRAVENOUS
  Filled 2021-07-24: qty 2

## 2021-07-24 MED ORDER — BISACODYL 5 MG PO TBEC: 20.0000 mg | DELAYED_RELEASE_TABLET | Freq: Once | ORAL | Status: DC

## 2021-07-24 MED ORDER — LACTATED RINGERS IV SOLN: INTRAVENOUS | Status: DC | PRN

## 2021-07-24 MED ORDER — ENSURE SURGERY PO LIQD
237.0000 mL | Freq: Two times a day (BID) | ORAL | Status: DC
  Administered 2021-07-25 – 2021-07-26 (×4): 237 mL via ORAL

## 2021-07-24 MED ORDER — TRAMADOL HCL 50 MG PO TABS
ORAL_TABLET | ORAL | Status: AC
  Filled 2021-07-24: qty 1

## 2021-07-24 MED ORDER — BUPIVACAINE LIPOSOME 1.3 % IJ SUSP
INTRAMUSCULAR | Status: DC | PRN
  Administered 2021-07-24: 20 mL

## 2021-07-24 MED ORDER — HYDROMORPHONE HCL 1 MG/ML IJ SOLN
0.2500 mg | INTRAMUSCULAR | Status: DC | PRN
  Administered 2021-07-24 (×3): 0.5 mg via INTRAVENOUS

## 2021-07-24 MED ORDER — CHLORHEXIDINE GLUCONATE CLOTH 2 % EX PADS: 6.0000 | MEDICATED_PAD | Freq: Once | CUTANEOUS | Status: DC

## 2021-07-24 MED ORDER — ROCURONIUM BROMIDE 100 MG/10ML IV SOLN
INTRAVENOUS | Status: DC | PRN
  Administered 2021-07-24: 60 mg via INTRAVENOUS

## 2021-07-24 MED ORDER — BUPIVACAINE-EPINEPHRINE 0.25% -1:200000 IJ SOLN
INTRAMUSCULAR | Status: DC | PRN
  Administered 2021-07-24: 30 mL

## 2021-07-24 MED ORDER — DEXAMETHASONE SODIUM PHOSPHATE 10 MG/ML IJ SOLN
INTRAMUSCULAR | Status: DC | PRN
  Administered 2021-07-24: 4 mg via INTRAVENOUS

## 2021-07-24 MED ORDER — SIMETHICONE 80 MG PO CHEW: 40.0000 mg | CHEWABLE_TABLET | Freq: Four times a day (QID) | ORAL | Status: DC | PRN

## 2021-07-24 MED ORDER — ALVIMOPAN 12 MG PO CAPS
12.0000 mg | ORAL_CAPSULE | ORAL | Status: AC
  Administered 2021-07-24: 12 mg via ORAL
  Filled 2021-07-24: qty 1

## 2021-07-24 MED ORDER — IBUPROFEN 400 MG PO TABS
600.0000 mg | ORAL_TABLET | Freq: Four times a day (QID) | ORAL | Status: DC | PRN
  Filled 2021-07-24: qty 1

## 2021-07-24 MED ORDER — LIDOCAINE HCL (CARDIAC) PF 100 MG/5ML IV SOSY
PREFILLED_SYRINGE | INTRAVENOUS | Status: DC | PRN
  Administered 2021-07-24: 40 mg via INTRAVENOUS

## 2021-07-24 MED ORDER — BUPIVACAINE-EPINEPHRINE (PF) 0.25% -1:200000 IJ SOLN
INTRAMUSCULAR | Status: AC
  Filled 2021-07-24: qty 30

## 2021-07-24 MED ORDER — HYDROMORPHONE HCL 1 MG/ML IJ SOLN
INTRAMUSCULAR | Status: AC
  Administered 2021-07-24: 0.5 mg via INTRAVENOUS
  Filled 2021-07-24: qty 2

## 2021-07-24 MED ORDER — SUGAMMADEX SODIUM 200 MG/2ML IV SOLN
INTRAVENOUS | Status: DC | PRN
  Administered 2021-07-24: 150 mg via INTRAVENOUS

## 2021-07-24 MED ORDER — STERILE WATER FOR IRRIGATION IR SOLN
Status: DC | PRN
  Administered 2021-07-24: 1000 mL

## 2021-07-24 MED ORDER — ALUM & MAG HYDROXIDE-SIMETH 200-200-20 MG/5ML PO SUSP: 30.0000 mL | Freq: Four times a day (QID) | ORAL | Status: DC | PRN

## 2021-07-24 MED ORDER — SODIUM CHLORIDE 0.9 % IV SOLN
12.5000 mg | Freq: Four times a day (QID) | INTRAVENOUS | Status: DC | PRN
  Filled 2021-07-24: qty 0.5

## 2021-07-24 MED ORDER — LACTATED RINGERS IV SOLN: INTRAVENOUS | Status: DC

## 2021-07-24 MED ORDER — DIPHENHYDRAMINE HCL 50 MG/ML IJ SOLN: 12.5000 mg | Freq: Four times a day (QID) | INTRAMUSCULAR | Status: DC | PRN

## 2021-07-24 MED ORDER — NEOMYCIN SULFATE 500 MG PO TABS: 1000.0000 mg | ORAL_TABLET | ORAL | Status: DC

## 2021-07-24 MED ORDER — 0.9 % SODIUM CHLORIDE (POUR BTL) OPTIME
TOPICAL | Status: DC | PRN
  Administered 2021-07-24: 2000 mL

## 2021-07-24 MED ORDER — FENTANYL CITRATE (PF) 100 MCG/2ML IJ SOLN
INTRAMUSCULAR | Status: DC | PRN
  Administered 2021-07-24: 50 ug via INTRAVENOUS
  Administered 2021-07-24: 100 ug via INTRAVENOUS

## 2021-07-24 MED ORDER — PROPOFOL 10 MG/ML IV BOLUS
INTRAVENOUS | Status: DC | PRN
Start: 2021-07-24 — End: 2021-07-24
  Administered 2021-07-24: 100 mg via INTRAVENOUS

## 2021-07-24 MED ORDER — BUPIVACAINE LIPOSOME 1.3 % IJ SUSP
INTRAMUSCULAR | Status: AC
  Filled 2021-07-24: qty 20

## 2021-07-24 MED ORDER — TRAMADOL HCL 50 MG PO TABS
50.0000 mg | ORAL_TABLET | Freq: Four times a day (QID) | ORAL | Status: DC | PRN
  Administered 2021-07-24 – 2021-07-25 (×2): 50 mg via ORAL
  Filled 2021-07-24: qty 1

## 2021-07-24 MED ORDER — METRONIDAZOLE 500 MG PO TABS
1000.0000 mg | ORAL_TABLET | ORAL | Status: DC
Start: 2021-07-24 — End: 2021-07-24

## 2021-07-24 MED ORDER — MIDAZOLAM HCL 5 MG/5ML IJ SOLN
INTRAMUSCULAR | Status: DC | PRN
Start: 2021-07-24 — End: 2021-07-24
  Administered 2021-07-24: 1 mg via INTRAVENOUS

## 2021-07-24 MED ORDER — KETAMINE HCL 50 MG/5ML IJ SOSY
PREFILLED_SYRINGE | INTRAMUSCULAR | Status: AC
  Filled 2021-07-24: qty 5

## 2021-07-24 MED ORDER — ACETAMINOPHEN 500 MG PO TABS
1000.0000 mg | ORAL_TABLET | ORAL | Status: AC
  Administered 2021-07-24: 1000 mg via ORAL
  Filled 2021-07-24: qty 2

## 2021-07-24 MED ORDER — LACTATED RINGERS IR SOLN
Status: DC | PRN
  Administered 2021-07-24: 1000 mL

## 2021-07-24 MED ORDER — HEPARIN SODIUM (PORCINE) 5000 UNIT/ML IJ SOLN
5000.0000 [IU] | Freq: Once | INTRAMUSCULAR | Status: AC
  Administered 2021-07-24: 5000 [IU] via SUBCUTANEOUS
  Filled 2021-07-24: qty 1

## 2021-07-24 MED ORDER — KETAMINE HCL 10 MG/ML IJ SOLN
INTRAMUSCULAR | Status: DC | PRN
  Administered 2021-07-24: 35 mg via INTRAVENOUS

## 2021-07-24 MED ORDER — ENSURE PRE-SURGERY PO LIQD
592.0000 mL | Freq: Once | ORAL | Status: DC
  Filled 2021-07-24: qty 592

## 2021-07-24 MED ORDER — OXYCODONE HCL 5 MG PO TABS
ORAL_TABLET | ORAL | Status: AC
  Filled 2021-07-24: qty 1

## 2021-07-24 MED ORDER — ONDANSETRON HCL 4 MG PO TABS
4.0000 mg | ORAL_TABLET | Freq: Four times a day (QID) | ORAL | Status: DC | PRN
  Filled 2021-07-24: qty 1

## 2021-07-24 MED ORDER — HYDRALAZINE HCL 20 MG/ML IJ SOLN: 10.0000 mg | INTRAMUSCULAR | Status: DC | PRN

## 2021-07-24 MED ORDER — ONDANSETRON HCL 4 MG/2ML IJ SOLN
4.0000 mg | Freq: Four times a day (QID) | INTRAMUSCULAR | Status: DC | PRN
  Administered 2021-07-24: 4 mg via INTRAVENOUS

## 2021-07-24 MED ORDER — PHENYLEPHRINE HCL-NACL 20-0.9 MG/250ML-% IV SOLN
INTRAVENOUS | Status: DC | PRN
  Administered 2021-07-24: 50 ug/min via INTRAVENOUS

## 2021-07-24 MED ORDER — CHLORHEXIDINE GLUCONATE 0.12 % MT SOLN
15.0000 mL | Freq: Once | OROMUCOSAL | Status: AC
  Administered 2021-07-24: 15 mL via OROMUCOSAL

## 2021-07-24 MED ORDER — ALVIMOPAN 12 MG PO CAPS
12.0000 mg | ORAL_CAPSULE | Freq: Two times a day (BID) | ORAL | Status: DC
  Administered 2021-07-25 – 2021-07-26 (×3): 12 mg via ORAL
  Filled 2021-07-24 (×3): qty 1

## 2021-07-24 MED ORDER — ORAL CARE MOUTH RINSE: 15.0000 mL | Freq: Once | OROMUCOSAL | Status: AC

## 2021-07-24 MED ORDER — ROCURONIUM BROMIDE 10 MG/ML (PF) SYRINGE
PREFILLED_SYRINGE | INTRAVENOUS | Status: AC
  Filled 2021-07-24: qty 10

## 2021-07-24 MED ORDER — OXYCODONE HCL 5 MG PO TABS
5.0000 mg | ORAL_TABLET | Freq: Once | ORAL | Status: AC | PRN
  Administered 2021-07-24: 5 mg via ORAL

## 2021-07-24 MED ORDER — LIDOCAINE HCL (PF) 2 % IJ SOLN
INTRAMUSCULAR | Status: AC
  Filled 2021-07-24: qty 5

## 2021-07-24 SURGICAL SUPPLY — 67 items
APL PRP STRL LF DISP 70% ISPRP (MISCELLANEOUS) ×1
APPLIER CLIP ROT 10 11.4 M/L (STAPLE)
APR CLP MED LRG 11.4X10 (STAPLE)
BAG COUNTER SPONGE SURGICOUNT (BAG) IMPLANT
BAG SPNG CNTER NS LX DISP (BAG)
BLADE CLIPPER SURG (BLADE) IMPLANT
CABLE HIGH FREQUENCY MONO STRZ (ELECTRODE) ×4 IMPLANT
CELLS DAT CNTRL 66122 CELL SVR (MISCELLANEOUS) IMPLANT
CHLORAPREP W/TINT 26 (MISCELLANEOUS) ×2 IMPLANT
CLIP APPLIE ROT 10 11.4 M/L (STAPLE) IMPLANT
DISSECTOR BLUNT TIP ENDO 5MM (MISCELLANEOUS) IMPLANT
ELECT REM PT RETURN 15FT ADLT (MISCELLANEOUS) ×2 IMPLANT
GAUZE SPONGE 4X4 12PLY STRL (GAUZE/BANDAGES/DRESSINGS) ×2 IMPLANT
GLOVE BIO SURGEON STRL SZ7.5 (GLOVE) ×4 IMPLANT
GLOVE SKINSENSE NS SZ8.0 LF (GLOVE) ×2
GLOVE SKINSENSE STRL SZ8.0 LF (GLOVE) ×2 IMPLANT
GOWN STRL REUS W/ TWL XL LVL3 (GOWN DISPOSABLE) ×4 IMPLANT
GOWN STRL REUS W/TWL XL LVL3 (GOWN DISPOSABLE) ×8
IRRIG SUCT STRYKERFLOW 2 WTIP (MISCELLANEOUS)
IRRIGATION SUCT STRKRFLW 2 WTP (MISCELLANEOUS) IMPLANT
KIT TURNOVER KIT A (KITS) IMPLANT
LIGASURE IMPACT 36 18CM CVD LR (INSTRUMENTS) IMPLANT
NS IRRIG 1000ML POUR BTL (IV SOLUTION) ×2 IMPLANT
PACK COLON (CUSTOM PROCEDURE TRAY) ×2 IMPLANT
PAD POSITIONING PINK XL (MISCELLANEOUS) IMPLANT
PENCIL SMOKE EVACUATOR (MISCELLANEOUS) IMPLANT
PROTECTOR NERVE ULNAR (MISCELLANEOUS) IMPLANT
RELOAD PROXIMATE 75MM BLUE (ENDOMECHANICALS) ×4 IMPLANT
RELOAD STAPLE 75 3.8 BLU REG (ENDOMECHANICALS) IMPLANT
RETRACTOR WND ALEXIS 18 MED (MISCELLANEOUS) IMPLANT
RTRCTR WOUND ALEXIS 18CM MED (MISCELLANEOUS)
SCISSORS LAP 5X35 DISP (ENDOMECHANICALS) ×2 IMPLANT
SEALER TISSUE G2 STRG ARTC 35C (ENDOMECHANICALS) ×1 IMPLANT
SET TUBE SMOKE EVAC HIGH FLOW (TUBING) ×2 IMPLANT
SHEARS HARMONIC ACE PLUS 36CM (ENDOMECHANICALS) IMPLANT
SLEEVE ADV FIXATION 5X100MM (TROCAR) ×4 IMPLANT
SLEEVE ENDOPATH XCEL 5M (ENDOMECHANICALS) ×2 IMPLANT
SPIKE FLUID TRANSFER (MISCELLANEOUS) ×2 IMPLANT
STAPLER 90 3.5 STAND SLIM (STAPLE)
STAPLER 90 3.5 STD SLIM (STAPLE) IMPLANT
STAPLER GUN LINEAR PROX 60 (STAPLE) ×1 IMPLANT
STAPLER PROXIMATE 75MM BLUE (STAPLE) ×1 IMPLANT
STAPLER VISISTAT 35W (STAPLE) ×2 IMPLANT
SUT MNCRL AB 4-0 PS2 18 (SUTURE) ×3 IMPLANT
SUT PDS AB 1 CT1 27 (SUTURE) ×4 IMPLANT
SUT PROLENE 2 0 CT2 30 (SUTURE) IMPLANT
SUT PROLENE 2 0 KS (SUTURE) IMPLANT
SUT SILK 2 0 (SUTURE)
SUT SILK 2 0 SH CR/8 (SUTURE) ×1 IMPLANT
SUT SILK 2-0 18XBRD TIE 12 (SUTURE) ×1 IMPLANT
SUT SILK 3 0 (SUTURE)
SUT SILK 3 0 SH CR/8 (SUTURE) ×3 IMPLANT
SUT SILK 3-0 18XBRD TIE 12 (SUTURE) ×1 IMPLANT
SYS LAPSCP GELPORT 120MM (MISCELLANEOUS)
SYS WOUND ALEXIS 18CM MED (MISCELLANEOUS) ×2
SYSTEM LAPSCP GELPORT 120MM (MISCELLANEOUS) IMPLANT
SYSTEM WOUND ALEXIS 18CM MED (MISCELLANEOUS) ×1 IMPLANT
TAPE CLOTH 4X10 WHT NS (GAUZE/BANDAGES/DRESSINGS) IMPLANT
TOWEL OR 17X26 10 PK STRL BLUE (TOWEL DISPOSABLE) ×2 IMPLANT
TRAY FOLEY MTR SLVR 14FR STAT (SET/KITS/TRAYS/PACK) ×2 IMPLANT
TRAY FOLEY MTR SLVR 16FR STAT (SET/KITS/TRAYS/PACK) IMPLANT
TROCAR ADV FIXATION 5X100MM (TROCAR) ×2 IMPLANT
TROCAR BALLN 12MMX100 BLUNT (TROCAR) ×2 IMPLANT
TROCAR XCEL 12X100 BLDLESS (ENDOMECHANICALS) ×2 IMPLANT
TROCAR XCEL NON-BLD 11X100MML (ENDOMECHANICALS) IMPLANT
TUBING CONNECTING 10 (TUBING) ×4 IMPLANT
YANKAUER SUCT BULB TIP NO VENT (SUCTIONS) ×2 IMPLANT

## 2021-07-24 NOTE — Anesthesia Postprocedure Evaluation (Signed)
Anesthesia Post Note ? ?Patient: Shelia Obrien ? ?Procedure(s) Performed: LAPAROSCOPIC RIGHT HEMI COLECTOMY ? ?  ? ?Patient location during evaluation: PACU ?Anesthesia Type: General ?Level of consciousness: sedated, patient cooperative and oriented ?Pain management: pain level controlled ?Vital Signs Assessment: post-procedure vital signs reviewed and stable ?Respiratory status: spontaneous breathing, nonlabored ventilation and respiratory function stable ?Cardiovascular status: blood pressure returned to baseline and stable ?Postop Assessment: no apparent nausea or vomiting ?Anesthetic complications: no ? ? ?No notable events documented. ? ?Last Vitals:  ?Vitals:  ? 07/24/21 1615 07/24/21 1630  ?BP: 123/71 117/69  ?Pulse: 70 65  ?Resp: 17 11  ?Temp:  36.4 ?C  ?SpO2: 96% 98%  ?  ?Last Pain:  ?Vitals:  ? 07/24/21 1630  ?TempSrc:   ?PainSc: Asleep  ? ? ?  ?  ?  ?  ?  ?  ? ?Khristian Phillippi,E. Renella Steig ? ? ? ? ?

## 2021-07-24 NOTE — Op Note (Addendum)
PATIENT: Shelia Obrien  81 y.o. female ? ?Patient Care Team: ?Shelia Infante, MD as PCP - General (Internal Medicine) ?Shelia Harp, MD as PCP - Cardiology (Cardiology) ? ?PREOP DIAGNOSIS: Colon cancer ? ?POSTOP DIAGNOSIS: Same ? ?PROCEDURE: Laparoscopic right hemicolectomy ? ?SURGEON: Sharon Mt. Jaquasha Carnevale, MD ? ?ASSISTANT: Leighton Ruff, MD ? ?ANESTHESIA: General endotracheal ? ?EBL: 20 mL ?Total I/O ?In: -  ?Out: 20 [Blood:20] ? ?DRAINS: None ? ?SPECIMEN: Right colon (including terminal ileum and appendix en bloc) ? ?COUNTS: Sponge, needle and instrument counts were reported correct x2 ? ?FINDINGS:  ?Evident mass like thickening within the cecum consistent with endoscopic notations.  Relatively normal-appearing liver without evident masses on the surface.  Peritoneal surfaces are normal in appearance.  Right hemicolectomy is carried out laparoscopically including the right branch of the middle colic vessels. ? ?NARRATIVE:  ?The patient was identified & brought into the operating room, placed supine on the operating table and SCDs were applied to the lower extremities. General endotracheal anesthesia was induced. The patient was positioned supine with arms tucked. Antibiotics were administered. A foley catheter was placed under sterile conditions. Hair in the region of planned surgery was clipped. The abdomen was prepped and draped in a sterile fashion. A timeout was performed confirming our patient and plan.  ? ?Beginning with the extraction port, a supraumbilical incision was made and carried down to the midline fascia. This was then incised with electrocautery. The peritoneum was identified and elevated between clamps and carefully opened sharply. A small Alexis wound protector with a cap and associated port was then placed. The abdomen was insufflated to 15 mmHg with CO2. A laparoscope was placed and camera inspection revealed no evidence of injury. Bilateral TAP blocks were then performed under  laparoscopic visualization using a mixture of 0.25% marcaine with epinepherine + Exparel. 3 additional ports were then placed under direct laparoscopic visualization - two in the left hemiabdomen and one in the right abdomen. The abdomen was surveyed. The liver and peritoneum appeared normal.  There were no signs of metastatic disease. ? ?The patient was positioned in trendelenburg with left side down. The ileocolic pedicle was identified. Gentle blunt dissection commenced around the pedicle and the duodenum was identified and freed from the surrounding structures. The ileocolic pedicle was then divided near its origin using the Enseal device. The stump is inspected and hemostatic.  The retroperitoneal plane was then developed bluntly.  We mobilized the terminal ileum, taking care to avoid injuring any retroperitoneal structures.  After this we began to mobilize laterally down the Raylin Winer line of [place] and then took down the hepatic flexure using the Enseal device. We mobilized the omentum off of the right transverse colon. The entire colon was then flipped medially and mobilized off of the retroperitoneal structures until we could visualize the lateral edge of the duodenum underneath.  We gently freed the duodenal attachments.  ? ?At this point, the abdomen was desufflated and the terminal ileum and right colon delivered through the wound protector. The terminal ileum was then transected using a GIA blue load stapler. The remaining mesentery was divided using the Enseal device. The divided mesentery was inspected and noted to be hemostatic. The distal point of transection was then identified on the transverse colon at a location that included the right branch of the middle colics, leaving the main middle colic feeding the remaining transverse colon. This was transected using another blue load GIA stapler.  The specimen was then passed off. Attention was  turned to creating the anastomosis. The terminal ileum and  transverse colon were inspected for orientation to ensure no twisting nor bowel included in the mesenteric defect. An anastomosis was created between the terminal ileum and the transverse colon using a 75 mm GIA blue load stapler. The staple line was inspected and noted to be hemostatic.  The common enterotomy channel was closed using a TA 60 blue load stapler. Hemostasis was achieved at the staple line using 3-0 silk U-stitches. 3-0 silk sutures were used to imbricate the corners of the staple line as well.  A 2-0 silk suture was placed securing the apex of the anastomosis. The anastomosis was palpated and noted to be widely patent. This was then placed back into the abdomen. The abdomen was then irrigated with sterile saline and hemostasis verified. The omentum was then brought down over the anastomosis. The wound protector cap was replaced and CO2 reinsufflated. The laparoscopic ports were removed under direct visualization and the sites noted to be hemostatic. The Alexis wound protector was removed, counts were reported correct, and we switched to clean instruments, gowns and drapes.  ? ?The fascia was then closed using two running #1 PDS sutures. All counts were reported correct.  The skin of all incision sites was closed with 4-0 monocryl subcuticular suture. Dermabond was placed on the port sites and a sterile dressing was placed over the abdominal incision. The patient was then awakened from anesthesia and sent to the post anesthesia care unit in stable condition.  ? ?DISPOSITION: PACU in satisfactory condition ?

## 2021-07-24 NOTE — Anesthesia Preprocedure Evaluation (Addendum)
Anesthesia Evaluation  ?Patient identified by MRN, date of birth, ID band ?Patient awake ? ? ? ?Reviewed: ?Allergy & Precautions, NPO status , Patient's Chart, lab work & pertinent test results ? ?History of Anesthesia Complications ?(+) PONV ? ?Airway ?Mallampati: III ? ?TM Distance: >3 FB ?Neck ROM: Full ? ? ?Comment: Small mouth Dental ? ?(+) Dental Advisory Given ?  ?Pulmonary ?shortness of breath,  ?  ?breath sounds clear to auscultation ? ? ? ? ? ? Cardiovascular ?hypertension, Pt. on medications and Pt. on home beta blockers ?(-) angina ?Rhythm:Irregular Rate:Normal ? ?'21 ECHO: EF 60 to 65%. The LV has normal function, no regional wall motion abnormalities. Grade I DD, no significant valvular abnormalities ?  ?Neuro/Psych ?negative neurological ROS ?   ? GI/Hepatic ?Neg liver ROS, GERD  Medicated and Controlled,  ?Endo/Other  ?Hypothyroidism  ? Renal/GU ?negative Renal ROS  ? ?  ?Musculoskeletal ? ? Abdominal ?  ?Peds ? Hematology ?Eliquis: last dose Sunday   ?Anesthesia Other Findings ?Breast cancer ? Reproductive/Obstetrics ? ?  ? ? ? ? ? ? ? ? ? ? ? ? ? ?  ?  ? ? ? ? ? ? ? ?Anesthesia Physical ?Anesthesia Plan ? ?ASA: 3 ? ?Anesthesia Plan: General  ? ?Post-op Pain Management: Tylenol PO (pre-op)*  ? ?Induction:  ? ?PONV Risk Score and Plan: 4 or greater and Ondansetron, Dexamethasone and Diphenhydramine ? ?Airway Management Planned: Oral ETT and Video Laryngoscope Planned ? ?Additional Equipment: None ? ?Intra-op Plan:  ? ?Post-operative Plan: Extubation in OR ? ?Informed Consent: I have reviewed the patients History and Physical, chart, labs and discussed the procedure including the risks, benefits and alternatives for the proposed anesthesia with the patient or authorized representative who has indicated his/her understanding and acceptance.  ? ? ? ?Dental advisory given ? ?Plan Discussed with: CRNA and Surgeon ? ?Anesthesia Plan Comments:   ? ? ? ? ? ?Anesthesia Quick  Evaluation ? ?

## 2021-07-24 NOTE — H&P (Signed)
? ?CC: Here today for surgery ? ?HPI: ?Shelia Obrien is an 81 y.o. female with history of HTN, HLD, hypothyroidism, afib (on Eliquis), whom is seen in the office today as a referral by Dr. Joylene Draft / [facility] for evaluation of newly diagnosed colon cancer. ? ?She underwent colonoscopy with Dr. Watt Climes 06/06/2021: ?Hemorrhoids, diverticulosis in sigmoid, tumor near the ileocecal valve, biopsied. ? 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 Mismatch repair by IHC will be reported as an addendum. ? ?CT abdomen/pelvis with contrast 06/07/2021 demonstrates ileocecal valve abnormality that was read as being possibly lipomatous. No definite aggressive appearing neoplasm was noted. No adjacent lymphadenopathy or signs of metastatic disease in the abdomen/pelvis. Colonic diverticulosis. Duodenal diverticulosis. Small hiatal hernia. ? ?She denies any complaints at this juncture. She denies any abdominal pain, fever, chills, nausea, vomiting, bloating. She denies any significant blood in her stool. She does have a history of hemorrhoids and has noted a couple drops of bright red blood with stool. ? ?INTERVAL HX ?She denies any changes in her health history since we met in the office. Off Eliquis since Sunday. Tolerated bowel prep with satisfactory result. No complaints at this time. ? ? ?PMH: HTN, HLD, hypothyroidism, afib (on Eliquis), breast cancer survivor (her and her daughter reports she had a genetic panel that was obtained in 2014 and they were told she did not have any heritable predisposition evident) ? ?PSH: Hysterectomy (transvaginal); laparoscopic cholecystectomy (Dr. Rosana Hoes), breast cancer surgery (Dr. Barry Dienes) ? ?FHx: Mother and father had colon cancer. Denies any other known family history of colorectal, breast, endometrial or ovarian cancer ? ?Social Hx: Denies use of tobacco/EtOH/illicit drug. Happily retired. Previously worked in Museum/gallery curator - Education officer, community ? ?Review of  Systems: ?A complete review of systems was obtained from the patient. I have reviewed this information and discussed as appropriate with the patient. See HPI as well for other ROS. ? ?Past Medical History:  ?Diagnosis Date  ? Acid reflux disease   ? Breast cancer ([place]) 06/24/2012  ? right, lower inner  ? Bronchitis   ? Epistaxis   ? went to [facility] ER  ? High cholesterol   ? History of radiation therapy 10/26/2012-11/22/2012  ? 49 gray to the right breast  ? Hypertension 09/12/2012  ? no current bp meds  ? Hypothyroidism   ? Leaking of urine   ? Dribbles if coughs. Pt wears pad  ? Pneumonia   ? as a child  ? PONV (postoperative nausea and vomiting)   ? Seasonal allergies   ? Shortness of breath   ? with exertion  ? Swelling of left lower extremity   ? x 2 weeks, no sob  ? Swelling of right lower extremity x 2 weeks  ? no sob  ? Thyroid disease   ? Use of tamoxifen (Nolvadex)   ? Wears glasses   ? ? ?Past Surgical History:  ?Procedure Laterality Date  ? ABDOMINAL HYSTERECTOMY  04/15/1975  ? partial, heavy menses  ? bilateral cataract surgery     ? BREAST LUMPECTOMY WITH NEEDLE LOCALIZATION AND AXILLARY SENTINEL LYMPH NODE BX Right 07/22/2012  ? Procedure: BREAST LUMPECTOMY WITH NEEDLE LOCALIZATION AND AXILLARY SENTINEL LYMPH NODE BX;  Surgeon: Stark Klein, MD;  Location: [facility];  Service: General;  Laterality: Right;  ? CARDIOVASCULAR STRESS TEST  12/07/2012  ? [facility] cardiology  ? CHOLECYSTECTOMY  06/21/1996  ? COLONOSCOPY    ? KNEE ARTHROSCOPY Left 04/11/2002  ? NASAL SEPTUM SURGERY  04/01/1995  ?  PORT-A-CATH REMOVAL N/A 11/02/2012  ? Procedure: REMOVAL PORT-A-CATH;  Surgeon: Stark Klein, MD;  Location: [facility];  Service: General;  Laterality: N/A;  ? PORTACATH PLACEMENT Left 08/25/2012  ? Procedure: INSERTION PORT-A-CATH;  Surgeon: Stark Klein, MD;  Location: [facility];  Service: General;  Laterality: Left;  ? UPPER GI ENDOSCOPY    ? last in 05/10/10  ? ? ?Family History  ?Problem Relation Age of  Onset  ? Prostate cancer Father   ? Cancer Father   ?     stomach  ? Cirrhosis Mother   ? Prostate cancer Brother   ? Diabetes Brother   ? Breast cancer Maternal Aunt   ? Lung cancer Maternal Uncle   ? Lung cancer Maternal Grandmother   ? Kidney cancer Maternal Grandmother   ? ? ?Social:  reports that she has never smoked. She has never used smokeless tobacco. She reports that she does not drink alcohol and does not use drugs. ? ?Allergies: No Known Allergies ? ?Medications: I have reviewed the patient's current medications. ? ?No results found for this or any previous visit (from the past 48 hour(s)). ? ?No results found. ? ?ROS - all of the below systems have been reviewed with the patient and positives are indicated with bold text ?General: chills, fever or night sweats ?Eyes: blurry vision or double vision ?ENT: epistaxis or sore throat ?Allergy/Immunology: itchy/watery eyes or nasal congestion ?Hematologic/Lymphatic: bleeding problems, blood clots or swollen lymph nodes ?Endocrine: temperature intolerance or unexpected weight changes ?Breast: new or changing breast lumps or nipple discharge ?Resp: cough, shortness of breath, or wheezing ?CV: chest pain or dyspnea on exertion ?GI: as per HPI ?GU: dysuria, trouble voiding, or hematuria ?MSK: joint pain or joint stiffness ?Neuro: TIA or stroke symptoms ?Derm: pruritus and skin lesion changes ?Psych: anxiety and depression ? ?PE ?There were no vitals taken for this visit. ?Constitutional: NAD; conversant; no deformities ?Eyes: Moist conjunctiva; no lid lag; PERRL ?Lungs: Normal respiratory effort ?CV: irreg rate/rhythm ?GI: Abd soft, NT/ND ?MSK: Normal range of motion of extremities ?Psychiatric: Appropriate affect; alert and oriented x3 ? ?No results found for this or any previous visit (from the past 48 hour(s)). ? ?No results found. ? ? ?A/P: ?Shelia Obrien is an 81 y.o. female with hx of HTN, HLD, hypothyroidism, afib (on Eliquis), remote breast cancer here  for evaluation of newly diagnosed colon cancer-at the ileocecal valve. Moderately differentiated adenocarcinoma on biopsy. ? ?I have personally reviewed her CT scan and there does appear to be abnormal thickening at the cecum/proximal ascending colon near her ileocecal valve consistent with what he saw endoscopically. ? ?-CT chest shows no evidence of metastatic disease ? ?-Cardiac clearance obtained, Dr. Gwenlyn Found, with plans for holding anticoagulation perioperatively (at least 72 hrs prior if feasible) ? ?-The anatomy and physiology of the GI tract was reviewed with the patient. The pathophysiology of colon cancer was discussed as well with associated pictures ? ?-We have discussed various different treatment options going forward including surgery (the most definitive) to address this - laparoscopic right hemicolectomy ? ?-The planned procedure, material risks (including, but not limited to, pain, bleeding, infection, scarring, need for blood transfusion, damage to surrounding structures- blood vessels/nerves/viscus/organs, damage to ureter, urine leak, leak from anastomosis, need for additional procedures, scenarios where a stoma may be necessary and where it may be permanent, worsening of pre-existing medical conditions, hernia, recurrence of cancer despite surgery, pneumonia, heart attack, stroke, death) benefits and alternatives to surgery were  discussed at length. The patient's questions were answered to her satisfaction, she voiced understanding and elected to proceed with surgery. Additionally, we discussed typical postoperative expectations and the recovery process ? ?Nadeen Landau, MD FACS ?[facility], [facility] ?

## 2021-07-24 NOTE — Anesthesia Procedure Notes (Signed)
Procedure Name: Intubation ?Date/Time: 07/24/2021 1:53 PM ?Performed by: Rosaland Lao, CRNA ?Pre-anesthesia Checklist: Patient identified, Emergency Drugs available, Suction available and Patient being monitored ?Patient Re-evaluated:Patient Re-evaluated prior to induction ?Oxygen Delivery Method: Circle system utilized ?Preoxygenation: Pre-oxygenation with 100% oxygen ?Induction Type: IV induction ?Ventilation: Mask ventilation without difficulty ?Laryngoscope Size: Mac and 4 ?Grade View: Grade I ?Tube type: Oral ?Tube size: 7.0 mm ?Number of attempts: 1 ?Airway Equipment and Method: Stylet ?Placement Confirmation: ETT inserted through vocal cords under direct vision, positive ETCO2 and breath sounds checked- equal and bilateral ?Secured at: 22 cm ?Tube secured with: Tape ?Dental Injury: Teeth and Oropharynx as per pre-operative assessment  ? ? ? ? ?

## 2021-07-24 NOTE — Transfer of Care (Signed)
Immediate Anesthesia Transfer of Care Note ? ?Patient: Shelia Obrien ? ?Procedure(s) Performed: LAPAROSCOPIC RIGHT HEMI COLECTOMY ? ?Patient Location: PACU ? ?Anesthesia Type:General ? ?Level of Consciousness: drowsy ? ?Airway & Oxygen Therapy: Patient Spontanous Breathing and Patient connected to face mask ? ?Post-op Assessment: Report given to RN and Post -op Vital signs reviewed and stable ? ?Post vital signs: Reviewed and stable ? ?Last Vitals:  ?Vitals Value Taken Time  ?BP    ?Temp    ?Pulse    ?Resp    ?SpO2    ? ? ?Last Pain:  ?Vitals:  ? 07/24/21 1219  ?TempSrc:   ?PainSc: 0-No pain  ?   ? ?Patients Stated Pain Goal: 3 (07/24/21 1219) ? ?Complications: No notable events documented. ?

## 2021-07-24 NOTE — Progress Notes (Signed)
Mrs . Shelia Obrien is at low risk for major cardiovascular complications with the planned surgical procedure. Eliquis may be stopped for 3 days before the procedure with low risk of embolic events. Please ensure metoprolol is not interrupted during the perioperative period. ?

## 2021-07-25 ENCOUNTER — Encounter (HOSPITAL_COMMUNITY): Payer: Self-pay | Admitting: Surgery

## 2021-07-25 LAB — BASIC METABOLIC PANEL
Anion gap: 7 (ref 5–15)
BUN: 13 mg/dL (ref 8–23)
CO2: 23 mmol/L (ref 22–32)
Calcium: 8.4 mg/dL — ABNORMAL LOW (ref 8.9–10.3)
Chloride: 106 mmol/L (ref 98–111)
Creatinine, Ser: 0.98 mg/dL (ref 0.44–1.00)
GFR, Estimated: 58 mL/min — ABNORMAL LOW (ref >60)
Glucose, Bld: 131 mg/dL — ABNORMAL HIGH (ref 70–99)
Potassium: 4.2 mmol/L (ref 3.5–5.1)
Sodium: 136 mmol/L (ref 135–145)

## 2021-07-25 LAB — CBC
HCT: 36 % (ref 36.0–46.0)
Hemoglobin: 12.2 g/dL (ref 12.0–15.0)
MCH: 31.6 pg (ref 26.0–34.0)
MCHC: 33.9 g/dL (ref 30.0–36.0)
MCV: 93.3 fL (ref 80.0–100.0)
Platelets: 211 10*3/uL (ref 150–400)
RBC: 3.86 MIL/uL — ABNORMAL LOW (ref 3.87–5.11)
RDW: 13.1 % (ref 11.5–15.5)
WBC: 12.7 10*3/uL — ABNORMAL HIGH (ref 4.0–10.5)
nRBC: 0 % (ref 0.0–0.2)

## 2021-07-25 MED ORDER — TRAMADOL HCL 50 MG PO TABS: 50.0000 mg | ORAL_TABLET | Freq: Four times a day (QID) | ORAL | 0 refills | Status: AC | PRN

## 2021-07-25 NOTE — Discharge Instructions (Signed)
POST OP INSTRUCTIONS AFTER COLON SURGERY  DIET: Be sure to include lots of fluids daily to stay hydrated - 64oz of water per day (8, 8 oz glasses).  Avoid fast food or heavy meals for the first couple of weeks as your are more likely to get nauseated. Avoid raw/uncooked fruits or vegetables for the first 4 weeks (its ok to have these if they are blended into smoothie form). If you have fruits/vegetables, make sure they are cooked until soft enough to mash on the roof of your mouth and chew your food well. Otherwise, diet as tolerated.  Take your usually prescribed home medications unless otherwise directed.  PAIN CONTROL: Pain is best controlled by a usual combination of three different methods TOGETHER: Ice/Heat Over the counter pain medication Prescription pain medication Most patients will experience some swelling and bruising around the surgical site.  Ice packs or heating pads (30-60 minutes up to 6 times a day) will help. Some people prefer to use ice alone, heat alone, alternating between ice & heat.  Experiment to what works for you.  Swelling and bruising can take several weeks to resolve.   It is helpful to take an over-the-counter pain medication regularly for the first few weeks: Ibuprofen (Motrin/Advil) - 200mg tabs - take 3 tabs (600mg) every 6 hours as needed for pain (unless you have been directed previously to avoid NSAIDs/ibuprofen) Acetaminophen (Tylenol) - you may take 650mg every 6 hours as needed. You can take this with motrin as they act differently on the body. If you are taking a narcotic pain medication that has acetaminophen in it, do not take over the counter tylenol at the same time. NOTE: You may take both of these medications together - most patients  find it most helpful when alternating between the two (i.e. Ibuprofen at 6am, tylenol at 9am, ibuprofen at 12pm ...) A  prescription for pain medication should be given to you upon discharge.  Take your pain medication as  prescribed if your pain is not adequatly controlled with the over-the-counter pain reliefs mentioned above.  Avoid getting constipated.  Between the surgery and the pain medications, it is common to experience some constipation.  Increasing fluid intake and taking a fiber supplement (such as Metamucil, Citrucel, FiberCon, MiraLax, etc) 1-2 times a day regularly will usually help prevent this problem from occurring.  A mild laxative (prune juice, Milk of Magnesia, MiraLax, etc) should be taken according to package directions if there are no bowel movements after 48 hours.    Dressing: Your incisions are covered in Dermabond which is like sterile superglue for the skin. This will come off on it's own in a couple weeks. It is waterproof and you may bathe normally starting the day after your surgery in a shower. Avoid baths/pools/lakes/oceans until your wounds have fully healed.  ACTIVITIES as tolerated:   Avoid heavy lifting (>10lbs or 1 gallon of milk) for the next 6 weeks. You may resume regular daily activities as tolerated--such as daily self-care, walking, climbing stairs--gradually increasing activities as tolerated.  If you can walk 30 minutes without difficulty, it is safe to try more intense activity such as jogging, treadmill, bicycling, low-impact aerobics.  DO NOT PUSH THROUGH PAIN.  Let pain be your guide: If it hurts to do something, don't do it. You may drive when you are no longer taking prescription pain medication, you can comfortably wear a seatbelt, and you can safely maneuver your car and apply brakes.  FOLLOW UP in our   office Please call [facility] at [phone] to set up an appointment to see your surgeon in the office for a follow-up appointment approximately 2 weeks after your surgery. Make sure that you call for this appointment the day you arrive home to insure a convenient appointment time.  9. If you have disability or family leave forms that need to be completed, you may have  them completed by your primary care physician's office; for return to work instructions, please ask our office staff and they will be happy to assist you in obtaining this documentation   When to call us [phone]: Poor pain control Reactions / problems with new medications (rash/itching, etc)  Fever over 101.5 F (38.5 C) Inability to urinate Nausea/vomiting Worsening swelling or bruising Continued bleeding from incision. Increased pain, redness, or drainage from the incision  The clinic staff is available to answer your questions during regular business hours (8:30am-5pm).  Please don't hesitate to call and ask to speak to one of our nurses for clinical concerns.   A surgeon from [facility] is always on call at the hospitals   If you have a medical emergency, go to the nearest emergency room or call 911.  [facility] [address], [address], [place], [place]  [address] MAIN: [phone] FAX: [phone] [email or web address]  

## 2021-07-25 NOTE — Progress Notes (Addendum)
?  Subjective ?No acute events. Feeling well. Has been up walking. No n/v. Pain controlled. Tolerating liquids without issue. No flatus/BM yet. ? ?Objective: ?Vital signs in last 24 hours: ?Temp:  [96.7 ?F (35.9 ?C)-97.8 ?F (36.6 ?C)] 96.7 ?F (35.9 ?C) (04/13 0509) ?Pulse Rate:  [57-82] 82 (04/13 0509) ?Resp:  [11-20] 15 (04/13 0509) ?BP: (101-142)/(53-84) 106/53 (04/13 0509) ?SpO2:  [93 %-100 %] 99 % (04/13 0509) ?Weight:  [68.9 kg] 68.9 kg (04/12 1219) ?Last BM Date : 07/24/21 ? ?Intake/Output from previous day: ?04/12 0701 - 04/13 0700 ?In: 532.5 [I.V.:532.5] ?Out: 770 [Urine:700; Emesis/NG output:50; Blood:20] ?Intake/Output this shift: ?No intake/output data recorded. ? ?Gen: NAD, comfortable ?CV: RRR ?Pulm: Normal work of breathing ?Abd: Soft, NT/ND; incisions c/d/I without erythema nor drainage. ?Ext: SCDs in place ? ?Lab Results: ?CBC  ?Recent Labs  ?  07/25/21 ?0438  ?WBC 12.7*  ?HGB 12.2  ?HCT 36.0  ?PLT 211  ? ?BMET ?Recent Labs  ?  07/25/21 ?0438  ?NA 136  ?K 4.2  ?CL 106  ?CO2 23  ?GLUCOSE 131*  ?BUN 13  ?CREATININE 0.98  ?CALCIUM 8.4*  ? ?PT/INR ?No results for input(s): LABPROT, INR in the last 72 hours. ?ABG ?No results for input(s): PHART, HCO3 in the last 72 hours. ? ?Invalid input(s): PCO2, PO2 ? ?Studies/Results: ? ?Anti-infectives: ?Anti-infectives (From admission, onward)  ? ? Start     Dose/Rate Route Frequency Ordered Stop  ? 07/24/21 1400  neomycin (MYCIFRADIN) tablet 1,000 mg  Status:  Discontinued       ?See Hyperspace for full Linked Orders Report.  ? 1,000 mg Oral 3 times per day 07/24/21 1201 07/24/21 1756  ? 07/24/21 1400  metroNIDAZOLE (FLAGYL) tablet 1,000 mg  Status:  Discontinued       ?See Hyperspace for full Linked Orders Report.  ? 1,000 mg Oral 3 times per day 07/24/21 1201 07/24/21 1756  ? 07/24/21 1215  cefoTEtan (CEFOTAN) 2 g in sodium chloride 0.9 % 100 mL IVPB       ? 2 g ?200 mL/hr over 30 Minutes Intravenous On call to O.R. 07/24/21 1201 07/24/21 1403  ? ?   ? ? ? ?Assessment/Plan: ?Patient Active Problem List  ? Diagnosis Date Noted  ? Colon cancer ([place]) 07/24/2021  ? PAF (paroxysmal atrial fibrillation) ([place]) 08/17/2020  ? Rapid heart rate 09/23/2019  ? Essential hypertension 09/23/2019  ? Unspecified constipation 09/22/2012  ? Syncope 09/02/2012  ? Elevated troponin 09/02/2012  ? Thrombocytopenia ([place]) 09/02/2012  ? Neutropenia, febrile ([place]) 09/02/2012  ? Hypothyroidism 09/02/2012  ? Dyslipidemia 09/02/2012  ? Colitis 09/02/2012  ? Breast cancer of lower-inner quadrant of right female breast ([place]) 07/01/2012  ? ?s/p Procedure(s): ?LAPAROSCOPIC RIGHT HEMI COLECTOMY 07/24/2021 ? ?-Doing great ?-Home meds ?-PT ?-D/C MIVF ?-Adv to soft diet as tolerated ?-Continue entereg today ?-Ppx: SQH, SCDs ?-Restart Eliquis tomorrow if hgb stable ? ?Spent time reviewing her procedure, findings and plans with her and her son at bedside, Barnabas Lister, today. All questions answered. ? LOS: 1 day  ? ?Nadeen Landau, MD FACS ?[facility] ? ?

## 2021-07-26 LAB — CBC
HCT: 31.1 % — ABNORMAL LOW (ref 36.0–46.0)
Hemoglobin: 10.4 g/dL — ABNORMAL LOW (ref 12.0–15.0)
MCH: 30.7 pg (ref 26.0–34.0)
MCHC: 33.4 g/dL (ref 30.0–36.0)
MCV: 91.7 fL (ref 80.0–100.0)
Platelets: 189 10*3/uL (ref 150–400)
RBC: 3.39 MIL/uL — ABNORMAL LOW (ref 3.87–5.11)
RDW: 13.4 % (ref 11.5–15.5)
WBC: 13.2 10*3/uL — ABNORMAL HIGH (ref 4.0–10.5)
nRBC: 0 % (ref 0.0–0.2)

## 2021-07-26 LAB — BASIC METABOLIC PANEL
Anion gap: 5 (ref 5–15)
BUN: 11 mg/dL (ref 8–23)
CO2: 24 mmol/L (ref 22–32)
Calcium: 8.3 mg/dL — ABNORMAL LOW (ref 8.9–10.3)
Chloride: 106 mmol/L (ref 98–111)
Creatinine, Ser: 0.88 mg/dL (ref 0.44–1.00)
GFR, Estimated: >60 mL/min (ref >60)
Glucose, Bld: 93 mg/dL (ref 70–99)
Potassium: 3.6 mmol/L (ref 3.5–5.1)
Sodium: 135 mmol/L (ref 135–145)

## 2021-07-26 MED ORDER — LORATADINE 10 MG PO TABS
10.0000 mg | ORAL_TABLET | Freq: Every day | ORAL | Status: DC
  Administered 2021-07-26: 10 mg via ORAL
  Filled 2021-07-26: qty 1

## 2021-07-26 MED ORDER — B COMPLEX-C PO TABS
1.0000 | ORAL_TABLET | Freq: Every day | ORAL | Status: DC
  Administered 2021-07-26: 1 via ORAL
  Filled 2021-07-26 (×2): qty 1

## 2021-07-26 MED ORDER — ASCORBIC ACID 500 MG PO TABS
1000.0000 mg | ORAL_TABLET | Freq: Every day | ORAL | Status: DC
  Administered 2021-07-26: 1000 mg via ORAL
  Filled 2021-07-26: qty 2

## 2021-07-26 MED ORDER — FLUTICASONE PROPIONATE 50 MCG/ACT NA SUSP
1.0000 | Freq: Every day | NASAL | Status: DC | PRN
  Filled 2021-07-26: qty 16

## 2021-07-26 MED ORDER — VITAMIN D3 25 MCG (1000 UNIT) PO TABS
2000.0000 [IU] | ORAL_TABLET | ORAL | Status: DC
  Administered 2021-07-26: 2000 [IU] via ORAL

## 2021-07-26 MED ORDER — LEVOTHYROXINE SODIUM 75 MCG PO TABS: 37.5000 ug | ORAL_TABLET | ORAL | Status: DC

## 2021-07-26 MED ORDER — HAIR/SKIN/NAILS PO TABS: 1.0000 | ORAL_TABLET | Freq: Every day | ORAL | Status: DC

## 2021-07-26 MED ORDER — VALACYCLOVIR HCL 500 MG PO TABS
1000.0000 mg | ORAL_TABLET | Freq: Two times a day (BID) | ORAL | Status: DC | PRN
  Filled 2021-07-26: qty 2

## 2021-07-26 MED ORDER — CALCIUM CARBONATE 1250 (500 CA) MG PO TABS
1200.0000 mg | ORAL_TABLET | Freq: Two times a day (BID) | ORAL | Status: DC
  Administered 2021-07-26 – 2021-07-27 (×3): 1250 mg via ORAL
  Filled 2021-07-26 (×3): qty 1

## 2021-07-26 MED ORDER — RISAQUAD PO CAPS
1.0000 | ORAL_CAPSULE | Freq: Every day | ORAL | Status: DC
  Administered 2021-07-26: 1 via ORAL
  Filled 2021-07-26: qty 1

## 2021-07-26 MED ORDER — METOPROLOL SUCCINATE ER 25 MG PO TB24
25.0000 mg | ORAL_TABLET | Freq: Every day | ORAL | Status: DC
Start: 2021-07-26 — End: 2021-07-27
  Administered 2021-07-26: 25 mg via ORAL
  Filled 2021-07-26 (×2): qty 1

## 2021-07-26 MED ORDER — LEVOTHYROXINE SODIUM 75 MCG PO TABS
75.0000 ug | ORAL_TABLET | ORAL | Status: DC
  Administered 2021-07-27: 75 ug via ORAL
  Filled 2021-07-26: qty 1

## 2021-07-26 MED ORDER — ROSUVASTATIN CALCIUM 10 MG PO TABS
10.0000 mg | ORAL_TABLET | Freq: Every day | ORAL | Status: DC
Start: 2021-07-26 — End: 2021-07-27
  Administered 2021-07-26: 10 mg via ORAL
  Filled 2021-07-26: qty 1

## 2021-07-26 MED ORDER — POTASSIUM CHLORIDE CRYS ER 20 MEQ PO TBCR
20.0000 meq | EXTENDED_RELEASE_TABLET | Freq: Once | ORAL | Status: AC
  Administered 2021-07-26: 20 meq via ORAL
  Filled 2021-07-26: qty 1

## 2021-07-26 MED ORDER — FAMOTIDINE 20 MG PO TABS
20.0000 mg | ORAL_TABLET | Freq: Two times a day (BID) | ORAL | Status: DC
  Administered 2021-07-26 (×2): 20 mg via ORAL
  Filled 2021-07-26 (×2): qty 1

## 2021-07-26 NOTE — Evaluation (Addendum)
Physical Therapy Evaluation ?Patient Details ?Name: Shelia Obrien ?MRN: [id number] ?DOB: 1940-04-30 ?Today's Date: 07/26/2021 ? ?History of Present Illness ? Pt s/p laparoscopic R hemi-collectomy.  Pt with hx of breast CA and hip osteoarthritis  ?Clinical Impression ? Pt admitted as above and presenting with functional mobility limitations 2* post op pain.  Pt up to ambulate in hall and demonstrates good safety awareness and balance including back stepping and side stepping.  Pt ambulated 450' but further distance ltd by abdominal pain and hip pain.  Pt eager for dc home with family assist.  PT service will sign off and defer further mobilization to nursing, family and mobility team. ?   ? ?Recommendations for follow up therapy are one component of a multi-disciplinary discharge planning process, led by the attending physician.  Recommendations may be updated based on patient status, additional functional criteria and insurance authorization. ? ?Follow Up Recommendations No PT follow up ? ?  ?Assistance Recommended at Discharge PRN  ?Patient can return home with the following ?   ? ?  ?Equipment Recommendations None recommended by PT  ?Recommendations for Other Services ?    ?  ?Functional Status Assessment Patient has had a recent decline in their functional status and demonstrates the ability to make significant improvements in function in a reasonable and predictable amount of time.  ? ?  ?Precautions / Restrictions Precautions ?Precautions: None ?Restrictions ?Weight Bearing Restrictions: No  ? ?  ? ?Mobility ? Bed Mobility ?Overal bed mobility: Modified Independent ?  ?  ?  ?  ?  ?  ?General bed mobility comments: modified roll to exit bed.  No physical assist ?  ? ?Transfers ?Overall transfer level: Needs assistance ?Equipment used: None ?Transfers: Sit to/from Stand ?Sit to Stand: Supervision ?  ?  ?  ?  ?  ?General transfer comment: no physical assist. ?  ? ?Ambulation/Gait ?Ambulation/Gait assistance: Min  guard, Supervision, Independent ?Gait Distance (Feet): 450 Feet ?Assistive device: None ?Gait Pattern/deviations: Step-through pattern, Wide base of support, Decreased step length - right, Decreased step length - left, Shuffle, Antalgic ?Gait velocity: mod pace ?  ?  ?General Gait Details: pt with mild initial instability but no LOB and improved with increased distance ambulated.  Antalgic gait related to hip osteoarthritis. ? ?Stairs ?  ?  ?  ?  ?  ? ?Wheelchair Mobility ?  ? ?Modified Rankin (Stroke Patients Only) ?  ? ?  ? ?Balance Overall balance assessment: Mild deficits observed, not formally tested ?  ?  ?  ?  ?  ?  ?  ?  ?  ?  ?  ?  ?  ?  ?  ?  ?  ?  ?   ? ? ? ?Pertinent Vitals/Pain Pain Assessment ?Pain Assessment: 0-10 ?Pain Score: 6  ?Pain Location: abdomen and L hip with activity ?Pain Descriptors / Indicators: Sore ?Pain Intervention(s): Limited activity within patient's tolerance, Monitored during session  ? ? ?Home Living Family/patient expects to be discharged to:: Private residence ?Living Arrangements: Alone ?Available Help at Discharge: Family;Available 24 hours/day ?Type of Home: House ?Home Access: Level entry ?  ?  ?  ?Home Layout: One level ?Home Equipment: Kasandra Knudsen - single point ?   ?  ?Prior Function Prior Level of Function : Independent/Modified Independent ?  ?  ?  ?  ?  ?  ?  ?  ?  ? ? ?Hand Dominance  ?   ? ?  ?Extremity/Trunk Assessment  ?  Upper Extremity Assessment ?Upper Extremity Assessment: Overall WFL for tasks assessed ?  ? ?Lower Extremity Assessment ?Lower Extremity Assessment: Overall WFL for tasks assessed ?  ? ?   ?Communication  ? Communication: No difficulties  ?Cognition Arousal/Alertness: Awake/alert ?Behavior During Therapy: [facility] for tasks assessed/performed ?Overall Cognitive Status: Within Functional Limits for tasks assessed ?  ?  ?  ?  ?  ?  ?  ?  ?  ?  ?  ?  ?  ?  ?  ?  ?  ?  ?  ? ?  ?General Comments   ? ?  ?Exercises    ? ?Assessment/Plan  ?  ?PT Assessment Patient  does not need any further PT services  ?PT Problem List Decreased activity tolerance;Pain ? ?   ?  ?PT Treatment Interventions Gait training;Functional mobility training   ? ?PT Goals (Current goals can be found in the Care Plan section)  ?Acute Rehab PT Goals ?Patient Stated Goal: Regain IND ?PT Goal Formulation: All assessment and education complete, DC therapy ? ?  ?Frequency Min 1X/week ?  ? ? ?Co-evaluation   ?  ?  ?  ?  ? ? ?  ?AM-PAC PT "6 Clicks" Mobility  ?Outcome Measure Help needed turning from your back to your side while in a flat bed without using bedrails?: None ?Help needed moving from lying on your back to sitting on the side of a flat bed without using bedrails?: None ?Help needed moving to and from a bed to a chair (including a wheelchair)?: None ?Help needed standing up from a chair using your arms (e.g., wheelchair or bedside chair)?: None ?Help needed to walk in hospital room?: None ?Help needed climbing 3-5 steps with a railing? : A Little ?6 Click Score: 23 ? ?  ?End of Session Equipment Utilized During Treatment: Gait belt ?Activity Tolerance: Patient tolerated treatment well ?Patient left: in chair;with call bell/phone within reach;with family/visitor present ?Nurse Communication: Mobility status ?PT Visit Diagnosis: Difficulty in walking, not elsewhere classified (R26.2) ?  ? ?Time: 1010-1024 ?PT Time Calculation (min) (ACUTE ONLY): 14 min ? ? ?Charges:   PT Evaluation ?$PT Eval Low Complexity: 1 Low ?  ?  ?   ? ? ?Debe Coder PT ?Acute Rehabilitation Services ?Pager [phone] ?Office [phone] ? ? ?Kahiau Schewe ?07/26/2021, 12:36 PM ? ?

## 2021-07-26 NOTE — Progress Notes (Signed)
?  Transition of Care (TOC) Screening Note ? ? ?Patient Details  ?Name: Shelia Obrien ?Date of Birth: 05-06-1940 ? ? ?Transition of Care (TOC) CM/SW Contact:    ?Theresa Wedel, LCSW ?Phone Number: ?07/26/2021, 10:55 AM ? ? ? ?Transition of Care Department [facility]) has reviewed patient and no TOC needs have been identified at this time. We will continue to monitor patient advancement through interdisciplinary progression rounds. If new patient transition needs arise, please place a TOC consult. ? ? ?

## 2021-07-27 LAB — CBC
HCT: 34.2 % — ABNORMAL LOW (ref 36.0–46.0)
Hemoglobin: 11.4 g/dL — ABNORMAL LOW (ref 12.0–15.0)
MCH: 30.8 pg (ref 26.0–34.0)
MCHC: 33.3 g/dL (ref 30.0–36.0)
MCV: 92.4 fL (ref 80.0–100.0)
Platelets: 220 10*3/uL (ref 150–400)
RBC: 3.7 MIL/uL — ABNORMAL LOW (ref 3.87–5.11)
RDW: 13.3 % (ref 11.5–15.5)
WBC: 11.1 10*3/uL — ABNORMAL HIGH (ref 4.0–10.5)
nRBC: 0 % (ref 0.0–0.2)

## 2021-07-27 LAB — BASIC METABOLIC PANEL
Anion gap: 8 (ref 5–15)
BUN: 12 mg/dL (ref 8–23)
CO2: 20 mmol/L — ABNORMAL LOW (ref 22–32)
Calcium: 8.8 mg/dL — ABNORMAL LOW (ref 8.9–10.3)
Chloride: 110 mmol/L (ref 98–111)
Creatinine, Ser: 0.88 mg/dL (ref 0.44–1.00)
GFR, Estimated: >60 mL/min (ref >60)
Glucose, Bld: 86 mg/dL (ref 70–99)
Potassium: 4.1 mmol/L (ref 3.5–5.1)
Sodium: 138 mmol/L (ref 135–145)

## 2021-07-27 NOTE — Discharge Summary (Signed)
Patient ID: ?[name] ?MRN: [id number] ?DOB/AGE: 1941-02-20 81 y.o. ? ?Admit date: 07/24/2021 ?Discharge date: 07/27/2021 ? ?Discharge Diagnoses ?Patient Active Problem List  ? Diagnosis Date Noted  ? Colon cancer ([place]) 07/24/2021  ? PAF (paroxysmal atrial fibrillation) ([place]) 08/17/2020  ? Rapid heart rate 09/23/2019  ? Essential hypertension 09/23/2019  ? Unspecified constipation 09/22/2012  ? Syncope 09/02/2012  ? Elevated troponin 09/02/2012  ? Thrombocytopenia ([place]) 09/02/2012  ? Neutropenia, febrile ([place]) 09/02/2012  ? Hypothyroidism 09/02/2012  ? Dyslipidemia 09/02/2012  ? Colitis 09/02/2012  ? Breast cancer of lower-inner quadrant of right female breast ([place]) 07/01/2012  ? ? ?Consultants ?none ? ?Procedures ?OR 07/24/21 -  ? ?Hospital Course: She was admitted postoperatively and recovered uneventfully. On 07/27/21 she is comfortable with and stable for discharge home today ? ? ?Allergies as of 07/27/2021   ?No Known Allergies ?  ? ?  ?Medication List  ?  ? ?TAKE these medications   ? ?acidophilus Caps capsule ?Take 1 capsule by mouth daily. ?  ?apixaban 5 MG Tabs tablet ?Commonly known as: Eliquis ?Take 1 tablet (5 mg total) by mouth 2 (two) times daily. ?  ?b complex vitamins tablet ?Take 1 tablet by mouth daily. ?  ?calcium carbonate 1500 (600 Ca) MG Tabs tablet ?Commonly known as: OSCAL ?Take 600 mg by mouth in the morning and at bedtime. ?  ?cetirizine 10 MG tablet ?Commonly known as: ZYRTEC ?Take 10 mg by mouth daily. ?  ?denosumab 60 MG/ML Sosy injection ?Commonly known as: PROLIA ?Inject 60 mg into the skin every 6 (six) months. ?  ?famotidine 20 MG tablet ?Commonly known as: PEPCID ?Take 1 tablet (20 mg total) by mouth 2 (two) times daily. ?  ?fluticasone 50 MCG/ACT nasal spray ?Commonly known as: FLONASE ?Place 1 spray into both nostrils daily as needed for allergies. ?  ?Hair/Skin/Nails Tabs ?Take 1 tablet by mouth daily. ?  ?levothyroxine 75 MCG tablet ?Commonly known as: SYNTHROID ?Take 37.5-75 mcg  by mouth See admin instructions. Take 75 mcg by mouth daily except on Friday take 37.5 mcg ?  ?losartan 25 MG tablet ?Commonly known as: COZAAR ?Take 25 mg by mouth daily. ?  ?metoprolol succinate 25 MG 24 hr tablet ?Commonly known as: Toprol XL ?Take 1 tablet (25 mg total) by mouth daily. ?  ?rosuvastatin 10 MG tablet ?Commonly known as: CRESTOR ?Take 10 mg by mouth daily. ?  ?traMADol 50 MG tablet ?Commonly known as: Ultram ?Take 1 tablet (50 mg total) by mouth every 6 (six) hours as needed for up to 5 days (postop pain not controlled with tylenol and ibuprofen first). ?  ?TURMERIC PO ?Take 1,000 mg by mouth daily. ?  ?valACYclovir 1000 MG tablet ?Commonly known as: VALTREX ?Take 1,000 mg by mouth 2 (two) times daily as needed (for cold sores). ?  ?vitamin C 1000 MG tablet ?Take 1,000 mg by mouth daily. ?  ?Vitamin D 50 MCG (2000 UT) tablet ?Take 1 tablet (2,000 Units total) by mouth 3 (three) times a week. ?  ? ?  ? ? ? ? Follow-up Information   ? ? Ileana Roup, MD Follow up in 2 week(s).   ?Specialties: General Surgery, Colon and Rectal Surgery ?Contact information: ?[address] ?[address] ?[place] [address] ?[phone] ? ? ?  ?  ? ?  ?  ? ?  ? ? ?Sharon Mt. Dema Severin, M.D. ?[facility] ? ?

## 2021-07-27 NOTE — Progress Notes (Signed)
Assessment unchanged. Pt verbalized understanding of dc instructions through teach back regarding medications and follow up care. ?Discharged via wc to front entrance accompanied by NT and family.  ?

## 2021-07-27 NOTE — Plan of Care (Signed)

## 2021-07-27 NOTE — Progress Notes (Addendum)
?Subjective ?No acute events. Feeling well. Has been up walking. No n/v. Pain controlled. Tolerating soft diet. Having flatus and Bms now - green in color, no apparent blood ? ?Objective: ?Vital signs in last 24 hours: ?Temp:  [98.4 ?F (36.9 ?C)-99.5 ?F (37.5 ?C)] 99.5 ?F (37.5 ?C) (04/15 7673) ?Pulse Rate:  [69-75] 75 (04/15 0553) ?Resp:  [16-18] 16 (04/15 0553) ?BP: (117-143)/(64-71) 143/71 (04/15 0553) ?SpO2:  [98 %-99 %] 98 % (04/15 0553) ?Weight:  [67.4 kg] 67.4 kg (04/15 0553) ?Last BM Date : 07/27/21 ? ?Intake/Output from previous day: ?04/14 0701 - 04/15 0700 ?In: 360 [P.O.:360] ?Out: 600 [Urine:600] ?Intake/Output this shift: ?No intake/output data recorded. ? ?Gen: NAD, comfortable ?CV: RRR ?Pulm: Normal work of breathing ?Abd: Soft, NT/ND; incisions c/d/I without erythema nor drainage. ?Ext: SCDs in place ? ?Lab Results: ?CBC  ?Recent Labs  ?  07/26/21 ?4193 07/27/21 ?7902  ?WBC 13.2* 11.1*  ?HGB 10.4* 11.4*  ?HCT 31.1* 34.2*  ?PLT 189 220  ? ?BMET ?Recent Labs  ?  07/26/21 ?4097 07/27/21 ?3532  ?NA 135 138  ?K 3.6 4.1  ?CL 106 110  ?CO2 24 20*  ?GLUCOSE 93 86  ?BUN 11 12  ?CREATININE 0.88 0.88  ?CALCIUM 8.3* 8.8*  ? ?PT/INR ?No results for input(s): LABPROT, INR in the last 72 hours. ?ABG ?No results for input(s): PHART, HCO3 in the last 72 hours. ? ?Invalid input(s): PCO2, PO2 ? ?Studies/Results: ? ?Anti-infectives: ?Anti-infectives (From admission, onward)  ? ? Start     Dose/Rate Route Frequency Ordered Stop  ? 07/26/21 0825  valACYclovir (VALTREX) tablet 1,000 mg       ? 1,000 mg Oral 2 times daily PRN 07/26/21 0826    ? 07/24/21 1400  neomycin (MYCIFRADIN) tablet 1,000 mg  Status:  Discontinued       ?See Hyperspace for full Linked Orders Report.  ? 1,000 mg Oral 3 times per day 07/24/21 1201 07/24/21 1756  ? 07/24/21 1400  metroNIDAZOLE (FLAGYL) tablet 1,000 mg  Status:  Discontinued       ?See Hyperspace for full Linked Orders Report.  ? 1,000 mg Oral 3 times per day 07/24/21 1201 07/24/21 1756   ? 07/24/21 1215  cefoTEtan (CEFOTAN) 2 g in sodium chloride 0.9 % 100 mL IVPB       ? 2 g ?200 mL/hr over 30 Minutes Intravenous On call to O.R. 07/24/21 1201 07/24/21 1403  ? ?  ? ? ? ?Assessment/Plan: ?Patient Active Problem List  ? Diagnosis Date Noted  ? Colon cancer ([place]) 07/24/2021  ? PAF (paroxysmal atrial fibrillation) ([place]) 08/17/2020  ? Rapid heart rate 09/23/2019  ? Essential hypertension 09/23/2019  ? Unspecified constipation 09/22/2012  ? Syncope 09/02/2012  ? Elevated troponin 09/02/2012  ? Thrombocytopenia ([place]) 09/02/2012  ? Neutropenia, febrile ([place]) 09/02/2012  ? Hypothyroidism 09/02/2012  ? Dyslipidemia 09/02/2012  ? Colitis 09/02/2012  ? Breast cancer of lower-inner quadrant of right female breast ([place]) 07/01/2012  ? ?s/p Procedure(s): ?LAPAROSCOPIC RIGHT HEMI COLECTOMY 07/24/2021 ? ?-Doing great ?-Hgb increased today ?-Discussed monitoring another day and restarting Eliquis in house vs discharge home with supervision from her daughter and restarting Eliquis tonight. We discussed things to watch out for including red/maroon stool, weakness/fatigue, nausea/vomiting, fever/chills. They expressed understanding and are comfortabe with discharge home today with plans to restart Eliquis tonight ? 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 Reviewed this with her today and discussed long-term plans for surveillance as well. ? ?She has follow-up in my office  arranged ? ?-Ppx: SQH, SCDs ? ? LOS: 3 days  ? ?Nadeen Landau, MD FACS ?[facility] ? ?

## 2021-07-28 ENCOUNTER — Encounter (HOSPITAL_COMMUNITY): Payer: Self-pay | Admitting: Emergency Medicine

## 2021-07-28 ENCOUNTER — Other Ambulatory Visit: Payer: Self-pay

## 2021-07-28 ENCOUNTER — Inpatient Hospital Stay (HOSPITAL_COMMUNITY)
Admission: EM | Admit: 2021-07-28 | Discharge: 2021-07-30 | DRG: 379 | Disposition: A | Discharge status: Home / Self Care | Payer: Medicare HMO | Attending: Surgery | Admitting: Surgery

## 2021-07-28 DIAGNOSIS — Z9049 Acquired absence of other specified parts of digestive tract: Principal | ICD-10-CM

## 2021-07-28 DIAGNOSIS — K625 Hemorrhage of anus and rectum: Secondary | ICD-10-CM | POA: Diagnosis not present

## 2021-07-28 DIAGNOSIS — E039 Hypothyroidism, unspecified: Secondary | ICD-10-CM | POA: Diagnosis not present

## 2021-07-28 DIAGNOSIS — E785 Hyperlipidemia, unspecified: Secondary | ICD-10-CM | POA: Diagnosis not present

## 2021-07-28 DIAGNOSIS — C189 Malignant neoplasm of colon, unspecified: Secondary | ICD-10-CM | POA: Diagnosis not present

## 2021-07-28 DIAGNOSIS — Z7901 Long term (current) use of anticoagulants: Secondary | ICD-10-CM

## 2021-07-28 DIAGNOSIS — I480 Paroxysmal atrial fibrillation: Secondary | ICD-10-CM | POA: Diagnosis not present

## 2021-07-28 DIAGNOSIS — Z20822 Contact with and (suspected) exposure to covid-19: Secondary | ICD-10-CM | POA: Diagnosis not present

## 2021-07-28 DIAGNOSIS — I10 Essential (primary) hypertension: Secondary | ICD-10-CM | POA: Diagnosis present

## 2021-07-28 DIAGNOSIS — K649 Unspecified hemorrhoids: Secondary | ICD-10-CM | POA: Diagnosis not present

## 2021-07-28 DIAGNOSIS — Z85038 Personal history of other malignant neoplasm of large intestine: Secondary | ICD-10-CM

## 2021-07-28 DIAGNOSIS — Z853 Personal history of malignant neoplasm of breast: Secondary | ICD-10-CM

## 2021-07-28 LAB — COMPREHENSIVE METABOLIC PANEL
ALT: 22 U/L (ref 0–44)
AST: 32 U/L (ref 15–41)
Albumin: 3.4 g/dL — ABNORMAL LOW (ref 3.5–5.0)
Alkaline Phosphatase: 47 U/L (ref 38–126)
Anion gap: 10 (ref 5–15)
BUN: 25 mg/dL — ABNORMAL HIGH (ref 8–23)
CO2: 19 mmol/L — ABNORMAL LOW (ref 22–32)
Calcium: 9 mg/dL (ref 8.9–10.3)
Chloride: 109 mmol/L (ref 98–111)
Creatinine, Ser: 1.12 mg/dL — ABNORMAL HIGH (ref 0.44–1.00)
GFR, Estimated: 49 mL/min — ABNORMAL LOW (ref >60)
Glucose, Bld: 93 mg/dL (ref 70–99)
Potassium: 4.3 mmol/L (ref 3.5–5.1)
Sodium: 138 mmol/L (ref 135–145)
Total Bilirubin: 0.9 mg/dL (ref 0.3–1.2)
Total Protein: 6.6 g/dL (ref 6.5–8.1)

## 2021-07-28 LAB — CBC
HCT: 34 % — ABNORMAL LOW (ref 36.0–46.0)
Hemoglobin: 11.4 g/dL — ABNORMAL LOW (ref 12.0–15.0)
MCH: 30.6 pg (ref 26.0–34.0)
MCHC: 33.5 g/dL (ref 30.0–36.0)
MCV: 91.2 fL (ref 80.0–100.0)
Platelets: 276 10*3/uL (ref 150–400)
RBC: 3.73 MIL/uL — ABNORMAL LOW (ref 3.87–5.11)
RDW: 13.2 % (ref 11.5–15.5)
WBC: 9.2 10*3/uL (ref 4.0–10.5)
nRBC: 0.2 % (ref 0.0–0.2)

## 2021-07-28 LAB — TYPE AND SCREEN
ABO/RH(D): A POS
Antibody Screen: NEGATIVE

## 2021-07-28 LAB — RESP PANEL BY RT-PCR (FLU A&B, COVID) ARPGX2
Influenza A by PCR: NEGATIVE
Influenza B by PCR: NEGATIVE
SARS Coronavirus 2 by RT PCR: NEGATIVE

## 2021-07-28 MED ORDER — FLUTICASONE PROPIONATE 50 MCG/ACT NA SUSP: 1.0000 | Freq: Every day | NASAL | Status: DC | PRN

## 2021-07-28 MED ORDER — LEVOTHYROXINE SODIUM 25 MCG PO TABS: 37.5000 ug | ORAL_TABLET | ORAL | Status: DC

## 2021-07-28 MED ORDER — LORATADINE 10 MG PO TABS
10.0000 mg | ORAL_TABLET | Freq: Every day | ORAL | Status: DC
  Administered 2021-07-29 – 2021-07-30 (×2): 10 mg via ORAL
  Filled 2021-07-28 (×2): qty 1

## 2021-07-28 MED ORDER — CALCIUM CARBONATE 1250 (500 CA) MG PO TABS
500.0000 mg | ORAL_TABLET | Freq: Two times a day (BID) | ORAL | Status: DC
  Administered 2021-07-29 – 2021-07-30 (×3): 500 mg via ORAL
  Filled 2021-07-28 (×3): qty 1

## 2021-07-28 MED ORDER — LEVOTHYROXINE SODIUM 75 MCG PO TABS
37.5000 ug | ORAL_TABLET | Freq: Every day | ORAL | Status: DC
  Filled 2021-07-28: qty 1

## 2021-07-28 MED ORDER — B COMPLEX PO TABS: 1.0000 | ORAL_TABLET | Freq: Every day | ORAL | Status: DC

## 2021-07-28 MED ORDER — ROSUVASTATIN CALCIUM 10 MG PO TABS
10.0000 mg | ORAL_TABLET | Freq: Every day | ORAL | Status: DC
  Administered 2021-07-29 – 2021-07-30 (×2): 10 mg via ORAL
  Filled 2021-07-28 (×2): qty 1

## 2021-07-28 MED ORDER — VITAMIN D 25 MCG (1000 UNIT) PO TABS
2000.0000 [IU] | ORAL_TABLET | ORAL | Status: DC
  Administered 2021-07-29: 2000 [IU] via ORAL
  Filled 2021-07-28: qty 2

## 2021-07-28 MED ORDER — LEVOTHYROXINE SODIUM 75 MCG PO TABS: 37.5000 ug | ORAL_TABLET | ORAL | Status: DC

## 2021-07-28 MED ORDER — DIPHENHYDRAMINE HCL 50 MG/ML IJ SOLN: 12.5000 mg | Freq: Four times a day (QID) | INTRAMUSCULAR | Status: DC | PRN

## 2021-07-28 MED ORDER — METOPROLOL SUCCINATE ER 25 MG PO TB24
25.0000 mg | ORAL_TABLET | Freq: Every day | ORAL | Status: DC
  Administered 2021-07-29 – 2021-07-30 (×2): 25 mg via ORAL
  Filled 2021-07-28 (×2): qty 1

## 2021-07-28 MED ORDER — HYDROMORPHONE HCL 1 MG/ML IJ SOLN: 0.5000 mg | INTRAMUSCULAR | Status: DC | PRN

## 2021-07-28 MED ORDER — ONDANSETRON 4 MG PO TBDP: 4.0000 mg | ORAL_TABLET | Freq: Four times a day (QID) | ORAL | Status: DC | PRN

## 2021-07-28 MED ORDER — SIMETHICONE 80 MG PO CHEW
40.0000 mg | CHEWABLE_TABLET | Freq: Four times a day (QID) | ORAL | Status: DC | PRN
  Filled 2021-07-28: qty 1

## 2021-07-28 MED ORDER — ACETAMINOPHEN 500 MG PO TABS
1000.0000 mg | ORAL_TABLET | Freq: Four times a day (QID) | ORAL | Status: DC | PRN
Start: 2021-07-28 — End: 2021-07-30

## 2021-07-28 MED ORDER — HYDRALAZINE HCL 20 MG/ML IJ SOLN: 10.0000 mg | INTRAMUSCULAR | Status: DC | PRN

## 2021-07-28 MED ORDER — LACTATED RINGERS IV SOLN: INTRAVENOUS | Status: DC

## 2021-07-28 MED ORDER — DIPHENHYDRAMINE HCL 12.5 MG/5ML PO ELIX: 12.5000 mg | ORAL_SOLUTION | Freq: Four times a day (QID) | ORAL | Status: DC | PRN

## 2021-07-28 MED ORDER — B COMPLEX-C PO TABS
1.0000 | ORAL_TABLET | Freq: Every day | ORAL | Status: DC
  Administered 2021-07-29 – 2021-07-30 (×2): 1 via ORAL
  Filled 2021-07-28 (×2): qty 1

## 2021-07-28 MED ORDER — ONDANSETRON HCL 4 MG/2ML IJ SOLN: 4.0000 mg | Freq: Four times a day (QID) | INTRAMUSCULAR | Status: DC | PRN

## 2021-07-28 MED ORDER — ASCORBIC ACID 500 MG PO TABS: 1000.0000 mg | ORAL_TABLET | Freq: Every day | ORAL | Status: DC

## 2021-07-28 MED ORDER — LOSARTAN POTASSIUM 25 MG PO TABS
25.0000 mg | ORAL_TABLET | Freq: Every day | ORAL | Status: DC
  Administered 2021-07-29 – 2021-07-30 (×2): 25 mg via ORAL
  Filled 2021-07-28 (×2): qty 1

## 2021-07-28 MED ORDER — TRAMADOL HCL 50 MG PO TABS: 50.0000 mg | ORAL_TABLET | Freq: Four times a day (QID) | ORAL | Status: DC | PRN

## 2021-07-28 MED ORDER — FAMOTIDINE 20 MG PO TABS
20.0000 mg | ORAL_TABLET | Freq: Two times a day (BID) | ORAL | Status: DC
  Administered 2021-07-29 – 2021-07-30 (×3): 20 mg via ORAL
  Filled 2021-07-28 (×3): qty 1

## 2021-07-28 NOTE — Plan of Care (Signed)
  Problem: Education: Goal: Knowledge of General Education information will improve Description Including pain rating scale, medication(s)/side effects and non-pharmacologic comfort measures Outcome: Progressing   Problem: Health Behavior/Discharge Planning: Goal: Ability to manage health-related needs will improve Outcome: Progressing   

## 2021-07-28 NOTE — H&P (Signed)
?  Subjective ?Well known to me. Restarted Eliquis last night. She called me today with onset of some drops of blood on tissue paper that she felt were hemorrhoid bleeding. This progressed this afternoon to where she has had blood admixed with stool. Good appetite. No n/v. No fever/chills. No distention/bloating. ? ?Daughter showed me a photo on her cell of a toilet hat with thin red blood in basin from this evening ? ?Objective: ?Vital signs in last 24 hours: ?Temp:  [98.3 ?F (36.8 ?C)] 98.3 ?F (36.8 ?C) (04/16 1839) ?Pulse Rate:  [78-88] 78 (04/16 2130) ?Resp:  [16-18] 16 (04/16 2130) ?BP: (129-133)/(68-71) 133/71 (04/16 2130) ?SpO2:  [98 %-100 %] 100 % (04/16 2130) ?  ? ?Intake/Output from previous day: ?No intake/output data recorded. ?Intake/Output this shift: ?No intake/output data recorded. ? ?Gen: NAD, comfortable ?CV: RRR ?Pulm: Normal work of breathing ?Abd: Soft, NT/ND. Some ecchymosis around extraction site. ?Ext: SCDs in place ? ?Lab Results: ?CBC  ?Recent Labs  ?  07/27/21 ?1610 07/28/21 ?1902  ?WBC 11.1* 9.2  ?HGB 11.4* 11.4*  ?HCT 34.2* 34.0*  ?PLT 220 276  ? ?BMET ?Recent Labs  ?  07/27/21 ?9604 07/28/21 ?1902  ?NA 138 138  ?K 4.1 4.3  ?CL 110 109  ?CO2 20* 19*  ?GLUCOSE 86 93  ?BUN 12 25*  ?CREATININE 0.88 1.12*  ?CALCIUM 8.8* 9.0  ? ?PT/INR ?No results for input(s): LABPROT, INR in the last 72 hours. ?ABG ?No results for input(s): PHART, HCO3 in the last 72 hours. ? ?Invalid input(s): PCO2, PO2 ? ?Studies/Results: ? ?Anti-infectives: ?Anti-infectives (From admission, onward)  ? ? None  ? ?  ? ? ? ?Assessment/Plan: ?Patient Active Problem List  ? Diagnosis Date Noted  ? Colon cancer ([place]) 07/24/2021  ? PAF (paroxysmal atrial fibrillation) ([place]) 08/17/2020  ? Rapid heart rate 09/23/2019  ? Essential hypertension 09/23/2019  ? Unspecified constipation 09/22/2012  ? Syncope 09/02/2012  ? Elevated troponin 09/02/2012  ? Thrombocytopenia ([place]) 09/02/2012  ? Neutropenia, febrile ([place]) 09/02/2012  ?  Hypothyroidism 09/02/2012  ? Dyslipidemia 09/02/2012  ? Colitis 09/02/2012  ? Breast cancer of lower-inner quadrant of right female breast ([place]) 07/01/2012  ? ?80yoF hx afib (on Eliquis), HTN, hypothyroidism - underwent laparoscopic right hemicolectomy 07/24/21 - now with presumed anastomotic bleed following resumption of Eliquis ? ?- Admit to ward ?- Heart healthy diet ?- MIVF ?- Recheck hgb in AM ?- Ppx: SCDs; hold NSAIDs and all chemical dvt prophylaxis at present given bleeding. Hold Eliquis ?- Discussed all the above with her and her daughter at bedside. They expressed understanding and agreement with the plan ? ? LOS: 0 days  ? ?Nadeen Landau, MD FACS ?[facility] ? ?

## 2021-07-28 NOTE — ED Triage Notes (Signed)
Patient reports d/c after admission for colectomy resulting from colon cancer. States yesterday afternoon started having rectal bleeding with bright red blood in stool.  ?

## 2021-07-28 NOTE — ED Provider Notes (Signed)
Patient being be a direct admit by surgery ?  ?Shelia Leigh, MD ?07/28/21 2148 ? ?

## 2021-07-29 LAB — CBC WITH DIFFERENTIAL/PLATELET
Abs Immature Granulocytes: 0.06 10*3/uL (ref 0.00–0.07)
Basophils Absolute: 0.1 10*3/uL (ref 0.0–0.1)
Basophils Relative: 1 %
Eosinophils Absolute: 0.3 10*3/uL (ref 0.0–0.5)
Eosinophils Relative: 4 %
HCT: 31.2 % — ABNORMAL LOW (ref 36.0–46.0)
Hemoglobin: 10.3 g/dL — ABNORMAL LOW (ref 12.0–15.0)
Immature Granulocytes: 1 %
Lymphocytes Relative: 32 %
Lymphs Abs: 2.3 10*3/uL (ref 0.7–4.0)
MCH: 30.7 pg (ref 26.0–34.0)
MCHC: 33 g/dL (ref 30.0–36.0)
MCV: 93.1 fL (ref 80.0–100.0)
Monocytes Absolute: 1 10*3/uL (ref 0.1–1.0)
Monocytes Relative: 13 %
Neutro Abs: 3.5 10*3/uL (ref 1.7–7.7)
Neutrophils Relative %: 49 %
Platelets: 249 10*3/uL (ref 150–400)
RBC: 3.35 MIL/uL — ABNORMAL LOW (ref 3.87–5.11)
RDW: 13.5 % (ref 11.5–15.5)
WBC: 7.2 10*3/uL (ref 4.0–10.5)
nRBC: 0 % (ref 0.0–0.2)

## 2021-07-29 LAB — BASIC METABOLIC PANEL
Anion gap: 10 (ref 5–15)
BUN: 21 mg/dL (ref 8–23)
CO2: 18 mmol/L — ABNORMAL LOW (ref 22–32)
Calcium: 8.4 mg/dL — ABNORMAL LOW (ref 8.9–10.3)
Chloride: 110 mmol/L (ref 98–111)
Creatinine, Ser: 0.98 mg/dL (ref 0.44–1.00)
GFR, Estimated: 58 mL/min — ABNORMAL LOW (ref >60)
Glucose, Bld: 75 mg/dL (ref 70–99)
Potassium: 3.8 mmol/L (ref 3.5–5.1)
Sodium: 138 mmol/L (ref 135–145)

## 2021-07-29 MED ORDER — LEVOTHYROXINE SODIUM 75 MCG PO TABS
75.0000 ug | ORAL_TABLET | ORAL | Status: DC
  Administered 2021-07-29 – 2021-07-30 (×2): 75 ug via ORAL
  Filled 2021-07-29: qty 1

## 2021-07-29 MED ORDER — LEVOTHYROXINE SODIUM 75 MCG PO TABS: 37.5000 ug | ORAL_TABLET | ORAL | Status: DC

## 2021-07-29 NOTE — Progress Notes (Signed)
Transition of Care (TOC) Screening Note ? ?Patient Details  ?Name: Shelia Obrien ?Date of Birth: 07-03-1940 ? ?Transition of Care (TOC) CM/SW Contact:    ?Sherie Don, LCSW ?Phone Number: ?07/29/2021, 9:21 AM ? ?Transition of Care Department [facility]) has reviewed patient and no TOC needs have been identified at this time. We will continue to monitor patient advancement through interdisciplinary progression rounds. If new patient transition needs arise, please place a TOC consult. ?

## 2021-07-29 NOTE — Progress Notes (Signed)
?  Subjective ?Uneventful night - had 2 bm shortly after admit none since - now been several hours. No n/v/abdominal pain. ? ? ?Objective: ?Vital signs in last 24 hours: ?Temp:  [98.3 ?F (36.8 ?C)-98.5 ?F (36.9 ?C)] 98.5 ?F (36.9 ?C) (04/17 3013) ?Pulse Rate:  [78-88] 79 (04/17 0311) ?Resp:  [16-18] 16 (04/17 0311) ?BP: (129-155)/(59-79) 132/59 (04/17 0311) ?SpO2:  [97 %-100 %] 98 % (04/17 0311) ?Weight:  [68 kg] 68 kg (04/16 2312) ?Last BM Date : 07/28/21 ? ?Intake/Output from previous day: ?04/16 0701 - 04/17 0700 ?In: 870.3 [P.O.:240; I.V.:630.3] ?Out: -  ?Intake/Output this shift: ?Total I/O ?In: 870.3 [P.O.:240; I.V.:630.3] ?Out: -  ? ?Gen: NAD, comfortable ?CV: RRR ?Pulm: Normal work of breathing ?Abd: Soft, NT/ND. Some ecchymosis around extraction site. ?Ext: SCDs in place ? ?Lab Results: ?CBC  ?Recent Labs  ?  07/27/21 ?1438 07/28/21 ?1902  ?WBC 11.1* 9.2  ?HGB 11.4* 11.4*  ?HCT 34.2* 34.0*  ?PLT 220 276  ? ?BMET ?Recent Labs  ?  07/27/21 ?8875 07/28/21 ?1902  ?NA 138 138  ?K 4.1 4.3  ?CL 110 109  ?CO2 20* 19*  ?GLUCOSE 86 93  ?BUN 12 25*  ?CREATININE 0.88 1.12*  ?CALCIUM 8.8* 9.0  ? ?PT/INR ?No results for input(s): LABPROT, INR in the last 72 hours. ?ABG ?No results for input(s): PHART, HCO3 in the last 72 hours. ? ?Invalid input(s): PCO2, PO2 ? ?Studies/Results: ? ?Anti-infectives: ?Anti-infectives (From admission, onward)  ? ? None  ? ?  ? ? ? ?Assessment/Plan: ?Patient Active Problem List  ? Diagnosis Date Noted  ? S/P right hemicolectomy 07/28/2021  ? Colon cancer ([place]) 07/24/2021  ? PAF (paroxysmal atrial fibrillation) ([place]) 08/17/2020  ? Rapid heart rate 09/23/2019  ? Essential hypertension 09/23/2019  ? Unspecified constipation 09/22/2012  ? Syncope 09/02/2012  ? Elevated troponin 09/02/2012  ? Thrombocytopenia ([place]) 09/02/2012  ? Neutropenia, febrile ([place]) 09/02/2012  ? Hypothyroidism 09/02/2012  ? Dyslipidemia 09/02/2012  ? Colitis 09/02/2012  ? Breast cancer of lower-inner quadrant of right female  breast ([place]) 07/01/2012  ? ?80yoF hx afib (on Eliquis), HTN, hypothyroidism - underwent laparoscopic right hemicolectomy 07/24/21 - now with presumed anastomotic bleed following resumption of Eliquis ? ?- Heart healthy diet ?- MIVF ?- Recheck hgb in AM ?- Ppx: SCDs; hold NSAIDs and all chemical dvt prophylaxis at present given bleeding. Hold Eliquis ? ? LOS: 1 day  ? ?Nadeen Landau, MD FACS ?[facility] ? ?

## 2021-07-30 LAB — CBC
HCT: 31.6 % — ABNORMAL LOW (ref 36.0–46.0)
Hemoglobin: 10.6 g/dL — ABNORMAL LOW (ref 12.0–15.0)
MCH: 30.4 pg (ref 26.0–34.0)
MCHC: 33.5 g/dL (ref 30.0–36.0)
MCV: 90.5 fL (ref 80.0–100.0)
Platelets: 261 10*3/uL (ref 150–400)
RBC: 3.49 MIL/uL — ABNORMAL LOW (ref 3.87–5.11)
RDW: 13.6 % (ref 11.5–15.5)
WBC: 8.2 10*3/uL (ref 4.0–10.5)
nRBC: 0 % (ref 0.0–0.2)

## 2021-07-30 NOTE — Discharge Instructions (Signed)
POST OP INSTRUCTIONS AFTER COLON SURGERY ? ?DIET: Be sure to include lots of fluids daily to stay hydrated - 64oz of water per day (8, 8 oz glasses).  Avoid fast food or heavy meals for the first couple of weeks as your are more likely to get nauseated. Avoid raw/uncooked fruits or vegetables for the first 4 weeks (its ok to have these if they are blended into smoothie form). If you have fruits/vegetables, make sure they are cooked until soft enough to mash on the roof of your mouth and chew your food well. Otherwise, diet as tolerated. ? ?Take your usually prescribed home medications unless otherwise directed. ? ?PAIN CONTROL: ?Pain is best controlled by a usual combination of three different methods TOGETHER: ?Ice/Heat ?Over the counter pain medication ?Prescription pain medication ?Most patients will experience some swelling and bruising around the surgical site.  Ice packs or heating pads (30-60 minutes up to 6 times a day) will help. Some people prefer to use ice alone, heat alone, alternating between ice & heat.  Experiment to what works for you.  Swelling and bruising can take several weeks to resolve.   ?It is helpful to take an over-the-counter pain medication regularly for the first few weeks: ?Ibuprofen (Motrin/Advil) - '200mg'$  tabs - take 3 tabs ('600mg'$ ) every 6 hours as needed for pain (unless you have been directed previously to avoid NSAIDs/ibuprofen) ?Acetaminophen (Tylenol) - you may take '650mg'$  every 6 hours as needed. You can take this with motrin as they act differently on the body. If you are taking a narcotic pain medication that has acetaminophen in it, do not take over the counter tylenol at the same time. ?NOTE: You may take both of these medications together - most patients  find it most helpful when alternating between the two (i.e. Ibuprofen at 6am, tylenol at 9am, ibuprofen at 12pm ...) ?A  prescription for pain medication should be given to you upon discharge.  Take your pain medication as  prescribed if your pain is not adequatly controlled with the over-the-counter pain reliefs mentioned above. ? ?Avoid getting constipated.  Between the surgery and the pain medications, it is common to experience some constipation.  Increasing fluid intake and taking a fiber supplement (such as Metamucil, Citrucel, FiberCon, MiraLax, etc) 1-2 times a day regularly will usually help prevent this problem from occurring.  A mild laxative (prune juice, Milk of Magnesia, MiraLax, etc) should be taken according to package directions if there are no bowel movements after 48 hours.   ? ?Eliquis: If no further bleeding (bright red or maroon stool) you may restart this on 4/21. Monitor your stool for any evidence of bleeding similar to what you noticed over the weekend. If this occurs, please reach back out to [place]. ? ?ACTIVITIES as tolerated:   ?Avoid heavy lifting (>10lbs or 1 gallon of milk) for the next 6 weeks. ?You may resume regular daily activities as tolerated--such as daily self-care, walking, climbing stairs--gradually increasing activities as tolerated.  If you can walk 30 minutes without difficulty, it is safe to try more intense activity such as jogging, treadmill, bicycling, low-impact aerobics.  ?DO NOT PUSH THROUGH PAIN.  Let pain be your guide: If it hurts to do something, don't do it. ?You may drive when you are no longer taking prescription pain medication, you can comfortably wear a seatbelt, and you can safely maneuver your car and apply brakes. ? ?FOLLOW UP in our office ?Please call [facility] at [phone] to set up  an appointment to see your surgeon in the office for a follow-up appointment approximately 2 weeks after your surgery. ?Make sure that you call for this appointment the day you arrive home to insure a convenient appointment time. ? ?9. If you have disability or family leave forms that need to be completed, you may have them completed by your primary care physician's office; for return to work  instructions, please ask our office staff and they will be happy to assist you in obtaining this documentation ?  ?When to call us [phone]: ?Poor pain control ?Reactions / problems with new medications (rash/itching, etc)  ?Fever over 101.5 F (38.5 C) ?Inability to urinate ?Nausea/vomiting ?Worsening swelling or bruising ?Continued bleeding from incision. ?Increased pain, redness, or drainage from the incision ? ?The clinic staff is available to answer your questions during regular business hours (8:30am-5pm).  Please don?t hesitate to call and ask to speak to one of our nurses for clinical concerns.   A surgeon from [facility] is always on call at the hospitals ?  ?If you have a medical emergency, go to the nearest emergency room or call 911. ? ?[facility] [place] ?[address] [place], [place], [place]  [address] ?MAIN: [phone] ?FAX: [phone] ?[email or web address] ?

## 2021-07-30 NOTE — Progress Notes (Signed)
Discharge instructions given to patient and verbalizes understanding. Patient with no complaint at the current time. Will d/c via WC. Family at bedside. ?

## 2021-07-30 NOTE — Progress Notes (Signed)
?  Subjective ?Uneventful night - no significant bleeding in stool now. Solidifying and some old blood but nothing bright red or maroon. Hgb stable ? ? ?Objective: ?Vital signs in last 24 hours: ?Temp:  [98.4 ?F (36.9 ?C)-99 ?F (37.2 ?C)] 98.4 ?F (36.9 ?C) (04/18 0517) ?Pulse Rate:  [76-85] 76 (04/18 0517) ?Resp:  [16-17] 17 (04/18 0517) ?BP: (120-142)/(58-69) 142/69 (04/18 0517) ?SpO2:  [97 %-99 %] 98 % (04/18 0517) ?Last BM Date : 07/29/21 ? ?Intake/Output from previous day: ?04/17 0701 - 04/18 0700 ?In: 3331.9 [P.O.:930; I.V.:2401.9] ?Out: -  ?Intake/Output this shift: ?No intake/output data recorded. ? ?Gen: NAD, comfortable ?CV: RRR ?Pulm: Normal work of breathing ?Abd: Soft, NT/ND. Some ecchymosis around extraction site. No palpable hernias ?Ext: SCDs in place ? ?Lab Results: ?CBC  ?Recent Labs  ?  07/29/21 ?0708 07/30/21 ?0700  ?WBC 7.2 8.2  ?HGB 10.3* 10.6*  ?HCT 31.2* 31.6*  ?PLT 249 261  ? ?BMET ?Recent Labs  ?  07/28/21 ?1902 07/29/21 ?0708  ?NA 138 138  ?K 4.3 3.8  ?CL 109 110  ?CO2 19* 18*  ?GLUCOSE 93 75  ?BUN 25* 21  ?CREATININE 1.12* 0.98  ?CALCIUM 9.0 8.4*  ? ?PT/INR ?No results for input(s): LABPROT, INR in the last 72 hours. ?ABG ?No results for input(s): PHART, HCO3 in the last 72 hours. ? ?Invalid input(s): PCO2, PO2 ? ?Studies/Results: ? ?Anti-infectives: ?Anti-infectives (From admission, onward)  ? ? None  ? ?  ? ? ? ?Assessment/Plan: ?Patient Active Problem List  ? Diagnosis Date Noted  ? S/P right hemicolectomy 07/28/2021  ? Colon cancer ([place]) 07/24/2021  ? PAF (paroxysmal atrial fibrillation) ([place]) 08/17/2020  ? Rapid heart rate 09/23/2019  ? Essential hypertension 09/23/2019  ? Unspecified constipation 09/22/2012  ? Syncope 09/02/2012  ? Elevated troponin 09/02/2012  ? Thrombocytopenia ([place]) 09/02/2012  ? Neutropenia, febrile ([place]) 09/02/2012  ? Hypothyroidism 09/02/2012  ? Dyslipidemia 09/02/2012  ? Colitis 09/02/2012  ? Breast cancer of lower-inner quadrant of right female breast ([place])  07/01/2012  ? ?80yoF hx afib (on Eliquis), HTN, hypothyroidism - underwent laparoscopic right hemicolectomy 07/24/21 - now with presumed anastomotic bleed following resumption of Eliquis ? ?- Heart healthy diet ?- Hgb stable. No further bleeding ?- She and family are comfortable with and stable for discharge home. We reviewed things to watch out for. Follow-up in my office arranged ?- Ppx: SCDs; hold NSAIDs and all chemical dvt prophylaxis at present given bleeding. Hold Eliquis ? ? LOS: 2 days  ? ?Nadeen Landau, MD FACS ?[facility] ? ?

## 2021-07-31 LAB — SURGICAL PATHOLOGY

## 2021-08-01 NOTE — Discharge Summary (Signed)
Patient ID: ?Shelia Obrien ?MRN: [id number] ?DOB/AGE: Nov 26, 1940 81 y.o. ? ?Admit date: 07/28/2021 ?Discharge date: 07/30/2021 ? ?Discharge Diagnoses ?Patient Active Problem List  ? Diagnosis Date Noted  ? S/P right hemicolectomy 07/28/2021  ? Colon cancer ([place]) 07/24/2021  ? PAF (paroxysmal atrial fibrillation) ([place]) 08/17/2020  ? Rapid heart rate 09/23/2019  ? Essential hypertension 09/23/2019  ? Unspecified constipation 09/22/2012  ? Syncope 09/02/2012  ? Elevated troponin 09/02/2012  ? Thrombocytopenia ([place]) 09/02/2012  ? Neutropenia, febrile ([place]) 09/02/2012  ? Hypothyroidism 09/02/2012  ? Dyslipidemia 09/02/2012  ? Colitis 09/02/2012  ? Breast cancer of lower-inner quadrant of right female breast ([place]) 07/01/2012  ? ? ?Consultants ?None ? ?Procedures ?None ? ?Hospital Course: She was admitted for close monitoring given BRBPR. Her anticoagulation was held. Her bleeding resolved and hemoglobin stabilized. She was comfortable with and stable for discharge home 07/30/21 ? ? ? ?Allergies as of 07/30/2021   ?No Known Allergies ?  ? ?  ?Medication List  ?  ? ?TAKE these medications   ? ?acetaminophen 500 MG tablet ?Commonly known as: TYLENOL ?Take 1,000 mg by mouth every 8 (eight) hours as needed for moderate pain. ?  ?acidophilus Caps capsule ?Take 1 capsule by mouth daily. ?  ?apixaban 5 MG Tabs tablet ?Commonly known as: Eliquis ?Take 1 tablet (5 mg total) by mouth 2 (two) times daily. ?  ?b complex vitamins tablet ?Take 1 tablet by mouth daily. ?  ?calcium carbonate 1500 (600 Ca) MG Tabs tablet ?Commonly known as: OSCAL ?Take 600 mg by mouth in the morning and at bedtime. ?  ?cetirizine 10 MG tablet ?Commonly known as: ZYRTEC ?Take 10 mg by mouth daily. ?  ?denosumab 60 MG/ML Sosy injection ?Commonly known as: PROLIA ?Inject 60 mg into the skin every 6 (six) months. ?  ?famotidine 20 MG tablet ?Commonly known as: PEPCID ?Take 1 tablet (20 mg total) by mouth 2 (two) times daily. ?  ?fluticasone 50 MCG/ACT nasal  spray ?Commonly known as: FLONASE ?Place 1 spray into both nostrils daily as needed for allergies. ?  ?Hair/Skin/Nails Tabs ?Take 1 tablet by mouth daily. ?  ?levothyroxine 75 MCG tablet ?Commonly known as: SYNTHROID ?Take 37.5-75 mcg by mouth See admin instructions. Take 75 mcg by mouth daily except on Friday take 37.5 mcg ?  ?losartan 25 MG tablet ?Commonly known as: COZAAR ?Take 25 mg by mouth daily. ?  ?metoprolol succinate 25 MG 24 hr tablet ?Commonly known as: Toprol XL ?Take 1 tablet (25 mg total) by mouth daily. ?  ?rosuvastatin 10 MG tablet ?Commonly known as: CRESTOR ?Take 10 mg by mouth daily. ?  ?TURMERIC PO ?Take 1,000 mg by mouth daily. ?  ?valACYclovir 1000 MG tablet ?Commonly known as: VALTREX ?Take 1,000 mg by mouth 2 (two) times daily as needed (for cold sores). ?  ?vitamin C 1000 MG tablet ?Take 1,000 mg by mouth daily. ?  ?Vitamin D 50 MCG (2000 UT) tablet ?Take 1 tablet (2,000 Units total) by mouth 3 (three) times a week. ?What changed: when to take this ?  ? ?  ? ?ASK your doctor about these medications   ? ?traMADol 50 MG tablet ?Commonly known as: Ultram ?Take 1 tablet (50 mg total) by mouth every 6 (six) hours as needed for up to 5 days (postop pain not controlled with tylenol and ibuprofen first). ?Ask about: Should I take this medication? ?  ? ?  ? ? ? ? Follow-up Information   ? ? Ileana Roup,  MD. Schedule an appointment as soon as possible for a visit in 2 week(s).   ?Specialties: General Surgery, Colon and Rectal Surgery ?Contact information: ?[address] ?[address] ?[place] [address] ?[phone] ? ? ?  ?  ? ?  ?  ? ?  ? ? ?Sharon Mt. Dema Severin, M.D. ?[facility] ?

## 2021-08-14 ENCOUNTER — Other Ambulatory Visit: Payer: Self-pay

## 2021-08-14 NOTE — Progress Notes (Signed)
The proposed treatment discussed in conference is for discussion purpose only and is not a binding recommendation.  The patients have not been physically examined, or presented with their treatment options.  Therefore, final treatment plans cannot be decided.  

## 2021-08-22 DIAGNOSIS — R69 Illness, unspecified: Secondary | ICD-10-CM | POA: Diagnosis not present

## 2021-08-22 DIAGNOSIS — J309 Allergic rhinitis, unspecified: Secondary | ICD-10-CM | POA: Diagnosis not present

## 2021-08-22 DIAGNOSIS — E785 Hyperlipidemia, unspecified: Secondary | ICD-10-CM | POA: Diagnosis not present

## 2021-08-22 DIAGNOSIS — R32 Unspecified urinary incontinence: Secondary | ICD-10-CM | POA: Diagnosis not present

## 2021-08-22 DIAGNOSIS — E039 Hypothyroidism, unspecified: Secondary | ICD-10-CM | POA: Diagnosis not present

## 2021-08-22 DIAGNOSIS — K219 Gastro-esophageal reflux disease without esophagitis: Secondary | ICD-10-CM | POA: Diagnosis not present

## 2021-08-22 DIAGNOSIS — Z7901 Long term (current) use of anticoagulants: Secondary | ICD-10-CM | POA: Diagnosis not present

## 2021-08-22 DIAGNOSIS — I4891 Unspecified atrial fibrillation: Secondary | ICD-10-CM | POA: Diagnosis not present

## 2021-08-22 DIAGNOSIS — D6869 Other thrombophilia: Secondary | ICD-10-CM | POA: Diagnosis not present

## 2021-08-22 DIAGNOSIS — Z833 Family history of diabetes mellitus: Secondary | ICD-10-CM | POA: Diagnosis not present

## 2021-08-30 ENCOUNTER — Encounter: Payer: Self-pay | Admitting: Cardiovascular Disease

## 2021-08-30 ENCOUNTER — Ambulatory Visit: Payer: Medicare HMO | Admitting: Cardiovascular Disease

## 2021-08-30 DIAGNOSIS — I10 Essential (primary) hypertension: Secondary | ICD-10-CM

## 2021-08-30 DIAGNOSIS — E785 Hyperlipidemia, unspecified: Secondary | ICD-10-CM | POA: Diagnosis not present

## 2021-08-30 DIAGNOSIS — I480 Paroxysmal atrial fibrillation: Secondary | ICD-10-CM

## 2021-08-30 NOTE — Assessment & Plan Note (Signed)
History of dyslipidemia on statin therapy with lipid profile performed 03/22/2021 revealing total cholesterol 139, LDL 74 and HDL of 39.

## 2021-08-30 NOTE — Patient Instructions (Signed)

## 2021-08-30 NOTE — Assessment & Plan Note (Signed)
History of essential hypertension a blood pressure measured today at 130/82.  She is on losartan and metoprolol.

## 2021-08-30 NOTE — Progress Notes (Signed)
08/30/2021 Shelia Obrien   1940-12-07  [id number]  Primary Physician Crist Infante, MD Primary Cardiologist: Lorretta Harp MD Lupe Carney, [place]  HPI:  Shelia Obrien is a 81 y.o.  mildly overweight widowed Caucasian female mother of 2 children, grandmother of 2 grandchildren referred by Dr. Joylene Draft for evaluation of tachycardia.   She is accompanied by her daughter Shelia Obrien today.  She is retired from working at [employer] for 20 years, [employer] and [employer] for 26 years.  I last saw her in the office 04/19/2021. She has a history of treated hypertension hyperlipidemia.  She is a family history of heart disease with a brother who had bypass surgery and a sister who has had stents.  She is never had a heart attack or stroke.  She denies chest pain or shortness of breath.  She has had breast cancer with right breast lumpectomy, chemotherapy and radiation therapy.  She had an episode of sustained elevated heart rate in the 160-170 range on 09/09/2019 for unclear reasons.  She did feel poorly that day.  She has had episodes of syncope in the past for unclear reasons and has mentioned episodes of dizziness as well.  I did perform 2D echocardiography which was essentially normal and an event monitor which showed episodes of PAF after which I began her on Eliquis oral anticoagulation and Toprol-XL 25 mg a day.   Since I saw her 5 months ago she continues to do well.  She has had no clinical episodes of A. fib.  She denies chest pain or shortness of breath.  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   Current Meds  Medication Sig   acetaminophen (TYLENOL) 500 MG tablet Take 1,000 mg by mouth every 8 (eight) hours as needed for moderate pain.   acidophilus (RISAQUAD) CAPS capsule Take 1 capsule by mouth daily.   apixaban (ELIQUIS) 5 MG TABS tablet Take 1 tablet (5 mg total) by mouth 2 (two) times daily.   Ascorbic Acid  (VITAMIN C) 1000 MG tablet Take 1,000 mg by mouth daily.   b complex vitamins tablet Take 1 tablet by mouth daily.   calcium carbonate (OSCAL) 1500 (600 Ca) MG TABS tablet Take 600 mg by mouth in the morning and at bedtime.   cetirizine (ZYRTEC) 10 MG tablet Take 10 mg by mouth daily.   Cholecalciferol (VITAMIN D) 50 MCG (2000 UT) tablet Take 1 tablet (2,000 Units total) by mouth 3 (three) times a week. (Patient taking differently: Take 2,000 Units by mouth every Monday, Wednesday, and Friday.)   denosumab (PROLIA) 60 MG/ML SOSY injection Inject 60 mg into the skin every 6 (six) months.   famotidine (PEPCID) 20 MG tablet Take 1 tablet (20 mg total) by mouth 2 (two) times daily.   fluticasone (FLONASE) 50 MCG/ACT nasal spray Place 1 spray into both nostrils daily as needed for allergies.   levothyroxine (SYNTHROID, LEVOTHROID) 75 MCG tablet Take 37.5-75 mcg by mouth See admin instructions. Take 75 mcg by mouth daily except on Friday take 37.5 mcg   losartan (COZAAR) 25 MG tablet Take 25 mg by mouth daily.   metoprolol succinate (TOPROL XL) 25 MG 24 hr tablet Take 1 tablet (25 mg total) by mouth daily.   Multiple Vitamins-Minerals (HAIR/SKIN/NAILS) TABS Take 1 tablet by mouth daily.   rosuvastatin (CRESTOR) 10 MG tablet Take 10 mg by mouth daily.    TURMERIC  PO Take 1,000 mg by mouth daily.   valACYclovir (VALTREX) 1000 MG tablet Take 1,000 mg by mouth 2 (two) times daily as needed (for cold sores).     No Known Allergies  Social History   Socioeconomic History   Marital status: Widowed    Spouse name: Not on file   Number of children: Not on file   Years of education: Not on file   Highest education level: Not on file  Occupational History   Not on file  Tobacco Use   Smoking status: Never   Smokeless tobacco: Never  Vaping Use   Vaping Use: Never used  Substance and Sexual Activity   Alcohol use: No   Drug use: No   Sexual activity: Not Currently    Comment: menarche 22, P 2,  HRT 14 + yrs  Other Topics Concern   Not on file  Social History Narrative   Not on file   Social Determinants of Health   Financial Resource Strain: Not on file  Food Insecurity: Not on file  Transportation Needs: Not on file  Physical Activity: Not on file  Stress: Not on file  Social Connections: Not on file  Intimate Partner Violence: Not on file     Review of Systems: General: negative for chills, fever, night sweats or weight changes.  Cardiovascular: negative for chest pain, dyspnea on exertion, edema, orthopnea, palpitations, paroxysmal nocturnal dyspnea or shortness of breath Dermatological: negative for rash Respiratory: negative for cough or wheezing Urologic: negative for hematuria Abdominal: negative for nausea, vomiting, diarrhea, bright red blood per rectum, melena, or hematemesis Neurologic: negative for visual changes, syncope, or dizziness All other systems reviewed and are otherwise negative except as noted above.    Blood pressure 130/82, pulse 70, height '5\' 3"'$  (1.6 m), weight 148 lb 3.2 oz (67.2 kg), SpO2 96 %.  General appearance: alert and no distress Neck: no adenopathy, no carotid bruit, no JVD, supple, symmetrical, trachea midline, and thyroid not enlarged, symmetric, no tenderness/mass/nodules Lungs: clear to auscultation bilaterally Heart: regular rate and rhythm, S1, S2 normal, no murmur, click, rub or gallop Extremities: extremities normal, atraumatic, no cyanosis or edema Pulses: 2+ and symmetric Skin: Skin color, texture, turgor normal. No rashes or lesions Neurologic: Grossly normal  EKG sinus rhythm at 70 without ST or T wave changes.  I personally reviewed this EKG.  ASSESSMENT AND PLAN:   Dyslipidemia History of dyslipidemia on statin therapy with lipid profile performed 03/22/2021 revealing total cholesterol 139, LDL 74 and HDL of 39.  Essential hypertension History of essential hypertension a blood pressure measured today at 130/82.   She is on losartan and metoprolol.  PAF (paroxysmal atrial fibrillation) (HCC) History of PAF maintaining sinus rhythm on Eliquis oral anticoagulation.     Lorretta Harp MD FACP,FACC,FAHA, [facility] 08/30/2021 11:19 AM

## 2021-08-30 NOTE — Assessment & Plan Note (Signed)
History of PAF maintaining sinus rhythm on Eliquis oral anticoagulation. 

## 2021-09-20 DIAGNOSIS — Z23 Encounter for immunization: Secondary | ICD-10-CM | POA: Diagnosis not present

## 2021-09-20 DIAGNOSIS — K5730 Diverticulosis of large intestine without perforation or abscess without bleeding: Secondary | ICD-10-CM | POA: Diagnosis not present

## 2021-09-20 DIAGNOSIS — R7301 Impaired fasting glucose: Secondary | ICD-10-CM | POA: Diagnosis not present

## 2021-09-20 DIAGNOSIS — I10 Essential (primary) hypertension: Secondary | ICD-10-CM | POA: Diagnosis not present

## 2021-09-20 DIAGNOSIS — I872 Venous insufficiency (chronic) (peripheral): Secondary | ICD-10-CM | POA: Diagnosis not present

## 2021-09-20 DIAGNOSIS — M1990 Unspecified osteoarthritis, unspecified site: Secondary | ICD-10-CM | POA: Diagnosis not present

## 2021-09-20 DIAGNOSIS — I480 Paroxysmal atrial fibrillation: Secondary | ICD-10-CM | POA: Diagnosis not present

## 2021-09-20 DIAGNOSIS — M8580 Other specified disorders of bone density and structure, unspecified site: Secondary | ICD-10-CM | POA: Diagnosis not present

## 2021-09-20 DIAGNOSIS — N39498 Other specified urinary incontinence: Secondary | ICD-10-CM | POA: Diagnosis not present

## 2021-09-20 DIAGNOSIS — R413 Other amnesia: Secondary | ICD-10-CM | POA: Diagnosis not present

## 2021-09-20 DIAGNOSIS — E039 Hypothyroidism, unspecified: Secondary | ICD-10-CM | POA: Diagnosis not present

## 2021-10-24 ENCOUNTER — Other Ambulatory Visit (HOSPITAL_COMMUNITY): Payer: Self-pay | Admitting: *Deleted

## 2021-10-28 ENCOUNTER — Ambulatory Visit (HOSPITAL_COMMUNITY)
Admission: RE | Admit: 2021-10-28 | Discharge: 2021-10-28 | Disposition: A | Discharge status: Home / Self Care | Payer: Medicare HMO | Source: Ambulatory Visit | Attending: Internal Medicine | Admitting: Internal Medicine

## 2021-10-28 DIAGNOSIS — M810 Age-related osteoporosis without current pathological fracture: Secondary | ICD-10-CM | POA: Diagnosis not present

## 2021-10-28 MED ORDER — DENOSUMAB 60 MG/ML ~~LOC~~ SOSY
60.0000 mg | PREFILLED_SYRINGE | Freq: Once | SUBCUTANEOUS | Status: AC
Start: 2021-10-28 — End: 2021-10-28
  Administered 2021-10-28: 60 mg via SUBCUTANEOUS

## 2021-10-28 MED ORDER — DENOSUMAB 60 MG/ML ~~LOC~~ SOSY
PREFILLED_SYRINGE | SUBCUTANEOUS | Status: AC
  Filled 2021-10-28: qty 1

## 2021-10-30 DIAGNOSIS — Z961 Presence of intraocular lens: Secondary | ICD-10-CM | POA: Diagnosis not present

## 2021-10-30 DIAGNOSIS — H524 Presbyopia: Secondary | ICD-10-CM | POA: Diagnosis not present

## 2021-10-30 DIAGNOSIS — H04123 Dry eye syndrome of bilateral lacrimal glands: Secondary | ICD-10-CM | POA: Diagnosis not present

## 2021-10-30 DIAGNOSIS — H52203 Unspecified astigmatism, bilateral: Secondary | ICD-10-CM | POA: Diagnosis not present

## 2021-11-11 DIAGNOSIS — M1990 Unspecified osteoarthritis, unspecified site: Secondary | ICD-10-CM | POA: Diagnosis not present

## 2021-11-11 DIAGNOSIS — I10 Essential (primary) hypertension: Secondary | ICD-10-CM | POA: Diagnosis not present

## 2021-11-11 DIAGNOSIS — E785 Hyperlipidemia, unspecified: Secondary | ICD-10-CM | POA: Diagnosis not present

## 2021-11-11 DIAGNOSIS — E039 Hypothyroidism, unspecified: Secondary | ICD-10-CM | POA: Diagnosis not present

## 2021-12-12 DIAGNOSIS — Z0100 Encounter for examination of eyes and vision without abnormal findings: Secondary | ICD-10-CM | POA: Diagnosis not present

## 2022-01-15 DIAGNOSIS — Z1231 Encounter for screening mammogram for malignant neoplasm of breast: Secondary | ICD-10-CM | POA: Diagnosis not present

## 2022-01-18 DIAGNOSIS — Z23 Encounter for immunization: Secondary | ICD-10-CM | POA: Diagnosis not present

## 2022-01-28 DIAGNOSIS — Z85828 Personal history of other malignant neoplasm of skin: Secondary | ICD-10-CM | POA: Diagnosis not present

## 2022-01-28 DIAGNOSIS — D225 Melanocytic nevi of trunk: Secondary | ICD-10-CM | POA: Diagnosis not present

## 2022-01-28 DIAGNOSIS — L570 Actinic keratosis: Secondary | ICD-10-CM | POA: Diagnosis not present

## 2022-01-28 DIAGNOSIS — Z08 Encounter for follow-up examination after completed treatment for malignant neoplasm: Secondary | ICD-10-CM | POA: Diagnosis not present

## 2022-01-28 DIAGNOSIS — L814 Other melanin hyperpigmentation: Secondary | ICD-10-CM | POA: Diagnosis not present

## 2022-01-28 DIAGNOSIS — L821 Other seborrheic keratosis: Secondary | ICD-10-CM | POA: Diagnosis not present

## 2022-03-19 DIAGNOSIS — Z85038 Personal history of other malignant neoplasm of large intestine: Secondary | ICD-10-CM | POA: Diagnosis not present

## 2022-03-19 DIAGNOSIS — Z08 Encounter for follow-up examination after completed treatment for malignant neoplasm: Secondary | ICD-10-CM | POA: Diagnosis not present

## 2022-03-19 DIAGNOSIS — C189 Malignant neoplasm of colon, unspecified: Secondary | ICD-10-CM | POA: Diagnosis not present

## 2022-04-24 DIAGNOSIS — Z780 Asymptomatic menopausal state: Secondary | ICD-10-CM | POA: Diagnosis not present

## 2022-04-24 DIAGNOSIS — R7301 Impaired fasting glucose: Secondary | ICD-10-CM | POA: Diagnosis not present

## 2022-04-24 DIAGNOSIS — E039 Hypothyroidism, unspecified: Secondary | ICD-10-CM | POA: Diagnosis not present

## 2022-04-24 DIAGNOSIS — E785 Hyperlipidemia, unspecified: Secondary | ICD-10-CM | POA: Diagnosis not present

## 2022-04-24 DIAGNOSIS — E559 Vitamin D deficiency, unspecified: Secondary | ICD-10-CM | POA: Diagnosis not present

## 2022-04-24 DIAGNOSIS — I10 Essential (primary) hypertension: Secondary | ICD-10-CM | POA: Diagnosis not present

## 2022-05-02 DIAGNOSIS — Z0000 Encounter for general adult medical examination without abnormal findings: Secondary | ICD-10-CM | POA: Diagnosis not present

## 2022-05-02 DIAGNOSIS — C182 Malignant neoplasm of ascending colon: Secondary | ICD-10-CM | POA: Diagnosis not present

## 2022-05-02 DIAGNOSIS — Z1339 Encounter for screening examination for other mental health and behavioral disorders: Secondary | ICD-10-CM | POA: Diagnosis not present

## 2022-05-02 DIAGNOSIS — C44519 Basal cell carcinoma of skin of other part of trunk: Secondary | ICD-10-CM | POA: Diagnosis not present

## 2022-05-02 DIAGNOSIS — E785 Hyperlipidemia, unspecified: Secondary | ICD-10-CM | POA: Diagnosis not present

## 2022-05-02 DIAGNOSIS — M1990 Unspecified osteoarthritis, unspecified site: Secondary | ICD-10-CM | POA: Diagnosis not present

## 2022-05-02 DIAGNOSIS — G3184 Mild cognitive impairment, so stated: Secondary | ICD-10-CM | POA: Diagnosis not present

## 2022-05-02 DIAGNOSIS — N1831 Chronic kidney disease, stage 3a: Secondary | ICD-10-CM | POA: Diagnosis not present

## 2022-05-02 DIAGNOSIS — R82998 Other abnormal findings in urine: Secondary | ICD-10-CM | POA: Diagnosis not present

## 2022-05-02 DIAGNOSIS — I129 Hypertensive chronic kidney disease with stage 1 through stage 4 chronic kidney disease, or unspecified chronic kidney disease: Secondary | ICD-10-CM | POA: Diagnosis not present

## 2022-05-02 DIAGNOSIS — M5380 Other specified dorsopathies, site unspecified: Secondary | ICD-10-CM | POA: Diagnosis not present

## 2022-05-02 DIAGNOSIS — H539 Unspecified visual disturbance: Secondary | ICD-10-CM | POA: Diagnosis not present

## 2022-05-02 DIAGNOSIS — Z1331 Encounter for screening for depression: Secondary | ICD-10-CM | POA: Diagnosis not present

## 2022-05-02 DIAGNOSIS — M8580 Other specified disorders of bone density and structure, unspecified site: Secondary | ICD-10-CM | POA: Diagnosis not present

## 2022-05-02 DIAGNOSIS — I480 Paroxysmal atrial fibrillation: Secondary | ICD-10-CM | POA: Diagnosis not present

## 2022-05-02 DIAGNOSIS — I10 Essential (primary) hypertension: Secondary | ICD-10-CM | POA: Diagnosis not present

## 2022-05-06 ENCOUNTER — Other Ambulatory Visit: Payer: Self-pay

## 2022-05-06 ENCOUNTER — Other Ambulatory Visit: Payer: Self-pay | Admitting: Internal Medicine

## 2022-05-06 ENCOUNTER — Encounter: Payer: Self-pay | Admitting: Genetic Counselor

## 2022-05-06 ENCOUNTER — Other Ambulatory Visit: Payer: Self-pay | Admitting: Genetic Counselor

## 2022-05-06 ENCOUNTER — Inpatient Hospital Stay: Payer: Medicare HMO

## 2022-05-06 ENCOUNTER — Inpatient Hospital Stay: Payer: Medicare HMO | Attending: Genetic Counselor | Admitting: Genetic Counselor

## 2022-05-06 DIAGNOSIS — C50311 Malignant neoplasm of lower-inner quadrant of right female breast: Secondary | ICD-10-CM

## 2022-05-06 DIAGNOSIS — Z170 Estrogen receptor positive status [ER+]: Secondary | ICD-10-CM

## 2022-05-06 DIAGNOSIS — H539 Unspecified visual disturbance: Secondary | ICD-10-CM

## 2022-05-06 DIAGNOSIS — C189 Malignant neoplasm of colon, unspecified: Secondary | ICD-10-CM

## 2022-05-06 DIAGNOSIS — Z8051 Family history of malignant neoplasm of kidney: Secondary | ICD-10-CM | POA: Diagnosis not present

## 2022-05-06 DIAGNOSIS — Z8042 Family history of malignant neoplasm of prostate: Secondary | ICD-10-CM

## 2022-05-06 DIAGNOSIS — Z803 Family history of malignant neoplasm of breast: Secondary | ICD-10-CM | POA: Insufficient documentation

## 2022-05-06 LAB — GENETIC SCREENING ORDER

## 2022-05-06 NOTE — Progress Notes (Signed)
REFERRING PROVIDER: Crist Infante, MD [place],  [place] [address]  PRIMARY PROVIDER:  Crist Infante, MD  PRIMARY REASON FOR VISIT:  1. Family history of breast cancer   2. Family history of prostate cancer   3. Family history of kidney cancer   4. Malignant neoplasm of colon, unspecified part of colon ([place])   5. Malignant neoplasm of lower-inner quadrant of right breast of female, estrogen receptor positive ([place])      HISTORY OF PRESENT ILLNESS:   Shelia Obrien, a 82 y.o. female, was seen for a [facility] cancer genetics consultation at the request of Dr. Joylene Draft due to a personal and family history of cancer.  Shelia Obrien presents to clinic today to discuss the possibility of a hereditary predisposition to cancer, genetic testing, and to further clarify her future cancer risks, as well as potential cancer risks for family members.   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  The tumor was ER+/PR+/Her2-.  In 2023, at the age of 28, Shelia Obrien was diagnosed with colon cancer.  The tumor showed MSI-H, and loss of MLH1/PMS2.  Hypermethylation was present indicating that the cancer was sporadic rather than due to Lynch syndrome.     CANCER HISTORY:  Oncology History  Breast cancer of lower-inner quadrant of right female breast ([place])  07/01/2012 Initial Diagnosis   Breast cancer of lower-inner quadrant of right female breast, BCT 07/2012, high oncotype, poorly tolerated chemo   07/23/2012 Surgery   Lumpectomy with SLN biopsy, 1.7 cm Inv lobular ca with LCIS, Perineural inv, Er:93%; PR 7%; Her2 Neg (ratio 1.04), Ki 67: 12%;T1cN0M0 (stage IA)   08/27/2012 - 09/17/2012 Chemotherapy   2 cycles of Taxotere, Cytoxan Could not tolerate it. Stopped chemo.   10/26/2012 - 11/22/2012 Radiation Therapy   Rsdiation to lumpectomy site   12/14/2012 -  Anti-estrogen oral therapy   Tamoxifen 20 mg daily until August 2015 switched to Arimidex 1 mg daily (given  along with Effexor), planned treatment duration 7 years       RISK FACTORS:  Menarche was at age 64.  First live birth at age 41.  OCP use for approximately  10+  years.  Ovaries intact: yes.  Hysterectomy: no.  Menopausal status: postmenopausal.  HRT use: 0 years. Colonoscopy: yes; abnormal. Mammogram within the last year: yes. Number of breast biopsies: 1. Up to date with pelvic exams: yes. Any excessive radiation exposure in the past: no  Past Medical History:  Diagnosis Date   Acid reflux disease    Breast cancer ([place]) 06/24/2012   right, lower inner   Bronchitis    Epistaxis    went to [facility] ER   Family history of breast cancer    Family history of breast cancer    Family history of kidney cancer    Family history of prostate cancer    Family history of prostate cancer    High cholesterol    History of radiation therapy 10/26/2012-11/22/2012   49 gray to the right breast   Hypertension 09/12/2012   no current bp meds   Hypothyroidism    Leaking of urine    Dribbles if coughs. Pt wears pad   Pneumonia    as a child   PONV (postoperative nausea and vomiting)    Seasonal allergies    Shortness of breath    with exertion   Swelling of left lower extremity    x 2 weeks,  no sob   Swelling of right lower extremity x 2 weeks   no sob   Thyroid disease    Use of tamoxifen (Nolvadex)    Wears glasses     Past Surgical History:  Procedure Laterality Date   ABDOMINAL HYSTERECTOMY  04/15/1975   partial, heavy menses   bilateral cataract surgery      BREAST LUMPECTOMY WITH NEEDLE LOCALIZATION AND AXILLARY SENTINEL LYMPH NODE BX Right 07/22/2012   Procedure: BREAST LUMPECTOMY WITH NEEDLE LOCALIZATION AND AXILLARY SENTINEL LYMPH NODE BX;  Surgeon: Stark Klein, MD;  Location: [facility];  Service: General;  Laterality: Right;   CARDIOVASCULAR STRESS TEST  12/07/2012   [facility]   CHOLECYSTECTOMY  06/21/1996   COLONOSCOPY     KNEE ARTHROSCOPY Left  04/11/2002   LAPAROSCOPIC RIGHT HEMI COLECTOMY N/A 07/24/2021   Procedure: LAPAROSCOPIC RIGHT HEMI COLECTOMY;  Surgeon: Ileana Roup, MD;  Location: [facility];  Service: General;  Laterality: N/A;   NASAL SEPTUM SURGERY  04/01/1995   PORT-A-CATH REMOVAL N/A 11/02/2012   Procedure: REMOVAL PORT-A-CATH;  Surgeon: Stark Klein, MD;  Location: [facility];  Service: General;  Laterality: N/A;   PORTACATH PLACEMENT Left 08/25/2012   Procedure: INSERTION PORT-A-CATH;  Surgeon: Stark Klein, MD;  Location: [facility];  Service: General;  Laterality: Left;   UPPER GI ENDOSCOPY     last in 05/10/10    Social History   Socioeconomic History   Marital status: Widowed    Spouse name: Not on file   Number of children: Not on file   Years of education: Not on file   Highest education level: Not on file  Occupational History   Not on file  Tobacco Use   Smoking status: Never   Smokeless tobacco: Never  Vaping Use   Vaping Use: Never used  Substance and Sexual Activity   Alcohol use: No   Drug use: No   Sexual activity: Not Currently    Comment: menarche 28, P 2, HRT 14 + yrs  Other Topics Concern   Not on file  Social History Narrative   Not on file   Social Determinants of Health   Financial Resource Strain: Not on file  Food Insecurity: Not on file  Transportation Needs: Not on file  Physical Activity: Not on file  Stress: Not on file  Social Connections: Not on file     FAMILY HISTORY:  We obtained a detailed, 4-generation family history.  Significant diagnoses are listed below: Family History  Problem Relation Age of Onset   Cirrhosis Mother    Prostate cancer Father 29   Stomach cancer Father 70   Cancer Father 87       adrenal   Prostate cancer Brother 5   Diabetes Brother    Breast cancer Maternal Aunt    Lung cancer Maternal Uncle    Lung cancer Maternal Grandmother    Kidney cancer Maternal Grandmother        The patient has a son and  daughter who are cancer free.  She has four brothers and two sisters.  One brother had prostate cancer and melanoma.  Both parents are deceased.  The patient's father had prostate, stomach and adrenal gland cancer.  He had two brothers and two sisters who were cancer free. His parents died before the patient was born and there is no information.  The patient's mother is deceased.  She had two brothers and a sister who were full siblings to her and  thee paternal half siblings.  Her full sister had breast cancer.  The maternal grandmother had kidney cancer.  Shelia Obrien is unaware of previous family history of genetic testing for hereditary cancer risks. Patient's ancestors are of English/Scottish/Irish descent. There is no reported Ashkenazi Jewish ancestry. There is no known consanguinity.  GENETIC COUNSELING ASSESSMENT: Shelia Obrien is a 82 y.o. female with a personal and family history of cancer which is somewhat suggestive of a hereditary cancer syndrome and predisposition to cancer given the number of people in the family with breast and prostate cancer. We, therefore, discussed and recommended the following at today's visit.   DISCUSSION: We discussed that, in general, most cancer is not inherited in families, but instead is sporadic or familial. Sporadic cancers occur by chance and typically happen at older ages (>50 years) as this type of cancer is caused by genetic changes acquired during an individual's lifetime. Some families have more cancers than would be expected by chance; however, the ages or types of cancer are not consistent with a known genetic mutation or known genetic mutations have been ruled out. This type of familial cancer is thought to be due to a combination of multiple genetic, environmental, hormonal, and lifestyle factors. While this combination of factors likely increases the risk of cancer, the exact source of this risk is not currently identifiable or testable.  We  discussed that 5 - 10% of breast cancer is hereditary, with most cases associated with BRCA mutations.  There are other genes that can be associated with hereditary breast cancer syndromes.  These include ATM, CHEK2 and PALB2. While several of the cancers in the family can be related to her colon cancer, we discussed that the biomarker's in her tumor were suggestive of a sporadic tumor (due to the hypermethylation of MLH1). We discussed that testing is beneficial for several reasons including knowing how to follow individuals after completing their treatment, identifying whether potential treatment options such as PARP inhibitors would be beneficial, and understand if other family members could be at risk for cancer and allow them to undergo genetic testing.   We reviewed the characteristics, features and inheritance patterns of hereditary cancer syndromes. We also discussed genetic testing, including the appropriate family members to test, the process of testing, insurance coverage and turn-around-time for results. We discussed the implications of a negative, positive, carrier and/or variant of uncertain significant result. Shelia Obrien  was offered a common hereditary cancer panel (47 genes) and an expanded pan-cancer panel (77 genes). Shelia Obrien was informed of the benefits and limitations of each panel, including that expanded pan-cancer panels contain genes that do not have clear management guidelines at this point in time.  We also discussed that as the number of genes included on a panel increases, the chances of variants of uncertain significance increases. Shelia Obrien decided to pursue genetic testing for the CancerNext-Expanded+RNAinsight gene panel.   The CancerNext-Expanded gene panel offered by [facility] and includes sequencing and rearrangement analysis for the following 77 genes: AIP, ALK, APC*, ATM*, AXIN2, BAP1, BARD1, BLM, BMPR1A, BRCA1*, BRCA2*, BRIP1*, CDC73, CDH1*, CDK4, CDKN1B, CDKN2A,  CHEK2*, CTNNA1, DICER1, FANCC, FH, FLCN, GALNT12, KIF1B, LZTR1, MAX, MEN1, MET, MLH1*, MSH2*, MSH3, MSH6*, MUTYH*, NBN, NF1*, NF2, NTHL1, PALB2*, PHOX2B, PMS2*, POT1, PRKAR1A, PTCH1, PTEN*, RAD51C*, RAD51D*, RB1, RECQL, RET, SDHA, SDHAF2, SDHB, SDHC, SDHD, SMAD4, SMARCA4, SMARCB1, SMARCE1, STK11, SUFU, TMEM127, TP53*, TSC1, TSC2, VHL and XRCC2 (sequencing and deletion/duplication); EGFR, EGLN1, HOXB13, KIT, MITF, PDGFRA, POLD1, and POLE (sequencing only);  EPCAM and GREM1 (deletion/duplication only). DNA and RNA analyses performed for * genes.   Based on Shelia Obrien's personal and family history of cancer, she meets medical criteria for genetic testing. Despite that she meets criteria, she may still have an out of pocket cost. We discussed that if her out of pocket cost for testing is over $100, the laboratory will call and confirm whether she wants to proceed with testing.  If the out of pocket cost of testing is less than $100 she will be billed by the genetic testing laboratory.   We discussed that some people do not want to undergo genetic testing due to fear of genetic discrimination.  The Genetic Information Nondiscrimination Act (GINA) was signed into federal law in 2008. GINA prohibits health insurers and most employers from discriminating against individuals based on genetic information (including the results of genetic tests and family history information). According to GINA, health insurance companies cannot consider genetic information to be a preexisting condition, nor can they use it to make decisions regarding coverage or rates. GINA also makes it illegal for most employers to use genetic information in making decisions about hiring, firing, promotion, or terms of employment. It is important to note that GINA does not offer protections for life insurance, disability insurance, or long-term care insurance. GINA does not apply to those in the [employer], those who work for companies with less than 15  employees, and new life insurance or long-term disability insurance policies.  Health status due to a cancer diagnosis is not protected under GINA. More information about GINA can be found by visiting [email or web address].   PLAN: After considering the risks, benefits, and limitations, Shelia Obrien provided informed consent to pursue genetic testing and the blood sample was sent to [facility] for analysis of the CancerNext-Expanded+RNAinsight. Results should be available within approximately 2-3 weeks' time, at which point they will be disclosed by telephone to Shelia Obrien, as will any additional recommendations warranted by these results. Shelia Obrien will receive a summary of her genetic counseling visit and a copy of her results once available. This information will also be available in Epic.   Lastly, we encouraged Shelia Obrien to remain in contact with cancer genetics annually so that we can continuously update the family history and inform her of any changes in cancer genetics and testing that may be of benefit for this family.   Shelia Obrien questions were answered to her satisfaction today. Our contact information was provided should additional questions or concerns arise. Thank you for the referral and allowing [place] to share in the care of your patient.   Shelia Obrien P. Florene Glen, [place], [facility] Licensed, Insurance risk surveyor [email or web address] phone: [phone]  The patient was seen for a total of 40 minutes in face-to-face genetic counseling.  The patient brought her daughter. Drs. Michell Heinrich, and/or [place] were available for questions, if needed..    _______________________________________________________________________ For Office Staff:  Number of people involved in session: 2 Was an Intern/ student involved with case: no

## 2022-05-21 ENCOUNTER — Other Ambulatory Visit (HOSPITAL_COMMUNITY): Payer: Self-pay | Admitting: *Deleted

## 2022-05-22 ENCOUNTER — Ambulatory Visit (HOSPITAL_COMMUNITY)
Admission: RE | Admit: 2022-05-22 | Discharge: 2022-05-22 | Disposition: A | Discharge status: Home / Self Care | Payer: Medicare HMO | Source: Ambulatory Visit | Attending: Internal Medicine | Admitting: Internal Medicine

## 2022-05-22 DIAGNOSIS — M810 Age-related osteoporosis without current pathological fracture: Secondary | ICD-10-CM | POA: Diagnosis not present

## 2022-05-22 MED ORDER — DENOSUMAB 60 MG/ML ~~LOC~~ SOSY: 60.0000 mg | PREFILLED_SYRINGE | Freq: Once | SUBCUTANEOUS | Status: AC

## 2022-05-22 MED ORDER — DENOSUMAB 60 MG/ML ~~LOC~~ SOSY
PREFILLED_SYRINGE | SUBCUTANEOUS | Status: AC
Start: 2022-05-22 — End: 2022-05-22
  Administered 2022-05-22: 60 mg via SUBCUTANEOUS
  Filled 2022-05-22: qty 1

## 2022-05-26 ENCOUNTER — Encounter: Payer: Self-pay | Admitting: Genetic Counselor

## 2022-05-26 ENCOUNTER — Ambulatory Visit: Payer: Self-pay | Admitting: Genetic Counselor

## 2022-05-26 ENCOUNTER — Telehealth: Payer: Self-pay | Admitting: Genetic Counselor

## 2022-05-26 DIAGNOSIS — Z1379 Encounter for other screening for genetic and chromosomal anomalies: Secondary | ICD-10-CM | POA: Insufficient documentation

## 2022-05-26 NOTE — Progress Notes (Signed)
HPI:  Shelia Obrien was previously seen in the [place] clinic due to a personal and family history of cancer and concerns regarding a hereditary predisposition to cancer. Please refer to our prior cancer genetics clinic note for more information regarding our discussion, assessment and recommendations, at the time. Shelia Obrien recent genetic test results were disclosed to her, as were recommendations warranted by these results. These results and recommendations are discussed in more detail below.  CANCER HISTORY:  Oncology History  Breast cancer of lower-inner quadrant of right female breast ([place])  07/01/2012 Initial Diagnosis   Breast cancer of lower-inner quadrant of right female breast, BCT 07/2012, high oncotype, poorly tolerated chemo   07/23/2012 Surgery   Lumpectomy with SLN biopsy, 1.7 cm Inv lobular ca with LCIS, Perineural inv, Er:93%; PR 7%; Her2 Neg (ratio 1.04), Ki 67: 12%;T1cN0M0 (stage IA)   08/27/2012 - 09/17/2012 Chemotherapy   2 cycles of Taxotere, Cytoxan Could not tolerate it. Stopped chemo.   10/26/2012 - 11/22/2012 Radiation Therapy   Rsdiation to lumpectomy site   12/14/2012 -  Anti-estrogen oral therapy   Tamoxifen 20 mg daily until August 2015 switched to Arimidex 1 mg daily (given along with Effexor), planned treatment duration 7 years    05/22/2022 Genetic Testing   Negative genetic testing on the CancerNext-Expanded+RNAinsight panel.  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  The report date is May 22, 2022.  The CancerNext-Expanded gene panel offered by [facility] and includes sequencing and rearrangement analysis for the following 77 genes: AIP, ALK, APC*, ATM*, AXIN2, BAP1, BARD1, BLM, BMPR1A, BRCA1*, BRCA2*, BRIP1*, CDC73, CDH1*, CDK4, CDKN1B, CDKN2A, CHEK2*, CTNNA1, DICER1, FANCC, FH, FLCN, GALNT12, KIF1B, LZTR1, MAX, MEN1, MET, MLH1*, MSH2*, MSH3, MSH6*, MUTYH*, NBN, NF1*, NF2, NTHL1, PALB2*, PHOX2B, PMS2*, POT1, PRKAR1A, PTCH1, PTEN*, RAD51C*, RAD51D*,  RB1, RECQL, RET, SDHA, SDHAF2, SDHB, SDHC, SDHD, SMAD4, SMARCA4, SMARCB1, SMARCE1, STK11, SUFU, TMEM127, TP53*, TSC1, TSC2, VHL and XRCC2 (sequencing and deletion/duplication); EGFR, EGLN1, HOXB13, KIT, MITF, PDGFRA, POLD1, and POLE (sequencing only); EPCAM and GREM1 (deletion/duplication only). DNA and RNA analyses performed for * genes.      FAMILY HISTORY:  We obtained a detailed, 4-generation family history.  Significant diagnoses are listed below: Family History  Problem Relation Age of Onset   Cirrhosis Mother    Prostate cancer Father 48   Stomach cancer Father 26   Cancer Father 72       adrenal   Prostate cancer Brother 63   Diabetes Brother    Breast cancer Maternal Aunt    Lung cancer Maternal Uncle    Lung cancer Maternal Grandmother    Kidney cancer Maternal Grandmother            The patient has a son and daughter who are cancer free.  She has four brothers and two sisters.  One brother had prostate cancer and melanoma.  Both parents are deceased.   The patient's father had prostate, stomach and adrenal gland cancer.  He had two brothers and two sisters who were cancer free. His parents died before the patient was born and there is no information.   The patient's mother is deceased.  She had two brothers and a sister who were full siblings to her and thee paternal half siblings.  Her full sister had breast cancer.  The maternal grandmother had kidney cancer.   Shelia Obrien is unaware of previous family history of genetic testing for hereditary cancer risks. Patient's ancestors are of English/Scottish/Irish descent. There is no reported  Ashkenazi Jewish ancestry. There is no known consanguinity.  GENETIC TEST RESULTS: Genetic testing reported out on May 22, 2022 through the CancerNext-Expanded+RNAinsight cancer panel found no pathogenic mutations. The CancerNext-Expanded gene panel offered by [facility] and includes sequencing and rearrangement analysis for the  following 77 genes: AIP, ALK, APC*, ATM*, AXIN2, BAP1, BARD1, BLM, BMPR1A, BRCA1*, BRCA2*, BRIP1*, CDC73, CDH1*, CDK4, CDKN1B, CDKN2A, CHEK2*, CTNNA1, DICER1, FANCC, FH, FLCN, GALNT12, KIF1B, LZTR1, MAX, MEN1, MET, MLH1*, MSH2*, MSH3, MSH6*, MUTYH*, NBN, NF1*, NF2, NTHL1, PALB2*, PHOX2B, PMS2*, POT1, PRKAR1A, PTCH1, PTEN*, RAD51C*, RAD51D*, RB1, RECQL, RET, SDHA, SDHAF2, SDHB, SDHC, SDHD, SMAD4, SMARCA4, SMARCB1, SMARCE1, STK11, SUFU, TMEM127, TP53*, TSC1, TSC2, VHL and XRCC2 (sequencing and deletion/duplication); EGFR, EGLN1, HOXB13, KIT, MITF, PDGFRA, POLD1, and POLE (sequencing only); EPCAM and GREM1 (deletion/duplication only). DNA and RNA analyses performed for * genes. The test report has been scanned into EPIC and is located under the Molecular Pathology section of the Results Review tab.  A portion of the result report is included below for reference.     We discussed with Shelia Obrien that because current genetic testing is not perfect, it is possible there may be a gene mutation in one of these genes that current testing cannot detect, but that chance is small.  We also discussed, that there could be another gene that has not yet been discovered, or that we have not yet tested, that is responsible for the cancer diagnoses in the family. It is also possible there is a hereditary cause for the cancer in the family that Shelia Obrien did not inherit and therefore was not identified in her testing.  Therefore, it is important to remain in touch with cancer genetics in the future so that we can continue to offer Shelia Obrien the most up to date genetic testing.   Genetic testing did identify a variant of uncertain significance (VUS) was identified in the MLH1 gene called [id number].  At this time, it is unknown if this variant is associated with increased cancer risk or if this is a normal finding, but most variants such as this get reclassified to being inconsequential. It should not be used to make medical  management decisions. With time, we suspect the lab will determine the significance of this variant, if any. If we do learn more about it, we will try to contact Shelia Obrien to discuss it further. However, it is important to stay in touch with [place] periodically and keep the address and phone number up to date.  ADDITIONAL GENETIC TESTING: We discussed with Shelia Obrien that her genetic testing was fairly extensive.  If there are genes identified to increase cancer risk that can be analyzed in the future, we would be happy to discuss and coordinate this testing at that time.    CANCER SCREENING RECOMMENDATIONS: Shelia Obrien test result is considered negative (normal).  This means that we have not identified a hereditary cause for her personal and family history of cancer at this time. Most cancers happen by chance and this negative test suggests that her cancer may fall into this category.    While reassuring, this does not definitively rule out a hereditary predisposition to cancer. It is still possible that there could be genetic mutations that are undetectable by current technology. There could be genetic mutations in genes that have not been tested or identified to increase cancer risk.  Therefore, it is recommended she continue to follow the cancer management and screening guidelines provided by her oncology and  primary healthcare provider.   An individual's cancer risk and medical management are not determined by genetic test results alone. Overall cancer risk assessment incorporates additional factors, including personal medical history, family history, and any available genetic information that may result in a personalized plan for cancer prevention and surveillance   RECOMMENDATIONS FOR FAMILY MEMBERS:  Individuals in this family might be at some increased risk of developing cancer, over the general population risk, simply due to the family history of cancer.  We recommended women in this family have  a yearly mammogram beginning at age 58, or 54 years younger than the earliest onset of cancer, an annual clinical breast exam, and perform monthly breast self-exams. Women in this family should also have a gynecological exam as recommended by their primary provider. All family members should be referred for colonoscopy starting at age 60.  FOLLOW-UP: Lastly, we discussed with Shelia Obrien that cancer genetics is a rapidly advancing field and it is possible that new genetic tests will be appropriate for her and/or her family members in the future. We encouraged her to remain in contact with cancer genetics on an annual basis so we can update her personal and family histories and let her know of advances in cancer genetics that may benefit this family.   Our contact number was provided. Shelia Obrien questions were answered to her satisfaction, and she knows she is welcome to call us at anytime with additional questions or concerns.   Roma Kayser, [place], [facility] Licensed, Certified Genetic Counselor [email or web address]

## 2022-05-26 NOTE — Telephone Encounter (Signed)
Revealed negative genetic testing.  Discussed that we do not know why she has breast and colon cancer or why there is cancer in the family. It could be due to a different gene that we are not testing, or maybe our current technology may not be able to pick something up.  It will be important for her to keep in contact with genetics to keep up with whether additional testing may be needed.   MLH1 VUS.  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

## 2022-06-06 ENCOUNTER — Ambulatory Visit
Admission: RE | Admit: 2022-06-06 | Discharge: 2022-06-06 | Disposition: A | Discharge status: Home / Self Care | Payer: Medicare HMO | Source: Ambulatory Visit | Attending: Internal Medicine | Admitting: Internal Medicine

## 2022-06-06 DIAGNOSIS — H539 Unspecified visual disturbance: Secondary | ICD-10-CM

## 2022-06-06 DIAGNOSIS — I6782 Cerebral ischemia: Secondary | ICD-10-CM | POA: Diagnosis not present

## 2022-06-06 MED ORDER — GADOPICLENOL 0.5 MMOL/ML IV SOLN
7.5000 mL | Freq: Once | INTRAVENOUS | Status: AC | PRN
  Administered 2022-06-06: 7.5 mL via INTRAVENOUS

## 2022-07-15 DIAGNOSIS — E039 Hypothyroidism, unspecified: Secondary | ICD-10-CM | POA: Diagnosis not present

## 2022-07-15 DIAGNOSIS — E785 Hyperlipidemia, unspecified: Secondary | ICD-10-CM | POA: Diagnosis not present

## 2022-07-30 DIAGNOSIS — L853 Xerosis cutis: Secondary | ICD-10-CM | POA: Diagnosis not present

## 2022-07-30 DIAGNOSIS — R238 Other skin changes: Secondary | ICD-10-CM | POA: Diagnosis not present

## 2022-07-30 DIAGNOSIS — L720 Epidermal cyst: Secondary | ICD-10-CM | POA: Diagnosis not present

## 2022-07-31 DIAGNOSIS — R32 Unspecified urinary incontinence: Secondary | ICD-10-CM | POA: Diagnosis not present

## 2022-07-31 DIAGNOSIS — M810 Age-related osteoporosis without current pathological fracture: Secondary | ICD-10-CM | POA: Diagnosis not present

## 2022-07-31 DIAGNOSIS — K219 Gastro-esophageal reflux disease without esophagitis: Secondary | ICD-10-CM | POA: Diagnosis not present

## 2022-07-31 DIAGNOSIS — Z008 Encounter for other general examination: Secondary | ICD-10-CM | POA: Diagnosis not present

## 2022-07-31 DIAGNOSIS — I10 Essential (primary) hypertension: Secondary | ICD-10-CM | POA: Diagnosis not present

## 2022-07-31 DIAGNOSIS — D6869 Other thrombophilia: Secondary | ICD-10-CM | POA: Diagnosis not present

## 2022-07-31 DIAGNOSIS — J302 Other seasonal allergic rhinitis: Secondary | ICD-10-CM | POA: Diagnosis not present

## 2022-07-31 DIAGNOSIS — E785 Hyperlipidemia, unspecified: Secondary | ICD-10-CM | POA: Diagnosis not present

## 2022-07-31 DIAGNOSIS — Z8249 Family history of ischemic heart disease and other diseases of the circulatory system: Secondary | ICD-10-CM | POA: Diagnosis not present

## 2022-07-31 DIAGNOSIS — Z85828 Personal history of other malignant neoplasm of skin: Secondary | ICD-10-CM | POA: Diagnosis not present

## 2022-07-31 DIAGNOSIS — Z803 Family history of malignant neoplasm of breast: Secondary | ICD-10-CM | POA: Diagnosis not present

## 2022-07-31 DIAGNOSIS — M1990 Unspecified osteoarthritis, unspecified site: Secondary | ICD-10-CM | POA: Diagnosis not present

## 2022-07-31 DIAGNOSIS — I4891 Unspecified atrial fibrillation: Secondary | ICD-10-CM | POA: Diagnosis not present

## 2022-08-06 ENCOUNTER — Telehealth: Payer: Self-pay

## 2022-08-06 DIAGNOSIS — I700 Atherosclerosis of aorta: Secondary | ICD-10-CM | POA: Insufficient documentation

## 2022-08-06 NOTE — Telephone Encounter (Signed)
Patient with diagnosis of afib on Eliquis for anticoagulation.    Procedure: endoscopy/colonoscopy Date of procedure: 08/15/22  CHA2DS2-VASc Score = 5  This indicates a 7.2% annual risk of stroke. The patient's score is based upon: CHF History: 0 HTN History: 1 Diabetes History: 0 Stroke History: 0 Vascular Disease History: 1 Age Score: 2 Gender Score: 1  CrCl 79mL/min  Platelet count 261K  Per office protocol, patient can hold Eliquis for 1-2 days prior to procedure.    **This guidance is not considered finalized until pre-operative APP has relayed final recommendations.**

## 2022-08-06 NOTE — Telephone Encounter (Signed)
   Patient Name: Shelia Obrien  DOB: [birth year] MRN: [id number]  Primary Cardiologist: Nanetta Batty, MD  Clinical pharmacists have reviewed the patient's past medical history, labs, and current medications as part of preoperative protocol coverage. The following recommendations have been made:  Patient with diagnosis of afib on Eliquis for anticoagulation.     Procedure: endoscopy/colonoscopy Date of procedure: 08/15/22   CHA2DS2-VASc Score = 5  This indicates a 7.2% annual risk of stroke. The patient's score is based upon: CHF History: 0 HTN History: 1 Diabetes History: 0 Stroke History: 0 Vascular Disease History: 1 Age Score: 2 Gender Score: 1   CrCl 19mL/min  Platelet count 261K   Per office protocol, patient can hold Eliquis for 1-2 days prior to procedure.  Please resume Eliquis as soon as possible postprocedure, at the discretion of the surgeon.   I will route this recommendation to the requesting party via Epic fax function and remove from pre-op pool.  Please call with questions.  Joylene Grapes, NP 08/06/2022, 3:33 PM

## 2022-08-06 NOTE — Telephone Encounter (Signed)
...     Pre-operative Risk Assessment    Patient Name: Shelia Obrien  DOB: [birth year] MRN: [id number]      Request for Surgical Clearance    Procedure:   ENDOSCOPY/COLONOSCOPY  Date of Surgery:  Clearance 08/15/22                                 Surgeon:  DR Vernia Buff MAGOD Surgeon's Group or Practice Name:  [facility] Phone number:  [phone] Fax number:  [phone]   Type of Clearance Requested:   elquis   Type of Anesthesia:   propofol   Additional requests/questions:    [name], [name]   08/06/2022, 10:34 AM

## 2022-08-15 DIAGNOSIS — Z980 Intestinal bypass and anastomosis status: Secondary | ICD-10-CM | POA: Diagnosis not present

## 2022-08-15 DIAGNOSIS — Z86010 Personal history of colonic polyps: Secondary | ICD-10-CM | POA: Diagnosis not present

## 2022-08-15 DIAGNOSIS — K644 Residual hemorrhoidal skin tags: Secondary | ICD-10-CM | POA: Diagnosis not present

## 2022-08-15 DIAGNOSIS — K5730 Diverticulosis of large intestine without perforation or abscess without bleeding: Secondary | ICD-10-CM | POA: Diagnosis not present

## 2022-08-15 DIAGNOSIS — Z09 Encounter for follow-up examination after completed treatment for conditions other than malignant neoplasm: Secondary | ICD-10-CM | POA: Diagnosis not present

## 2022-08-15 DIAGNOSIS — K640 First degree hemorrhoids: Secondary | ICD-10-CM | POA: Diagnosis not present

## 2022-08-23 DIAGNOSIS — I4891 Unspecified atrial fibrillation: Secondary | ICD-10-CM | POA: Diagnosis not present

## 2022-08-23 DIAGNOSIS — S61421A Laceration with foreign body of right hand, initial encounter: Secondary | ICD-10-CM | POA: Diagnosis not present

## 2022-08-23 DIAGNOSIS — S61411A Laceration without foreign body of right hand, initial encounter: Secondary | ICD-10-CM | POA: Diagnosis not present

## 2022-08-23 DIAGNOSIS — Y92009 Unspecified place in unspecified non-institutional (private) residence as the place of occurrence of the external cause: Secondary | ICD-10-CM | POA: Diagnosis not present

## 2022-08-23 DIAGNOSIS — Z7901 Long term (current) use of anticoagulants: Secondary | ICD-10-CM | POA: Diagnosis not present

## 2022-08-23 DIAGNOSIS — W19XXXA Unspecified fall, initial encounter: Secondary | ICD-10-CM | POA: Diagnosis not present

## 2022-09-01 DIAGNOSIS — M8580 Other specified disorders of bone density and structure, unspecified site: Secondary | ICD-10-CM | POA: Diagnosis not present

## 2022-09-01 DIAGNOSIS — W010XXA Fall on same level from slipping, tripping and stumbling without subsequent striking against object, initial encounter: Secondary | ICD-10-CM | POA: Diagnosis not present

## 2022-09-01 DIAGNOSIS — S61216A Laceration without foreign body of right little finger without damage to nail, initial encounter: Secondary | ICD-10-CM | POA: Diagnosis not present

## 2022-09-01 DIAGNOSIS — I10 Essential (primary) hypertension: Secondary | ICD-10-CM | POA: Diagnosis not present

## 2022-09-01 DIAGNOSIS — I480 Paroxysmal atrial fibrillation: Secondary | ICD-10-CM | POA: Diagnosis not present

## 2022-09-01 DIAGNOSIS — Z4802 Encounter for removal of sutures: Secondary | ICD-10-CM | POA: Diagnosis not present

## 2022-09-29 ENCOUNTER — Ambulatory Visit: Payer: Medicare HMO | Attending: General Practice | Admitting: Cardiovascular Disease

## 2022-09-29 ENCOUNTER — Encounter: Payer: Self-pay | Admitting: Cardiovascular Disease

## 2022-09-29 VITALS — BP 110/72 | HR 73 | Ht 63.0 in | Wt 155.8 lb

## 2022-09-29 DIAGNOSIS — E785 Hyperlipidemia, unspecified: Secondary | ICD-10-CM

## 2022-09-29 DIAGNOSIS — I700 Atherosclerosis of aorta: Secondary | ICD-10-CM

## 2022-09-29 DIAGNOSIS — I10 Essential (primary) hypertension: Secondary | ICD-10-CM | POA: Diagnosis not present

## 2022-09-29 DIAGNOSIS — I480 Paroxysmal atrial fibrillation: Secondary | ICD-10-CM

## 2022-09-29 NOTE — Assessment & Plan Note (Signed)
History of essential hypertension blood pressure measured today at 110/72.  She is on losartan and metoprolol.

## 2022-09-29 NOTE — Patient Instructions (Signed)
    Follow-Up: At [facility], you and your health needs are our priority.  As part of our continuing mission to provide you with exceptional heart care, we have created designated Provider Care Teams.  These Care Teams include your primary Cardiologist (physician) and Advanced Practice Providers (APPs -  Physician Assistants and Nurse Practitioners) who all work together to provide you with the care you need, when you need it.  We recommend signing up for the patient portal called "MyChart".  Sign up information is provided on this After Visit Summary.  MyChart is used to connect with patients for Virtual Visits (Telemedicine).  Patients are able to view lab/test results, encounter notes, upcoming appointments, etc.  Non-urgent messages can be sent to your provider as well.   To learn more about what you can do with MyChart, go to [email or web address].    Your next appointment:   12 month(s)  Provider:   Jonathan Berry, MD     

## 2022-09-29 NOTE — Assessment & Plan Note (Signed)
History of dyslipidemia on low-dose statin therapy with lipid profile followed by her PCP last performed 03/22/2021 revealing a total cholesterol 139, LDL 74 and HDL 39.

## 2022-09-29 NOTE — Assessment & Plan Note (Signed)
History of PAF maintaining sinus rhythm on Eliquis oral anticoagulation. 

## 2022-09-29 NOTE — Progress Notes (Signed)
09/29/2022 Shelia Obrien   May 05, 1940  [id number]  Primary Physician Rodrigo Ran, MD Primary Cardiologist: Runell Gess MD Nicholes Calamity, [place]  HPI:  Shelia Obrien is a 82 y.o.   mildly overweight widowed Caucasian female mother of 2 children, grandmother of 2 grandchildren referred by Dr. Waynard Edwards for evaluation of tachycardia.   She is accompanied by her daughter Shelia Obrien today.  She is retired from working at [employer] for 20 years, [name] and [employer] for 26 years.  I last saw her in the office 08/30/2021. She has a history of treated hypertension hyperlipidemia.  She is a family history of heart disease with a brother who had bypass surgery and a sister who has had stents.  She is never had a heart attack or stroke.  She denies chest pain or shortness of breath.  She has had breast cancer with right breast lumpectomy, chemotherapy and radiation therapy.  She had an episode of sustained elevated heart rate in the 160-170 range on 09/09/2019 for unclear reasons.  She did feel poorly that day.  She has had episodes of syncope in the past for unclear reasons and has mentioned episodes of dizziness as well.  I did perform 2D echocardiography which was essentially normal and an event monitor which showed episodes of PAF after which I began her on Eliquis oral anticoagulation and Toprol-XL 25 mg a day.   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  Since I saw her a year ago she continues to do well.  She has atypical chest pain when first getting up in the morning which quickly resolves.  This does not sound anginal.   Current Meds  Medication Sig   acetaminophen (TYLENOL) 500 MG tablet Take 1,000 mg by mouth every 8 (eight) hours as needed for moderate pain.   acidophilus (RISAQUAD) CAPS capsule Take 1 capsule by mouth daily.   apixaban (ELIQUIS) 5 MG TABS tablet Take 1 tablet (5 mg total) by mouth 2  (two) times daily.   Ascorbic Acid (VITAMIN C) 1000 MG tablet Take 1,000 mg by mouth daily.   b complex vitamins tablet Take 1 tablet by mouth daily.   calcium carbonate (OSCAL) 1500 (600 Ca) MG TABS tablet Take 600 mg by mouth in the morning and at bedtime.   cetirizine (ZYRTEC) 10 MG tablet Take 10 mg by mouth daily.   Cholecalciferol (VITAMIN D) 50 MCG (2000 UT) tablet Take 1 tablet (2,000 Units total) by mouth 3 (three) times a week. (Patient taking differently: Take 2,000 Units by mouth every Monday, Wednesday, and Friday.)   denosumab (PROLIA) 60 MG/ML SOSY injection Inject 60 mg into the skin every 6 (six) months.   famotidine (PEPCID) 20 MG tablet Take 1 tablet (20 mg total) by mouth 2 (two) times daily.   fluticasone (FLONASE) 50 MCG/ACT nasal spray Place 1 spray into both nostrils daily as needed for allergies.   levothyroxine (SYNTHROID, LEVOTHROID) 75 MCG tablet Take 37.5-75 mcg by mouth See admin instructions. Take 75 mcg by mouth daily except on Friday take 37.5 mcg   losartan (COZAAR) 25 MG tablet Take 25 mg by mouth daily.   metoprolol succinate (TOPROL XL) 25 MG 24 hr tablet Take 1 tablet (25 mg total) by mouth daily.   Multiple Vitamins-Minerals (HAIR/SKIN/NAILS) TABS Take 1 tablet by mouth daily.   rosuvastatin (CRESTOR) 10 MG tablet Take 10 mg by mouth daily.  TURMERIC PO Take 1,000 mg by mouth daily.   valACYclovir (VALTREX) 1000 MG tablet Take 1,000 mg by mouth 2 (two) times daily as needed (for cold sores).     No Known Allergies  Social History   Socioeconomic History   Marital status: Widowed    Spouse name: Not on file   Number of children: Not on file   Years of education: Not on file   Highest education level: Not on file  Occupational History   Not on file  Tobacco Use   Smoking status: Never   Smokeless tobacco: Never  Vaping Use   Vaping Use: Never used  Substance and Sexual Activity   Alcohol use: No   Drug use: No   Sexual activity: Not  Currently    Comment: menarche 89, P 2, HRT 14 + yrs  Other Topics Concern   Not on file  Social History Narrative   Not on file   Social Determinants of Health   Financial Resource Strain: Not on file  Food Insecurity: Not on file  Transportation Needs: Not on file  Physical Activity: Not on file  Stress: Not on file  Social Connections: Not on file  Intimate Partner Violence: Not on file     Review of Systems: General: negative for chills, fever, night sweats or weight changes.  Cardiovascular: negative for chest pain, dyspnea on exertion, edema, orthopnea, palpitations, paroxysmal nocturnal dyspnea or shortness of breath Dermatological: negative for rash Respiratory: negative for cough or wheezing Urologic: negative for hematuria Abdominal: negative for nausea, vomiting, diarrhea, bright red blood per rectum, melena, or hematemesis Neurologic: negative for visual changes, syncope, or dizziness All other systems reviewed and are otherwise negative except as noted above.    Blood pressure 110/72, pulse 73, height 5\' 3"  (1.6 m), weight 155 lb 12.8 oz (70.7 kg), SpO2 97 %.  General appearance: alert and no distress Neck: no adenopathy, no carotid bruit, no JVD, supple, symmetrical, trachea midline, and thyroid not enlarged, symmetric, no tenderness/mass/nodules Lungs: clear to auscultation bilaterally Heart: regular rate and rhythm, S1, S2 normal, no murmur, click, rub or gallop Extremities: extremities normal, atraumatic, no cyanosis or edema Pulses: 2+ and symmetric Skin: Skin color, texture, turgor normal. No rashes or lesions Neurologic: Grossly normal  EKG sinus rhythm at 73 with low limb voltage and septal Q waves.  I personally reviewed this EKG.   ASSESSMENT AND PLAN:   Dyslipidemia History of dyslipidemia on low-dose statin therapy with lipid profile followed by her PCP last performed 03/22/2021 revealing a total cholesterol 139, LDL 74 and HDL 39.  Essential  hypertension History of essential hypertension blood pressure measured today at 110/72.  She is on losartan and metoprolol.  PAF (paroxysmal atrial fibrillation) (HCC) History of PAF maintaining sinus rhythm on Eliquis oral anticoagulation.     Runell Gess MD FACP,FACC,FAHA, [place] 09/29/2022 3:28 PM

## 2022-10-10 ENCOUNTER — Ambulatory Visit: Payer: Medicare HMO | Admitting: General Practice

## 2022-11-10 ENCOUNTER — Other Ambulatory Visit (HOSPITAL_COMMUNITY): Payer: Self-pay | Admitting: *Deleted

## 2022-11-10 DIAGNOSIS — H5010 Unspecified exotropia: Secondary | ICD-10-CM | POA: Diagnosis not present

## 2022-11-10 DIAGNOSIS — H52203 Unspecified astigmatism, bilateral: Secondary | ICD-10-CM | POA: Diagnosis not present

## 2022-11-10 DIAGNOSIS — Z961 Presence of intraocular lens: Secondary | ICD-10-CM | POA: Diagnosis not present

## 2022-11-10 DIAGNOSIS — H04123 Dry eye syndrome of bilateral lacrimal glands: Secondary | ICD-10-CM | POA: Diagnosis not present

## 2022-11-13 ENCOUNTER — Ambulatory Visit (HOSPITAL_COMMUNITY)
Admission: RE | Admit: 2022-11-13 | Discharge: 2022-11-13 | Disposition: A | Discharge status: Home / Self Care | Payer: Medicare HMO | Source: Ambulatory Visit | Attending: Internal Medicine | Admitting: Internal Medicine

## 2022-11-13 DIAGNOSIS — M810 Age-related osteoporosis without current pathological fracture: Secondary | ICD-10-CM | POA: Diagnosis not present

## 2022-11-13 MED ORDER — DENOSUMAB 60 MG/ML ~~LOC~~ SOSY
PREFILLED_SYRINGE | SUBCUTANEOUS | Status: AC
  Filled 2022-11-13: qty 1

## 2022-11-13 MED ORDER — DENOSUMAB 60 MG/ML ~~LOC~~ SOSY
60.0000 mg | PREFILLED_SYRINGE | Freq: Once | SUBCUTANEOUS | Status: AC
  Administered 2022-11-13: 60 mg via SUBCUTANEOUS

## 2022-11-24 DIAGNOSIS — Z0100 Encounter for examination of eyes and vision without abnormal findings: Secondary | ICD-10-CM | POA: Diagnosis not present

## 2023-01-21 DIAGNOSIS — Z1231 Encounter for screening mammogram for malignant neoplasm of breast: Secondary | ICD-10-CM | POA: Diagnosis not present

## 2023-01-23 DIAGNOSIS — R7301 Impaired fasting glucose: Secondary | ICD-10-CM | POA: Diagnosis not present

## 2023-01-23 DIAGNOSIS — E039 Hypothyroidism, unspecified: Secondary | ICD-10-CM | POA: Diagnosis not present

## 2023-01-23 DIAGNOSIS — I10 Essential (primary) hypertension: Secondary | ICD-10-CM | POA: Diagnosis not present

## 2023-02-24 DIAGNOSIS — D225 Melanocytic nevi of trunk: Secondary | ICD-10-CM | POA: Diagnosis not present

## 2023-02-24 DIAGNOSIS — Z08 Encounter for follow-up examination after completed treatment for malignant neoplasm: Secondary | ICD-10-CM | POA: Diagnosis not present

## 2023-02-24 DIAGNOSIS — L821 Other seborrheic keratosis: Secondary | ICD-10-CM | POA: Diagnosis not present

## 2023-02-24 DIAGNOSIS — L570 Actinic keratosis: Secondary | ICD-10-CM | POA: Diagnosis not present

## 2023-02-24 DIAGNOSIS — Z85828 Personal history of other malignant neoplasm of skin: Secondary | ICD-10-CM | POA: Diagnosis not present

## 2023-02-24 DIAGNOSIS — L814 Other melanin hyperpigmentation: Secondary | ICD-10-CM | POA: Diagnosis not present

## 2023-02-24 DIAGNOSIS — L720 Epidermal cyst: Secondary | ICD-10-CM | POA: Diagnosis not present

## 2023-04-06 DIAGNOSIS — L648 Other androgenic alopecia: Secondary | ICD-10-CM | POA: Diagnosis not present

## 2023-04-06 DIAGNOSIS — L650 Telogen effluvium: Secondary | ICD-10-CM | POA: Diagnosis not present

## 2023-07-10 DIAGNOSIS — E785 Hyperlipidemia, unspecified: Secondary | ICD-10-CM | POA: Diagnosis not present

## 2023-07-10 DIAGNOSIS — N1831 Chronic kidney disease, stage 3a: Secondary | ICD-10-CM | POA: Diagnosis not present

## 2023-07-10 DIAGNOSIS — E039 Hypothyroidism, unspecified: Secondary | ICD-10-CM | POA: Diagnosis not present

## 2023-07-10 DIAGNOSIS — E559 Vitamin D deficiency, unspecified: Secondary | ICD-10-CM | POA: Diagnosis not present

## 2023-07-10 DIAGNOSIS — I129 Hypertensive chronic kidney disease with stage 1 through stage 4 chronic kidney disease, or unspecified chronic kidney disease: Secondary | ICD-10-CM | POA: Diagnosis not present

## 2023-07-10 DIAGNOSIS — R7301 Impaired fasting glucose: Secondary | ICD-10-CM | POA: Diagnosis not present

## 2023-07-24 DIAGNOSIS — C50919 Malignant neoplasm of unspecified site of unspecified female breast: Secondary | ICD-10-CM | POA: Diagnosis not present

## 2023-07-24 DIAGNOSIS — I129 Hypertensive chronic kidney disease with stage 1 through stage 4 chronic kidney disease, or unspecified chronic kidney disease: Secondary | ICD-10-CM | POA: Diagnosis not present

## 2023-07-24 DIAGNOSIS — M8580 Other specified disorders of bone density and structure, unspecified site: Secondary | ICD-10-CM | POA: Diagnosis not present

## 2023-07-24 DIAGNOSIS — R82998 Other abnormal findings in urine: Secondary | ICD-10-CM | POA: Diagnosis not present

## 2023-07-24 DIAGNOSIS — E785 Hyperlipidemia, unspecified: Secondary | ICD-10-CM | POA: Diagnosis not present

## 2023-07-24 DIAGNOSIS — Z0000 Encounter for general adult medical examination without abnormal findings: Secondary | ICD-10-CM | POA: Diagnosis not present

## 2023-07-24 DIAGNOSIS — E039 Hypothyroidism, unspecified: Secondary | ICD-10-CM | POA: Diagnosis not present

## 2023-07-24 DIAGNOSIS — G3184 Mild cognitive impairment, so stated: Secondary | ICD-10-CM | POA: Diagnosis not present

## 2023-07-24 DIAGNOSIS — N1831 Chronic kidney disease, stage 3a: Secondary | ICD-10-CM | POA: Diagnosis not present

## 2023-07-24 DIAGNOSIS — J45998 Other asthma: Secondary | ICD-10-CM | POA: Diagnosis not present

## 2023-07-24 DIAGNOSIS — C182 Malignant neoplasm of ascending colon: Secondary | ICD-10-CM | POA: Diagnosis not present

## 2023-07-24 DIAGNOSIS — I480 Paroxysmal atrial fibrillation: Secondary | ICD-10-CM | POA: Diagnosis not present

## 2023-07-24 DIAGNOSIS — C44519 Basal cell carcinoma of skin of other part of trunk: Secondary | ICD-10-CM | POA: Diagnosis not present

## 2023-08-11 ENCOUNTER — Other Ambulatory Visit (HOSPITAL_COMMUNITY): Payer: Self-pay | Admitting: *Deleted

## 2023-08-13 ENCOUNTER — Ambulatory Visit (HOSPITAL_COMMUNITY)
Admission: RE | Admit: 2023-08-13 | Discharge: 2023-08-13 | Disposition: A | Discharge status: Home / Self Care | Source: Ambulatory Visit | Attending: Internal Medicine | Admitting: Internal Medicine

## 2023-08-13 DIAGNOSIS — M810 Age-related osteoporosis without current pathological fracture: Secondary | ICD-10-CM | POA: Diagnosis not present

## 2023-08-13 MED ORDER — DENOSUMAB 60 MG/ML ~~LOC~~ SOSY
PREFILLED_SYRINGE | SUBCUTANEOUS | Status: AC
  Filled 2023-08-13: qty 1

## 2023-08-13 MED ORDER — DENOSUMAB 60 MG/ML ~~LOC~~ SOSY
60.0000 mg | PREFILLED_SYRINGE | Freq: Once | SUBCUTANEOUS | Status: AC
  Administered 2023-08-13: 60 mg via SUBCUTANEOUS

## 2023-09-28 ENCOUNTER — Ambulatory Visit: Payer: Medicare HMO | Attending: Cardiovascular Disease | Admitting: Cardiovascular Disease

## 2023-09-28 ENCOUNTER — Encounter: Payer: Self-pay | Admitting: Cardiovascular Disease

## 2023-09-28 VITALS — BP 129/62 | HR 59 | Ht 63.0 in | Wt 152.0 lb

## 2023-09-28 DIAGNOSIS — I480 Paroxysmal atrial fibrillation: Secondary | ICD-10-CM

## 2023-09-28 DIAGNOSIS — I10 Essential (primary) hypertension: Secondary | ICD-10-CM | POA: Diagnosis not present

## 2023-09-28 DIAGNOSIS — E785 Hyperlipidemia, unspecified: Secondary | ICD-10-CM

## 2023-09-28 NOTE — Assessment & Plan Note (Signed)
 History of dyslipidemia on statin therapy with lipid profile performed 07/10/2023 revealing total cholesterol 138, LDL 59 HDL 45.

## 2023-09-28 NOTE — Patient Instructions (Signed)

## 2023-09-28 NOTE — Progress Notes (Signed)
 09/28/2023 Shelia Obrien   1941-01-22  [id number]  Primary Physician Aldo Hun, MD Primary Cardiologist: Avanell Leigh MD Bennye Bravo, [place]  HPI:  Shelia Obrien is a 83 y.o.  mildly overweight widowed Caucasian female mother of 2 children, grandmother of 2 grandchildren referred by Dr. Genelle Kennedy for evaluation of tachycardia.   She is accompanied by her daughter Shelia Obrien today.  She is retired from working at [employer] for 20 years, [employer] and [employer] for 26 years.  I last saw her in the office 09/29/2022. She has a history of treated hypertension hyperlipidemia.  She is a family history of heart disease with a brother who had bypass surgery and a sister who has had stents.  She is never had a heart attack or stroke.  She denies chest pain or shortness of breath.  She has had breast cancer with right breast lumpectomy, chemotherapy and radiation therapy.  She had an episode of sustained elevated heart rate in the 160-170 range on 09/09/2019 for unclear reasons.  She did feel poorly that day.  She has had episodes of syncope in the past for unclear reasons and has mentioned episodes of dizziness as well.  I did perform 2D echocardiography which was essentially normal and an event monitor which showed episodes of PAF after which I began her on Eliquis  oral anticoagulation and Toprol -XL 25 mg a day.   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   Since I saw her a year ago she continues to do well.  She is independent, lives alone and still drives short distances.  She denies chest pain or shortness of breath.   Current Meds  Medication Sig   acetaminophen  (TYLENOL ) 500 MG tablet Take 1,000 mg by mouth every 8 (eight) hours as needed for moderate pain.   apixaban  (ELIQUIS ) 5 MG TABS tablet Take 1 tablet (5 mg total) by mouth 2 (two) times daily.   Ascorbic Acid  (VITAMIN C) 1000 MG tablet Take 1,000 mg by  mouth daily.   b complex vitamins tablet Take 1 tablet by mouth daily.   calcium  carbonate (OSCAL) 1500 (600 Ca) MG TABS tablet Take 600 mg by mouth in the morning and at bedtime.   cetirizine (ZYRTEC) 10 MG tablet Take 10 mg by mouth daily.   Cholecalciferol  (VITAMIN D ) 50 MCG (2000 UT) tablet Take 1 tablet (2,000 Units total) by mouth 3 (three) times a week.   denosumab  (PROLIA ) 60 MG/ML SOSY injection Inject 60 mg into the skin every 6 (six) months.   famotidine  (PEPCID ) 20 MG tablet Take 1 tablet (20 mg total) by mouth 2 (two) times daily.   fluticasone  (FLONASE ) 50 MCG/ACT nasal spray Place 1 spray into both nostrils daily as needed for allergies.   levothyroxine  (SYNTHROID , LEVOTHROID) 75 MCG tablet Take 37.5-75 mcg by mouth See admin instructions. Take 75 mcg by mouth daily except on Friday take 37.5 mcg   losartan  (COZAAR ) 25 MG tablet Take 25 mg by mouth daily.   metoprolol  succinate (TOPROL  XL) 25 MG 24 hr tablet Take 1 tablet (25 mg total) by mouth daily.   Multiple Vitamins-Minerals (HAIR/SKIN/NAILS) TABS Take 1 tablet by mouth daily.   mupirocin ointment (BACTROBAN) 2 % Apply 1 Application topically as needed.   rosuvastatin  (CRESTOR ) 10 MG tablet Take 10 mg by mouth daily.    valACYclovir  (VALTREX ) 1000 MG tablet Take 1,000 mg by mouth 2 (  two) times daily as needed (for cold sores).     No Known Allergies  Social History   Socioeconomic History   Marital status: Widowed    Spouse name: Not on file   Number of children: Not on file   Years of education: Not on file   Highest education level: Not on file  Occupational History   Not on file  Tobacco Use   Smoking status: Never   Smokeless tobacco: Never  Vaping Use   Vaping status: Never Used  Substance and Sexual Activity   Alcohol  use: No   Drug use: No   Sexual activity: Not Currently    Comment: menarche 58, P 2, HRT 14 + yrs  Other Topics Concern   Not on file  Social History Narrative   Not on file    Social Drivers of Health   Financial Resource Strain: Not on file  Food Insecurity: Not on file  Transportation Needs: Not on file  Physical Activity: Not on file  Stress: Not on file  Social Connections: Unknown (08/23/2022)   Received from [facility]   Social Network    Social Network: Not on file  Intimate Partner Violence: Not At Risk (08/23/2022)   Received from [facility]   HITS    Over the last 12 months how often did your partner physically hurt you?: Never    Over the last 12 months how often did your partner insult you or talk down to you?: Never    Over the last 12 months how often did your partner threaten you with physical harm?: Never    Over the last 12 months how often did your partner scream or curse at you?: Never     Review of Systems: General: negative for chills, fever, night sweats or weight changes.  Cardiovascular: negative for chest pain, dyspnea on exertion, edema, orthopnea, palpitations, paroxysmal nocturnal dyspnea or shortness of breath Dermatological: negative for rash Respiratory: negative for cough or wheezing Urologic: negative for hematuria Abdominal: negative for nausea, vomiting, diarrhea, bright red blood per rectum, melena, or hematemesis Neurologic: negative for visual changes, syncope, or dizziness All other systems reviewed and are otherwise negative except as noted above.    Blood pressure 129/62, pulse (!) 59, height 5' 3 (1.6 m), weight 152 lb (68.9 kg), SpO2 97%.  General appearance: alert and no distress Neck: no adenopathy, no carotid bruit, no JVD, supple, symmetrical, trachea midline, and thyroid  not enlarged, symmetric, no tenderness/mass/nodules Lungs: clear to auscultation bilaterally Heart: regular rate and rhythm, S1, S2 normal, no murmur, click, rub or gallop Extremities: extremities normal, atraumatic, no cyanosis or edema Pulses: 2+ and symmetric Skin: Skin color, texture, turgor normal. No rashes or  lesions Neurologic: Grossly normal  EKG EKG Interpretation Date/Time:  Monday September 28 2023 09:50:06 EDT Ventricular Rate:  59 PR Interval:  166 QRS Duration:  68 QT Interval:  404 QTC Calculation: 399 R Axis:   54  Text Interpretation: Sinus bradycardia with sinus arrhythmia Low voltage QRS Cannot rule out Anterior infarct , age undetermined Confirmed by Lauro Portal [phone]) on 09/28/2023 9:56:30 AM    ASSESSMENT AND PLAN:   Dyslipidemia History of dyslipidemia on statin therapy with lipid profile performed 07/10/2023 revealing total cholesterol 138, LDL 59 HDL 45.  Essential hypertension History of essential hypertension with blood pressure measured today 129/62.  She is on losartan .  PAF (paroxysmal atrial fibrillation) (HCC) History of PAF maintaining sinus rhythm on Eliquis  oral anticoagulation.  Avanell Leigh MD FACP,FACC,FAHA, [facility] 09/28/2023 10:05 AM

## 2023-09-28 NOTE — Assessment & Plan Note (Signed)
 History of PAF maintaining sinus rhythm on Eliquis oral anticoagulation.

## 2023-09-28 NOTE — Assessment & Plan Note (Signed)
 History of essential hypertension with blood pressure measured today 129/62.  She is on losartan .

## 2023-11-13 DIAGNOSIS — H52203 Unspecified astigmatism, bilateral: Secondary | ICD-10-CM | POA: Diagnosis not present

## 2023-11-13 DIAGNOSIS — H04123 Dry eye syndrome of bilateral lacrimal glands: Secondary | ICD-10-CM | POA: Diagnosis not present

## 2023-11-13 DIAGNOSIS — Z961 Presence of intraocular lens: Secondary | ICD-10-CM | POA: Diagnosis not present

## 2023-12-29 DIAGNOSIS — Z23 Encounter for immunization: Secondary | ICD-10-CM | POA: Diagnosis not present

## 2024-01-29 DIAGNOSIS — Z1231 Encounter for screening mammogram for malignant neoplasm of breast: Secondary | ICD-10-CM | POA: Diagnosis not present

## 2024-05-03 ENCOUNTER — Other Ambulatory Visit (HOSPITAL_COMMUNITY): Payer: Self-pay | Admitting: Internal Medicine

## 2024-05-03 ENCOUNTER — Telehealth (HOSPITAL_COMMUNITY): Payer: Self-pay | Admitting: Pharmacy Technician

## 2024-05-03 DIAGNOSIS — M810 Age-related osteoporosis without current pathological fracture: Secondary | ICD-10-CM | POA: Insufficient documentation

## 2024-05-03 NOTE — Telephone Encounter (Signed)
 NOTE: Per [facility] practice, MD approved biosimilars switch for Prolia  per payor preferences.  Auth Submission: APPROVED Site of care: [facility] Payer: [employer] MEDICARE Medication & CPT/J Code(s) submitted: Jubbonti (denosumab -bbdz) V4863 Diagnosis Code: M81.0 Route of submission (phone, fax, portal): [facility] Phone # Fax # Auth type: Buy/Bill HB Units/visits requested: 60mg  x 2 doses, q 6 months Auth number: [id number] CASE: [id number] Approval from: 05/03/24 to 05/03/25    Dagoberto Armour, CPhT [facility] Phone: [phone] 05/03/2024

## 2024-05-03 NOTE — Progress Notes (Signed)
 NOTE: Per [facility] practice, MD approved biosimilars switch for Prolia per payor preferences.

## 2024-05-03 NOTE — Progress Notes (Signed)
 Received Prolia  referral. Insurance prefers Jubbonti. Fax sent to Dr. Sheppard office for order update  Calcium  wnl on 04/22/2024 (attached to referral)  Sherry Pennant, PharmD, MPH, BCPS, CPP Clinical Pharmacist

## 2024-05-20 ENCOUNTER — Inpatient Hospital Stay (HOSPITAL_COMMUNITY): Admission: RE | Admit: 2024-05-20 | Source: Ambulatory Visit

## 2024-05-20 VITALS — BP 126/70 | HR 75 | Temp 97.4°F | Resp 17

## 2024-05-20 DIAGNOSIS — M810 Age-related osteoporosis without current pathological fracture: Secondary | ICD-10-CM

## 2024-05-20 MED ORDER — DENOSUMAB-BBDZ 60 MG/ML ~~LOC~~ SOSY
PREFILLED_SYRINGE | SUBCUTANEOUS | Status: AC
  Filled 2024-05-20: qty 1

## 2024-05-20 MED ORDER — DENOSUMAB-BBDZ 60 MG/ML ~~LOC~~ SOSY
60.0000 mg | PREFILLED_SYRINGE | Freq: Once | SUBCUTANEOUS | Status: AC
  Administered 2024-05-20: 60 mg via SUBCUTANEOUS

## 2024-09-28 ENCOUNTER — Ambulatory Visit: Admitting: Cardiovascular Disease

## 2024-11-18 ENCOUNTER — Encounter (HOSPITAL_COMMUNITY)
